# Patient Record
Sex: Male | Born: 1940 | Race: White | Hispanic: No | Marital: Married | Smoking: Former smoker
Health system: Southern US, Community
[De-identification: ages and names within clinical notes are randomized; demographics above are authoritative.]

## PROBLEM LIST (undated history)

## (undated) DIAGNOSIS — M1990 Unspecified osteoarthritis, unspecified site: Secondary | ICD-10-CM

## (undated) DIAGNOSIS — J189 Pneumonia, unspecified organism: Secondary | ICD-10-CM

## (undated) DIAGNOSIS — E119 Type 2 diabetes mellitus without complications: Secondary | ICD-10-CM

## (undated) DIAGNOSIS — I10 Essential (primary) hypertension: Secondary | ICD-10-CM

## (undated) DIAGNOSIS — M1612 Unilateral primary osteoarthritis, left hip: Secondary | ICD-10-CM

## (undated) DIAGNOSIS — M26609 Unspecified temporomandibular joint disorder, unspecified side: Secondary | ICD-10-CM

## (undated) HISTORY — PX: ROTATOR CUFF REPAIR: SHX139

## (undated) HISTORY — PX: OTHER SURGICAL HISTORY: SHX169

---
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
## 1998-01-18 ENCOUNTER — Ambulatory Visit (HOSPITAL_BASED_OUTPATIENT_CLINIC_OR_DEPARTMENT_OTHER): Admission: RE | Admit: 1998-01-18 | Discharge: 1998-01-18 | Payer: Self-pay | Admitting: Orthopedic Surgery

## 1998-02-06 ENCOUNTER — Inpatient Hospital Stay (HOSPITAL_COMMUNITY): Admission: EM | Admit: 1998-02-06 | Discharge: 1998-02-08 | Payer: Self-pay | Admitting: Emergency Medicine

## 1999-11-16 ENCOUNTER — Ambulatory Visit (HOSPITAL_COMMUNITY): Admission: RE | Admit: 1999-11-16 | Discharge: 1999-11-16 | Payer: Self-pay | Admitting: *Deleted

## 2002-08-22 ENCOUNTER — Emergency Department (HOSPITAL_COMMUNITY): Admission: EM | Admit: 2002-08-22 | Discharge: 2002-08-22 | Payer: Self-pay | Admitting: Emergency Medicine

## 2004-11-07 ENCOUNTER — Ambulatory Visit (HOSPITAL_COMMUNITY): Admission: RE | Admit: 2004-11-07 | Discharge: 2004-11-07 | Payer: Self-pay | Admitting: Orthopedic Surgery

## 2005-01-09 ENCOUNTER — Ambulatory Visit (HOSPITAL_BASED_OUTPATIENT_CLINIC_OR_DEPARTMENT_OTHER): Admission: RE | Admit: 2005-01-09 | Discharge: 2005-01-09 | Payer: Self-pay | Admitting: Orthopedic Surgery

## 2008-10-18 ENCOUNTER — Ambulatory Visit (HOSPITAL_COMMUNITY): Admission: RE | Admit: 2008-10-18 | Discharge: 2008-10-18 | Payer: Self-pay | Admitting: *Deleted

## 2008-10-18 ENCOUNTER — Encounter (INDEPENDENT_AMBULATORY_CARE_PROVIDER_SITE_OTHER): Payer: Self-pay | Admitting: *Deleted

## 2010-01-24 ENCOUNTER — Encounter: Admission: RE | Admit: 2010-01-24 | Discharge: 2010-01-24 | Payer: Self-pay | Admitting: Internal Medicine

## 2010-04-04 ENCOUNTER — Ambulatory Visit (HOSPITAL_BASED_OUTPATIENT_CLINIC_OR_DEPARTMENT_OTHER): Admission: RE | Admit: 2010-04-04 | Discharge: 2010-04-04 | Payer: Self-pay | Admitting: Urology

## 2010-11-10 LAB — POCT I-STAT 4, (NA,K, GLUC, HGB,HCT)
Glucose, Bld: 168 mg/dL — ABNORMAL HIGH (ref 70–99)
Hemoglobin: 14.6 g/dL (ref 13.0–17.0)

## 2010-12-12 LAB — GLUCOSE, CAPILLARY: Glucose-Capillary: 152 mg/dL — ABNORMAL HIGH (ref 70–99)

## 2011-01-09 NOTE — Op Note (Signed)
NAMEAVID, Eduardo Murray              ACCOUNT NO.:  [id number]   MEDICAL RECORD NO.:  [id number]          PATIENT TYPE:  AMB   LOCATION:  ENDO                         FACILITY:  [facility]   PHYSICIAN:  Georgiana Spinner, M.D.    DATE OF BIRTH:  [birth year]   DATE OF PROCEDURE:  10/18/2008  DATE OF DISCHARGE:                               OPERATIVE REPORT   PROCEDURE:  Upper endoscopy with biopsy.   INDICATIONS:  Abdominal pain.   ANESTHESIA:  Fentanyl 75 mcg, Versed 5 mg.   PROCEDURE:  With the patient mildly sedated in the left lateral  decubitus position, the Pentax videoscopic endoscope was inserted into  the mouth and passed under direct vision through the esophagus; which  appeared normal until we reached distal esophagus. There were changes of  esophagitis, with at least 3 linear ulcers with whitish bases and  surrounding erythema.  As we went further there appeared there might be  Barrett's esophagus distally.  Photographs and multiple biopsies were  taken.  We entered into the stomach; fundus, body, antrum, duodenal  bulb, second portion of the duodenum were visualized.  From this point  the endoscope was slowly withdrawn, taking circumferential views of the  duodenal mucosa, until the endoscope had been pulled back and the  stomach placed in retroflexion to view the stomach from below.  The  endoscope was straightened and withdrawn, taking circumferential views  of the remaining gastric and esophageal mucosa.  The patient's vital  signs and pulse oximetry remained stable.  The patient tolerated the  procedure well without apparent complications.   FINDINGS:  Fairly severe esophagitis, as photographed and biopsied.  A  question of Barrett's esophagus present as well.   PLAN:  Continue the patient on PPI therapy.  Does register that there  has been some improvement in his symptomatology, and await biopsy  report.  The patient will call me for results and follow-up with me as  an  outpatient.           ______________________________  Georgiana Spinner, M.D.     GMO/MEDQ  D:  10/18/2008  T:  10/18/2008  Job:  [id number]

## 2011-01-12 NOTE — Procedures (Signed)
[place]  Patient:    JAYVON, MOUNGER                     MRN: [id number] Proc. Date: 11/16/99 Adm. Date:  [id number] Attending:  Sabino Gasser                           Procedure Report  PROCEDURE PERFORMED:  Colonoscopy.  ENDOSCOPIST:  Sabino Gasser, M.D.  INDICATIONS FOR PROCEDURE:  Mr. Ivey is a 70 year old gentleman undergoing colonoscopy for follow-up of colon polyp disease.  ANESTHESIA:  Demerol 100 mg, Versed 10 mg was given intravenously in divided dose.  DESCRIPTION OF PROCEDURE:  With the patient mildly sedated in the left lateral decubitus position, rolled onto his back to attain the cecum, the Olympus videoscopic colonoscope was inserted in the rectum after normal rectal exam and  passed under direct vision into the cecum.  The cecum was identified by the ileocecal valve and appendiceal orifice, both of which were photographed.  From  this point, the colonoscope was slowly withdrawn, taking circumferential views f the entire colonic mucosa stopping only in the rectum which appeared normal on direct and retroflex view.  The endoscope was straightened and withdrawn. Patients vital signs and pulse oximeter remained stable.  The patient tolerated  the procedure well and without apparent complications.  FINDINGS: Essentially negative colonoscopic examination to the cecum.  PLAN:  Repeat examination in approximately five years. DD:  11/16/99 TD:  11/16/99 Job: [id number]

## 2011-01-12 NOTE — Op Note (Signed)
Eduardo Murray, Eduardo Murray              ACCOUNT NO.:  [id number]   MEDICAL RECORD NO.:  [id number]          PATIENT TYPE:  AMB   LOCATION:  DSC                          FACILITY:  [facility]   PHYSICIAN:  Katy Fitch. Sypher, M.D. 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   POSTOPERATIVE DIAGNOSIS:  1.  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  2.  Arthroscopic debridement of adhesive capsulitis with anterior      capsulectomy and debridement of granulation tissues.  3.  Arthroscopic debridement of partial-thickness rotator cuff tear      identified to be at least 75 to 80% thickness of the anterior      supraspinatus posterior to the rotator interval.  4.  Significant AC degenerative arthritis with chronic sub AC joint      impingement with open resection of medial acromion and open distal      clavicle resection i.e. Mumford procedure.  5.  Open reconstruction of the supraspinatus 75% deep surface rotator cuff      tear.   SURGEON:  Katy Fitch. Sypher, M.D.   ASSISTANT:  Jonni Sanger, P.A.   ANESTHESIA:  General endotracheal, supervising anesthesiologist is Sheldon Silvan, M.D. Note that Dr. Ivin Booty placed a preoperative interscalene block for  postoperative analgesia.   INDICATIONS:  Eduardo Murray is a 70 year old well-known patient with a  history of chronic left shoulder pain. Clinical examination revealed signs  of chronic stage II or III impingement, AC arthropathy and plain film  evidence of a probable rotator cuff tear.   An MRI of the shoulder documented significant AC arthropathy, a prominent  anterior medial  acromion, and signs of a 75% plus rotator cuff tear on the  deep surface of the supraspinatus with retraction of the deep fibers.   Due to chronic pain and a failure to improve with a therapy, Eduardo Murray is  brought to the operating room at this time for evaluation of shoulder under  anesthesia, diagnostic arthroscopy, anticipated subacromial decompression  and distal clavicle resection, and probable repair the rotator cuff.   Should he be found to have adhesive capsulitis, we will perform a  debridement of his granulation tissues and a capsulectomy.   After informed consent he is brought to the operating room at this time.   Preoperatively he was interviewed by Dr. Ivin Booty and a preoperative  interscalene block was placed for postoperative analgesia.   DESCRIPTION OF PROCEDURE:  Eduardo Murray is brought to the operating room  and placed in supine position on the operating table.   Following the induction of general endotracheal anesthesia, he was carefully  positioned in the beach-chair position with aid of a torso and head holder  designed for shoulder arthroscopy. The entire left arm and forequarter were  prepped with DuraPrep and draped with impervious arthroscopy drapes. The  procedure commenced with examination of left shoulder under anesthesia. Mr.  Murray had elevation of his left shoulder to 160 degrees, external rotation  of 70 degrees, and internal rotation of 40 degrees. With gentle manipulation  his range of motion was increased to elevation 175 degrees, external  rotation 90 degrees at 90 degrees abduction, and internal rotation of 65  degrees.   The shoulder was then distended with 20 mL of sterile saline with a spinal  needle anteriorly followed by placement of the arthroscope through a  standard posterior viewing portal with blunt technique. Diagnostic  arthroscopy revealed abundant granulation within the joint consistent with  adhesive capsulitis.   These were  documented with a digital camera followed by creation of the  anterior portal to perform extensive debridement of the granulations as well  as anterior capsulectomy and tenolysis of the subscapularis tendon.   There were considerable mature adhesions of the anterior glenohumeral  ligaments predominantly between the anterior-superior and the anterior  middle glenohumeral ligaments.   The deep surface of the rotator cuff was inspected and found to have a 75%  thickness retracted deep surface tear of the supraspinatus. The  infraspinatus and teres minor appeared normal. Subscapularis had some minor  fraying that was debrided. The biceps origin was normal. Minimal  degenerative changes of labrum were debrided. The deep surface fragments of  the rotator cuff were debrided and the tear documented with a digital  camera.   The arthroscope was removed from the glenohumeral joint and placed in  subacromial space. Subacromial bursitis was debrided followed by  visualization of the prominent AC joint osteophytes.   At this point we elected to proceed directly to open reconstruction of the  cuff with resection of the prominent distal clavicle and medial acromion.  Acromioplasty followed by bursectomy and repair of the rotator cuff.   The supraspinatus was elevated with an L-shaped incision and the necrotic  deep surface tendon debrided. The greater tuberosity was decorticated over a  distance of 3 cm in width and approximately 1.5 cm from lateral to medial.  The profile of the greater tuberosity was lowered approximately 3 mm to  bleeding cancellus bone. A single 5.5 mm Bio corkscrew anchor was placed  with #2 FiberWire placing two medial mattress sutures. A #2 FiberWire  McLaughlin suture was then used through bone to inset the supraspinatus and  advance it laterally. The Bio corkscrew mattress sutures were used to create a broad footprint across the entire decorticated greater tuberosity. The   margin of the repair was finished with a running suture of 0 Vicryl.   The subacromial space was then carefully inspected and bleeding points  electrocauterized with the Bovie cautery. The bursa was irrigated with  sterile saline followed by reconstruction of the anterior deltoid to the  trapezius closing the dead space created by distal clavicle resection and  repair of the anterior third of the deltoid to the periosteum of the  acromion with multiple mattress sutures of #2 FiberWire.   Subcutaneous tissues then repaired with 0 Vicryl followed by repair of the  skin with subdermal sutures of 3-0 Vicryl and intradermal 3-0 Prolene.   There were no apparent complications.   Eduardo Murray tolerated surgery and anesthesia well. He we was transferred to  recovery room with stable vital signs. We will admit him to recovery care  center for observation of vital signs and appropriate analgesics in the  form  of IV PCA morphine p.o. and IV Dilaudid as well as Ancef 1 gram IV q.8 hours  as prophylactic antibiotic.      RVS/MEDQ  D:  01/09/2005  T:  01/09/2005  Job:  [id number]

## 2011-03-24 ENCOUNTER — Inpatient Hospital Stay (INDEPENDENT_AMBULATORY_CARE_PROVIDER_SITE_OTHER)
Admission: RE | Admit: 2011-03-24 | Discharge: 2011-03-24 | Disposition: A | Discharge status: Home / Self Care | Payer: Medicare Other | Source: Ambulatory Visit | Attending: Family Medicine | Admitting: Family Medicine

## 2011-03-24 DIAGNOSIS — H6120 Impacted cerumen, unspecified ear: Secondary | ICD-10-CM

## 2012-11-24 ENCOUNTER — Emergency Department (HOSPITAL_COMMUNITY)
Admission: EM | Admit: 2012-11-24 | Discharge: 2012-11-24 | Disposition: A | Discharge status: Home / Self Care | Payer: No Typology Code available for payment source | Attending: Emergency Medicine | Admitting: Emergency Medicine

## 2012-11-24 ENCOUNTER — Emergency Department (HOSPITAL_COMMUNITY): Payer: No Typology Code available for payment source

## 2012-11-24 ENCOUNTER — Encounter (HOSPITAL_COMMUNITY): Payer: Self-pay | Admitting: *Deleted

## 2012-11-24 DIAGNOSIS — I10 Essential (primary) hypertension: Secondary | ICD-10-CM | POA: Insufficient documentation

## 2012-11-24 DIAGNOSIS — Y9389 Activity, other specified: Secondary | ICD-10-CM | POA: Insufficient documentation

## 2012-11-24 DIAGNOSIS — S2220XA Unspecified fracture of sternum, initial encounter for closed fracture: Secondary | ICD-10-CM | POA: Insufficient documentation

## 2012-11-24 DIAGNOSIS — Y92410 Unspecified street and highway as the place of occurrence of the external cause: Secondary | ICD-10-CM | POA: Insufficient documentation

## 2012-11-24 DIAGNOSIS — E119 Type 2 diabetes mellitus without complications: Secondary | ICD-10-CM | POA: Insufficient documentation

## 2012-11-24 HISTORY — DX: Essential (primary) hypertension: I10

## 2012-11-24 HISTORY — DX: Type 2 diabetes mellitus without complications: E11.9

## 2012-11-24 LAB — POCT I-STAT, CHEM 8
BUN: 18 mg/dL (ref 6–23)
Chloride: 102 mEq/L (ref 96–112)
Creatinine, Ser: 0.9 mg/dL (ref 0.50–1.35)
Glucose, Bld: 349 mg/dL — ABNORMAL HIGH (ref 70–99)
HCT: 43 % (ref 39.0–52.0)
Hemoglobin: 14.6 g/dL (ref 13.0–17.0)
Potassium: 4.7 mEq/L (ref 3.5–5.1)
Sodium: 138 mEq/L (ref 135–145)

## 2012-11-24 LAB — POCT I-STAT TROPONIN I: Troponin i, poc: 0 ng/mL (ref 0.00–0.08)

## 2012-11-24 LAB — CBC
Hemoglobin: 13.6 g/dL (ref 13.0–17.0)
Platelets: 137 10*3/uL — ABNORMAL LOW (ref 150–400)
RDW: 13.4 % (ref 11.5–15.5)

## 2012-11-24 MED ORDER — OXYCODONE-ACETAMINOPHEN 5-325 MG PO TABS
2.0000 | ORAL_TABLET | Freq: Once | ORAL | Status: AC
  Administered 2012-11-24: 2 via ORAL
  Filled 2012-11-24: qty 2

## 2012-11-24 MED ORDER — OXYCODONE-ACETAMINOPHEN 5-325 MG PO TABS: 1.0000 | ORAL_TABLET | ORAL | Status: DC | PRN

## 2012-11-24 NOTE — ED Notes (Signed)
Pt c/o pain in mid chest with palpation, or breathing.  Also st's painful in chest to move arms.  Pt denies any other injuries.

## 2012-11-24 NOTE — ED Notes (Signed)
Pain med given for c/o 10/10 pain.  Splint pillow and incentive spirometer given to patient.  Instructions given and demonstrated back to this nurse.

## 2012-11-24 NOTE — ED Notes (Signed)
Discussed that pain medication can cause drowniness and sleepiness so don't take it when performing activities that requires alertness. Also the importance of making follow up appointments with doctors following discharge.
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
## 2012-11-26 NOTE — ED Provider Notes (Signed)
Medical screening examination/treatment/procedure(s) were performed by non-physician practitioner and as supervising physician I was immediately available for consultation/collaboration.  Toy Baker, MD 11/26/12 [phone]

## 2012-12-02 ENCOUNTER — Institutional Professional Consult (permissible substitution) (INDEPENDENT_AMBULATORY_CARE_PROVIDER_SITE_OTHER): Payer: Medicare Other | Admitting: Thoracic Surgery (Cardiothoracic Vascular Surgery)

## 2012-12-02 ENCOUNTER — Encounter: Payer: Self-pay | Admitting: Thoracic Surgery (Cardiothoracic Vascular Surgery)

## 2012-12-02 VITALS — BP 145/86 | HR 101 | Resp 16 | Ht 69.0 in | Wt 208.0 lb

## 2012-12-02 DIAGNOSIS — S2220XA Unspecified fracture of sternum, initial encounter for closed fracture: Secondary | ICD-10-CM
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
## 2012-12-23 ENCOUNTER — Ambulatory Visit: Payer: Medicare Other | Admitting: Thoracic Surgery (Cardiothoracic Vascular Surgery)

## 2012-12-26 ENCOUNTER — Encounter: Payer: Self-pay | Admitting: Thoracic Surgery (Cardiothoracic Vascular Surgery)

## 2012-12-26 ENCOUNTER — Ambulatory Visit (INDEPENDENT_AMBULATORY_CARE_PROVIDER_SITE_OTHER): Payer: Medicare Other | Admitting: Thoracic Surgery (Cardiothoracic Vascular Surgery)

## 2012-12-26 VITALS — BP 132/84 | HR 104 | Resp 16 | Ht 69.0 in | Wt 208.0 lb

## 2012-12-26 DIAGNOSIS — S2220XD Unspecified fracture of sternum, subsequent encounter for fracture with routine healing: Secondary | ICD-10-CM

## 2012-12-26 DIAGNOSIS — IMO0001 Reserved for inherently not codable concepts without codable children: Secondary | ICD-10-CM
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
## 2013-02-02 DIAGNOSIS — Z0279 Encounter for issue of other medical certificate: Secondary | ICD-10-CM

## 2014-03-12 ENCOUNTER — Encounter (INDEPENDENT_AMBULATORY_CARE_PROVIDER_SITE_OTHER): Payer: Self-pay

## 2014-03-12 ENCOUNTER — Ambulatory Visit (INDEPENDENT_AMBULATORY_CARE_PROVIDER_SITE_OTHER): Payer: Medicare Other | Admitting: Cardiovascular Disease

## 2014-03-12 ENCOUNTER — Encounter: Payer: Self-pay | Admitting: Cardiovascular Disease

## 2014-03-12 VITALS — BP 130/70 | HR 60 | Ht 69.0 in | Wt 212.8 lb

## 2014-03-12 DIAGNOSIS — R079 Chest pain, unspecified: Secondary | ICD-10-CM | POA: Insufficient documentation

## 2014-03-12 DIAGNOSIS — IMO0002 Reserved for concepts with insufficient information to code with codable children: Secondary | ICD-10-CM

## 2014-03-12 DIAGNOSIS — S2220XS Unspecified fracture of sternum, sequela: Secondary | ICD-10-CM

## 2014-03-12 DIAGNOSIS — I4510 Unspecified right bundle-branch block: Secondary | ICD-10-CM | POA: Insufficient documentation

## 2014-03-12 DIAGNOSIS — S2220XA Unspecified fracture of sternum, initial encounter for closed fracture: Secondary | ICD-10-CM | POA: Insufficient documentation

## 2014-03-12 DIAGNOSIS — E119 Type 2 diabetes mellitus without complications: Secondary | ICD-10-CM | POA: Insufficient documentation

## 2014-03-12 NOTE — Assessment & Plan Note (Signed)
We have encouraged continued exercise, careful diet management in an effort to lose weight. He reports that he is part of a study

## 2014-03-12 NOTE — Assessment & Plan Note (Signed)
Pain is much improved from last year. Currently does not need pain medication
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
## 2014-03-12 NOTE — Patient Instructions (Signed)
You are doing well. No medication changes were made.  Please call us if you have new issues that need to be addressed before your next appt.    

## 2014-03-12 NOTE — Assessment & Plan Note (Signed)
Relatively benign finding. Seen on prior EKGs dating back to early 2014. Asymptomatic. No further workup at this time

## 2014-05-04 ENCOUNTER — Ambulatory Visit: Payer: Medicare Other | Admitting: Cardiology

## 2014-05-06 ENCOUNTER — Ambulatory Visit: Payer: Medicare Other | Admitting: Cardiology

## 2014-11-02 DIAGNOSIS — N451 Epididymitis: Secondary | ICD-10-CM | POA: Diagnosis not present

## 2015-02-16 DIAGNOSIS — M542 Cervicalgia: Secondary | ICD-10-CM | POA: Diagnosis not present

## 2015-09-01 DIAGNOSIS — M1712 Unilateral primary osteoarthritis, left knee: Secondary | ICD-10-CM | POA: Diagnosis not present

## 2015-09-01 DIAGNOSIS — M179 Osteoarthritis of knee, unspecified: Secondary | ICD-10-CM | POA: Diagnosis not present

## 2015-09-05 DIAGNOSIS — M1712 Unilateral primary osteoarthritis, left knee: Secondary | ICD-10-CM | POA: Diagnosis not present

## 2015-09-05 DIAGNOSIS — M25569 Pain in unspecified knee: Secondary | ICD-10-CM | POA: Diagnosis not present

## 2015-09-30 DIAGNOSIS — I10 Essential (primary) hypertension: Secondary | ICD-10-CM | POA: Diagnosis not present

## 2015-09-30 DIAGNOSIS — E7800 Pure hypercholesterolemia, unspecified: Secondary | ICD-10-CM | POA: Diagnosis not present

## 2015-09-30 DIAGNOSIS — Z0000 Encounter for general adult medical examination without abnormal findings: Secondary | ICD-10-CM | POA: Diagnosis not present

## 2015-09-30 DIAGNOSIS — E1165 Type 2 diabetes mellitus with hyperglycemia: Secondary | ICD-10-CM | POA: Diagnosis not present

## 2015-10-07 ENCOUNTER — Other Ambulatory Visit: Payer: Self-pay | Admitting: Internal Medicine

## 2015-10-07 DIAGNOSIS — E1121 Type 2 diabetes mellitus with diabetic nephropathy: Secondary | ICD-10-CM | POA: Diagnosis not present

## 2015-10-07 DIAGNOSIS — I714 Abdominal aortic aneurysm, without rupture, unspecified: Secondary | ICD-10-CM

## 2015-10-07 DIAGNOSIS — I10 Essential (primary) hypertension: Secondary | ICD-10-CM | POA: Diagnosis not present

## 2015-10-07 DIAGNOSIS — Z0000 Encounter for general adult medical examination without abnormal findings: Secondary | ICD-10-CM | POA: Diagnosis not present

## 2015-10-10 DIAGNOSIS — I10 Essential (primary) hypertension: Secondary | ICD-10-CM | POA: Diagnosis not present

## 2015-10-10 DIAGNOSIS — E1165 Type 2 diabetes mellitus with hyperglycemia: Secondary | ICD-10-CM | POA: Diagnosis not present

## 2015-10-17 DIAGNOSIS — E1165 Type 2 diabetes mellitus with hyperglycemia: Secondary | ICD-10-CM | POA: Diagnosis not present

## 2015-10-21 ENCOUNTER — Ambulatory Visit
Admission: RE | Admit: 2015-10-21 | Discharge: 2015-10-21 | Disposition: A | Discharge status: Home / Self Care | Payer: Medicare Other | Source: Ambulatory Visit | Attending: Internal Medicine | Admitting: Internal Medicine

## 2015-10-21 DIAGNOSIS — I714 Abdominal aortic aneurysm, without rupture, unspecified: Secondary | ICD-10-CM

## 2015-10-21 DIAGNOSIS — Z136 Encounter for screening for cardiovascular disorders: Secondary | ICD-10-CM | POA: Diagnosis not present

## 2015-10-24 DIAGNOSIS — E1165 Type 2 diabetes mellitus with hyperglycemia: Secondary | ICD-10-CM | POA: Diagnosis not present

## 2015-11-01 DIAGNOSIS — E113292 Type 2 diabetes mellitus with mild nonproliferative diabetic retinopathy without macular edema, left eye: Secondary | ICD-10-CM | POA: Diagnosis not present

## 2015-11-01 DIAGNOSIS — H524 Presbyopia: Secondary | ICD-10-CM | POA: Diagnosis not present

## 2015-11-01 DIAGNOSIS — H2513 Age-related nuclear cataract, bilateral: Secondary | ICD-10-CM | POA: Diagnosis not present

## 2015-11-01 DIAGNOSIS — H52223 Regular astigmatism, bilateral: Secondary | ICD-10-CM | POA: Diagnosis not present

## 2015-12-20 DIAGNOSIS — E1165 Type 2 diabetes mellitus with hyperglycemia: Secondary | ICD-10-CM | POA: Diagnosis not present

## 2015-12-22 DIAGNOSIS — E1121 Type 2 diabetes mellitus with diabetic nephropathy: Secondary | ICD-10-CM | POA: Diagnosis not present

## 2016-01-02 DIAGNOSIS — M1711 Unilateral primary osteoarthritis, right knee: Secondary | ICD-10-CM | POA: Diagnosis not present

## 2016-01-02 DIAGNOSIS — M25551 Pain in right hip: Secondary | ICD-10-CM | POA: Diagnosis not present

## 2016-01-02 DIAGNOSIS — M545 Low back pain: Secondary | ICD-10-CM | POA: Diagnosis not present

## 2016-03-20 DIAGNOSIS — E1121 Type 2 diabetes mellitus with diabetic nephropathy: Secondary | ICD-10-CM | POA: Diagnosis not present

## 2016-03-22 DIAGNOSIS — I10 Essential (primary) hypertension: Secondary | ICD-10-CM | POA: Diagnosis not present

## 2016-03-22 DIAGNOSIS — E1165 Type 2 diabetes mellitus with hyperglycemia: Secondary | ICD-10-CM | POA: Diagnosis not present

## 2016-03-22 DIAGNOSIS — M792 Neuralgia and neuritis, unspecified: Secondary | ICD-10-CM | POA: Diagnosis not present

## 2016-04-05 DIAGNOSIS — M792 Neuralgia and neuritis, unspecified: Secondary | ICD-10-CM | POA: Diagnosis not present

## 2016-04-05 DIAGNOSIS — Z7982 Long term (current) use of aspirin: Secondary | ICD-10-CM | POA: Diagnosis not present

## 2016-04-05 DIAGNOSIS — E1165 Type 2 diabetes mellitus with hyperglycemia: Secondary | ICD-10-CM | POA: Diagnosis not present

## 2016-04-05 DIAGNOSIS — I10 Essential (primary) hypertension: Secondary | ICD-10-CM | POA: Diagnosis not present

## 2016-06-25 DIAGNOSIS — E7800 Pure hypercholesterolemia, unspecified: Secondary | ICD-10-CM | POA: Diagnosis not present

## 2016-06-25 DIAGNOSIS — E119 Type 2 diabetes mellitus without complications: Secondary | ICD-10-CM | POA: Diagnosis not present

## 2016-06-25 DIAGNOSIS — E1165 Type 2 diabetes mellitus with hyperglycemia: Secondary | ICD-10-CM | POA: Diagnosis not present

## 2016-06-25 DIAGNOSIS — I10 Essential (primary) hypertension: Secondary | ICD-10-CM | POA: Diagnosis not present

## 2016-08-06 DIAGNOSIS — I10 Essential (primary) hypertension: Secondary | ICD-10-CM | POA: Diagnosis not present

## 2016-08-06 DIAGNOSIS — E1165 Type 2 diabetes mellitus with hyperglycemia: Secondary | ICD-10-CM | POA: Diagnosis not present

## 2016-08-23 DIAGNOSIS — I10 Essential (primary) hypertension: Secondary | ICD-10-CM | POA: Diagnosis not present

## 2016-10-11 DIAGNOSIS — M25431 Effusion, right wrist: Secondary | ICD-10-CM | POA: Diagnosis not present

## 2016-10-11 DIAGNOSIS — E1165 Type 2 diabetes mellitus with hyperglycemia: Secondary | ICD-10-CM | POA: Diagnosis not present

## 2016-10-11 DIAGNOSIS — I10 Essential (primary) hypertension: Secondary | ICD-10-CM | POA: Diagnosis not present

## 2016-10-11 DIAGNOSIS — M2550 Pain in unspecified joint: Secondary | ICD-10-CM | POA: Diagnosis not present

## 2016-10-11 DIAGNOSIS — Z125 Encounter for screening for malignant neoplasm of prostate: Secondary | ICD-10-CM | POA: Diagnosis not present

## 2016-10-11 DIAGNOSIS — Z0000 Encounter for general adult medical examination without abnormal findings: Secondary | ICD-10-CM | POA: Diagnosis not present

## 2016-10-11 DIAGNOSIS — M25531 Pain in right wrist: Secondary | ICD-10-CM | POA: Diagnosis not present

## 2016-10-11 DIAGNOSIS — Z7982 Long term (current) use of aspirin: Secondary | ICD-10-CM | POA: Diagnosis not present

## 2016-10-16 DIAGNOSIS — Z0001 Encounter for general adult medical examination with abnormal findings: Secondary | ICD-10-CM | POA: Diagnosis not present

## 2016-10-16 DIAGNOSIS — Z1212 Encounter for screening for malignant neoplasm of rectum: Secondary | ICD-10-CM | POA: Diagnosis not present

## 2016-10-16 DIAGNOSIS — I10 Essential (primary) hypertension: Secondary | ICD-10-CM | POA: Diagnosis not present

## 2016-10-16 DIAGNOSIS — E7800 Pure hypercholesterolemia, unspecified: Secondary | ICD-10-CM | POA: Diagnosis not present

## 2016-10-22 DIAGNOSIS — E119 Type 2 diabetes mellitus without complications: Secondary | ICD-10-CM | POA: Diagnosis not present

## 2016-10-22 DIAGNOSIS — E7800 Pure hypercholesterolemia, unspecified: Secondary | ICD-10-CM | POA: Diagnosis not present

## 2016-10-22 DIAGNOSIS — I10 Essential (primary) hypertension: Secondary | ICD-10-CM | POA: Diagnosis not present

## 2016-11-06 ENCOUNTER — Other Ambulatory Visit: Payer: Self-pay | Admitting: Internal Medicine

## 2016-11-06 DIAGNOSIS — I714 Abdominal aortic aneurysm, without rupture, unspecified: Secondary | ICD-10-CM

## 2016-11-13 ENCOUNTER — Ambulatory Visit
Admission: RE | Admit: 2016-11-13 | Discharge: 2016-11-13 | Disposition: A | Discharge status: Home / Self Care | Payer: Medicare Other | Source: Ambulatory Visit | Attending: Internal Medicine | Admitting: Internal Medicine

## 2016-11-13 DIAGNOSIS — I714 Abdominal aortic aneurysm, without rupture, unspecified: Secondary | ICD-10-CM

## 2016-11-13 DIAGNOSIS — I77811 Abdominal aortic ectasia: Secondary | ICD-10-CM | POA: Diagnosis not present

## 2017-04-15 DIAGNOSIS — R972 Elevated prostate specific antigen [PSA]: Secondary | ICD-10-CM | POA: Diagnosis not present

## 2017-04-18 DIAGNOSIS — E1121 Type 2 diabetes mellitus with diabetic nephropathy: Secondary | ICD-10-CM | POA: Diagnosis not present

## 2017-04-18 DIAGNOSIS — I10 Essential (primary) hypertension: Secondary | ICD-10-CM | POA: Diagnosis not present

## 2017-04-18 DIAGNOSIS — M79641 Pain in right hand: Secondary | ICD-10-CM | POA: Diagnosis not present

## 2017-04-25 DIAGNOSIS — E1165 Type 2 diabetes mellitus with hyperglycemia: Secondary | ICD-10-CM | POA: Diagnosis not present

## 2017-06-17 DIAGNOSIS — E1165 Type 2 diabetes mellitus with hyperglycemia: Secondary | ICD-10-CM | POA: Diagnosis not present

## 2017-06-24 DIAGNOSIS — E1165 Type 2 diabetes mellitus with hyperglycemia: Secondary | ICD-10-CM | POA: Diagnosis not present

## 2017-06-24 DIAGNOSIS — E7800 Pure hypercholesterolemia, unspecified: Secondary | ICD-10-CM | POA: Diagnosis not present

## 2017-06-24 DIAGNOSIS — I10 Essential (primary) hypertension: Secondary | ICD-10-CM | POA: Diagnosis not present

## 2017-07-15 DIAGNOSIS — E1121 Type 2 diabetes mellitus with diabetic nephropathy: Secondary | ICD-10-CM | POA: Diagnosis not present

## 2017-07-15 DIAGNOSIS — I440 Atrioventricular block, first degree: Secondary | ICD-10-CM | POA: Diagnosis not present

## 2017-07-15 DIAGNOSIS — I4510 Unspecified right bundle-branch block: Secondary | ICD-10-CM | POA: Diagnosis not present

## 2017-07-15 DIAGNOSIS — I10 Essential (primary) hypertension: Secondary | ICD-10-CM | POA: Diagnosis not present

## 2017-07-15 DIAGNOSIS — E7800 Pure hypercholesterolemia, unspecified: Secondary | ICD-10-CM | POA: Diagnosis not present

## 2017-09-17 DIAGNOSIS — E1165 Type 2 diabetes mellitus with hyperglycemia: Secondary | ICD-10-CM | POA: Diagnosis not present

## 2017-09-23 DIAGNOSIS — H524 Presbyopia: Secondary | ICD-10-CM | POA: Diagnosis not present

## 2017-09-23 DIAGNOSIS — E113293 Type 2 diabetes mellitus with mild nonproliferative diabetic retinopathy without macular edema, bilateral: Secondary | ICD-10-CM | POA: Diagnosis not present

## 2017-09-23 DIAGNOSIS — H2513 Age-related nuclear cataract, bilateral: Secondary | ICD-10-CM | POA: Diagnosis not present

## 2017-09-23 DIAGNOSIS — H5203 Hypermetropia, bilateral: Secondary | ICD-10-CM | POA: Diagnosis not present

## 2017-09-24 DIAGNOSIS — I10 Essential (primary) hypertension: Secondary | ICD-10-CM | POA: Diagnosis not present

## 2017-09-24 DIAGNOSIS — E1165 Type 2 diabetes mellitus with hyperglycemia: Secondary | ICD-10-CM | POA: Diagnosis not present

## 2017-09-25 DIAGNOSIS — M25562 Pain in left knee: Secondary | ICD-10-CM | POA: Diagnosis not present

## 2017-09-25 DIAGNOSIS — M1712 Unilateral primary osteoarthritis, left knee: Secondary | ICD-10-CM | POA: Diagnosis not present

## 2017-10-09 DIAGNOSIS — E1121 Type 2 diabetes mellitus with diabetic nephropathy: Secondary | ICD-10-CM | POA: Diagnosis not present

## 2017-10-09 DIAGNOSIS — E1165 Type 2 diabetes mellitus with hyperglycemia: Secondary | ICD-10-CM | POA: Diagnosis not present

## 2017-10-09 DIAGNOSIS — E119 Type 2 diabetes mellitus without complications: Secondary | ICD-10-CM | POA: Diagnosis not present

## 2017-10-09 DIAGNOSIS — N400 Enlarged prostate without lower urinary tract symptoms: Secondary | ICD-10-CM | POA: Diagnosis not present

## 2017-10-09 DIAGNOSIS — M1712 Unilateral primary osteoarthritis, left knee: Secondary | ICD-10-CM | POA: Diagnosis not present

## 2017-10-09 DIAGNOSIS — I10 Essential (primary) hypertension: Secondary | ICD-10-CM | POA: Diagnosis not present

## 2017-10-17 DIAGNOSIS — E1165 Type 2 diabetes mellitus with hyperglycemia: Secondary | ICD-10-CM | POA: Diagnosis not present

## 2017-10-17 DIAGNOSIS — E7800 Pure hypercholesterolemia, unspecified: Secondary | ICD-10-CM | POA: Diagnosis not present

## 2017-10-17 DIAGNOSIS — I10 Essential (primary) hypertension: Secondary | ICD-10-CM | POA: Diagnosis not present

## 2017-10-22 DIAGNOSIS — E1121 Type 2 diabetes mellitus with diabetic nephropathy: Secondary | ICD-10-CM | POA: Diagnosis not present

## 2017-10-22 DIAGNOSIS — Z6830 Body mass index (BMI) 30.0-30.9, adult: Secondary | ICD-10-CM | POA: Diagnosis not present

## 2017-10-22 DIAGNOSIS — I714 Abdominal aortic aneurysm, without rupture: Secondary | ICD-10-CM | POA: Diagnosis not present

## 2017-10-22 DIAGNOSIS — E113292 Type 2 diabetes mellitus with mild nonproliferative diabetic retinopathy without macular edema, left eye: Secondary | ICD-10-CM | POA: Diagnosis not present

## 2017-10-22 DIAGNOSIS — Z0001 Encounter for general adult medical examination with abnormal findings: Secondary | ICD-10-CM | POA: Diagnosis not present

## 2017-10-22 DIAGNOSIS — I4510 Unspecified right bundle-branch block: Secondary | ICD-10-CM | POA: Diagnosis not present

## 2017-10-22 DIAGNOSIS — E1165 Type 2 diabetes mellitus with hyperglycemia: Secondary | ICD-10-CM | POA: Diagnosis not present

## 2017-10-22 DIAGNOSIS — I440 Atrioventricular block, first degree: Secondary | ICD-10-CM | POA: Diagnosis not present

## 2017-10-22 DIAGNOSIS — M792 Neuralgia and neuritis, unspecified: Secondary | ICD-10-CM | POA: Diagnosis not present

## 2017-10-22 DIAGNOSIS — I10 Essential (primary) hypertension: Secondary | ICD-10-CM | POA: Diagnosis not present

## 2017-10-22 DIAGNOSIS — E7800 Pure hypercholesterolemia, unspecified: Secondary | ICD-10-CM | POA: Diagnosis not present

## 2017-10-24 DIAGNOSIS — I729 Aneurysm of unspecified site: Secondary | ICD-10-CM | POA: Diagnosis not present

## 2017-10-24 DIAGNOSIS — I714 Abdominal aortic aneurysm, without rupture: Secondary | ICD-10-CM | POA: Diagnosis not present

## 2017-10-30 DIAGNOSIS — E1165 Type 2 diabetes mellitus with hyperglycemia: Secondary | ICD-10-CM | POA: Diagnosis not present

## 2017-10-30 DIAGNOSIS — E7800 Pure hypercholesterolemia, unspecified: Secondary | ICD-10-CM | POA: Diagnosis not present

## 2017-10-30 DIAGNOSIS — I10 Essential (primary) hypertension: Secondary | ICD-10-CM | POA: Diagnosis not present

## 2017-12-02 DIAGNOSIS — E1165 Type 2 diabetes mellitus with hyperglycemia: Secondary | ICD-10-CM | POA: Diagnosis not present

## 2017-12-02 DIAGNOSIS — I10 Essential (primary) hypertension: Secondary | ICD-10-CM | POA: Diagnosis not present

## 2017-12-02 DIAGNOSIS — E7800 Pure hypercholesterolemia, unspecified: Secondary | ICD-10-CM | POA: Diagnosis not present

## 2017-12-17 DIAGNOSIS — E118 Type 2 diabetes mellitus with unspecified complications: Secondary | ICD-10-CM | POA: Diagnosis not present

## 2017-12-17 DIAGNOSIS — E1165 Type 2 diabetes mellitus with hyperglycemia: Secondary | ICD-10-CM | POA: Diagnosis not present

## 2018-01-13 DIAGNOSIS — E7800 Pure hypercholesterolemia, unspecified: Secondary | ICD-10-CM | POA: Diagnosis not present

## 2018-01-13 DIAGNOSIS — E1165 Type 2 diabetes mellitus with hyperglycemia: Secondary | ICD-10-CM | POA: Diagnosis not present

## 2018-01-13 DIAGNOSIS — I10 Essential (primary) hypertension: Secondary | ICD-10-CM | POA: Diagnosis not present

## 2018-01-27 DIAGNOSIS — I10 Essential (primary) hypertension: Secondary | ICD-10-CM | POA: Diagnosis not present

## 2018-01-27 DIAGNOSIS — E1165 Type 2 diabetes mellitus with hyperglycemia: Secondary | ICD-10-CM | POA: Diagnosis not present

## 2018-01-27 DIAGNOSIS — E7800 Pure hypercholesterolemia, unspecified: Secondary | ICD-10-CM | POA: Diagnosis not present

## 2018-03-27 DIAGNOSIS — H35369 Drusen (degenerative) of macula, unspecified eye: Secondary | ICD-10-CM | POA: Diagnosis not present

## 2018-03-27 DIAGNOSIS — E113293 Type 2 diabetes mellitus with mild nonproliferative diabetic retinopathy without macular edema, bilateral: Secondary | ICD-10-CM | POA: Diagnosis not present

## 2018-03-27 DIAGNOSIS — H2513 Age-related nuclear cataract, bilateral: Secondary | ICD-10-CM | POA: Diagnosis not present

## 2018-07-16 DIAGNOSIS — G8929 Other chronic pain: Secondary | ICD-10-CM | POA: Diagnosis not present

## 2018-07-16 DIAGNOSIS — M1712 Unilateral primary osteoarthritis, left knee: Secondary | ICD-10-CM | POA: Diagnosis not present

## 2018-07-16 DIAGNOSIS — M25562 Pain in left knee: Secondary | ICD-10-CM | POA: Diagnosis not present

## 2018-09-23 DIAGNOSIS — M1612 Unilateral primary osteoarthritis, left hip: Secondary | ICD-10-CM | POA: Diagnosis not present

## 2018-09-23 DIAGNOSIS — M170 Bilateral primary osteoarthritis of knee: Secondary | ICD-10-CM | POA: Diagnosis not present

## 2018-10-23 DIAGNOSIS — I10 Essential (primary) hypertension: Secondary | ICD-10-CM | POA: Diagnosis not present

## 2018-10-23 DIAGNOSIS — Z125 Encounter for screening for malignant neoplasm of prostate: Secondary | ICD-10-CM | POA: Diagnosis not present

## 2018-10-23 DIAGNOSIS — E7800 Pure hypercholesterolemia, unspecified: Secondary | ICD-10-CM | POA: Diagnosis not present

## 2018-10-23 DIAGNOSIS — E1165 Type 2 diabetes mellitus with hyperglycemia: Secondary | ICD-10-CM | POA: Diagnosis not present

## 2018-10-27 DIAGNOSIS — N400 Enlarged prostate without lower urinary tract symptoms: Secondary | ICD-10-CM | POA: Diagnosis not present

## 2018-10-27 DIAGNOSIS — R972 Elevated prostate specific antigen [PSA]: Secondary | ICD-10-CM | POA: Diagnosis not present

## 2018-10-27 DIAGNOSIS — Z0001 Encounter for general adult medical examination with abnormal findings: Secondary | ICD-10-CM | POA: Diagnosis not present

## 2018-10-27 DIAGNOSIS — I440 Atrioventricular block, first degree: Secondary | ICD-10-CM | POA: Diagnosis not present

## 2018-10-27 DIAGNOSIS — I10 Essential (primary) hypertension: Secondary | ICD-10-CM | POA: Diagnosis not present

## 2018-10-27 DIAGNOSIS — E1121 Type 2 diabetes mellitus with diabetic nephropathy: Secondary | ICD-10-CM | POA: Diagnosis not present

## 2018-10-27 DIAGNOSIS — I4510 Unspecified right bundle-branch block: Secondary | ICD-10-CM | POA: Diagnosis not present

## 2018-10-27 DIAGNOSIS — E113292 Type 2 diabetes mellitus with mild nonproliferative diabetic retinopathy without macular edema, left eye: Secondary | ICD-10-CM | POA: Diagnosis not present

## 2018-10-27 DIAGNOSIS — E7800 Pure hypercholesterolemia, unspecified: Secondary | ICD-10-CM | POA: Diagnosis not present

## 2018-10-27 DIAGNOSIS — Z8701 Personal history of pneumonia (recurrent): Secondary | ICD-10-CM | POA: Diagnosis not present

## 2018-10-27 DIAGNOSIS — I714 Abdominal aortic aneurysm, without rupture: Secondary | ICD-10-CM | POA: Diagnosis not present

## 2018-10-30 DIAGNOSIS — I10 Essential (primary) hypertension: Secondary | ICD-10-CM | POA: Diagnosis not present

## 2018-10-30 DIAGNOSIS — E1165 Type 2 diabetes mellitus with hyperglycemia: Secondary | ICD-10-CM | POA: Diagnosis not present

## 2018-10-30 DIAGNOSIS — E7800 Pure hypercholesterolemia, unspecified: Secondary | ICD-10-CM | POA: Diagnosis not present

## 2018-10-30 DIAGNOSIS — Z6828 Body mass index (BMI) 28.0-28.9, adult: Secondary | ICD-10-CM | POA: Diagnosis not present

## 2018-11-03 DIAGNOSIS — M1612 Unilateral primary osteoarthritis, left hip: Secondary | ICD-10-CM | POA: Diagnosis not present

## 2018-11-13 DIAGNOSIS — Z6828 Body mass index (BMI) 28.0-28.9, adult: Secondary | ICD-10-CM | POA: Diagnosis not present

## 2018-11-13 DIAGNOSIS — E7800 Pure hypercholesterolemia, unspecified: Secondary | ICD-10-CM | POA: Diagnosis not present

## 2018-11-13 DIAGNOSIS — I10 Essential (primary) hypertension: Secondary | ICD-10-CM | POA: Diagnosis not present

## 2018-11-13 DIAGNOSIS — E1165 Type 2 diabetes mellitus with hyperglycemia: Secondary | ICD-10-CM | POA: Diagnosis not present

## 2018-12-04 DIAGNOSIS — I10 Essential (primary) hypertension: Secondary | ICD-10-CM | POA: Diagnosis not present

## 2018-12-04 DIAGNOSIS — E1165 Type 2 diabetes mellitus with hyperglycemia: Secondary | ICD-10-CM | POA: Diagnosis not present

## 2018-12-04 DIAGNOSIS — E083211 Diabetes mellitus due to underlying condition with mild nonproliferative diabetic retinopathy with macular edema, right eye: Secondary | ICD-10-CM | POA: Diagnosis not present

## 2018-12-04 DIAGNOSIS — E7800 Pure hypercholesterolemia, unspecified: Secondary | ICD-10-CM | POA: Diagnosis not present

## 2018-12-04 DIAGNOSIS — Z6828 Body mass index (BMI) 28.0-28.9, adult: Secondary | ICD-10-CM | POA: Diagnosis not present

## 2019-01-22 DIAGNOSIS — E1165 Type 2 diabetes mellitus with hyperglycemia: Secondary | ICD-10-CM | POA: Diagnosis not present

## 2019-03-12 ENCOUNTER — Other Ambulatory Visit (HOSPITAL_COMMUNITY): Payer: Self-pay | Admitting: *Deleted

## 2019-03-12 NOTE — Patient Instructions (Addendum)
YOU NEED TO HAVE A COVID 19 TEST ON_______ @_______ , THIS TEST MUST BE DONE BEFORE SURGERY, COME TO [facility] ENTRANCE. ONCE YOUR COVID TEST IS COMPLETED, PLEASE BEGIN THE QUARANTINE INSTRUCTIONS AS OUTLINED IN YOUR HANDOUT.                Eduardo Murray    Your procedure is scheduled on: 03-24-2019  Report to [facility] Main  Entrance  Report to admitting at 1115 AM      Call this number if you have problems the morning of surgery [phone]    Remember: Do not eat food :After Midnight. CLEAR LIQUIDS FROM MIDNIGHT UNTIL 745 AM. NOTHING BY MOUTH AFTER 745 AM.   BRUSH YOUR TEETH MORNING OF SURGERY AND RINSE YOUR MOUTH OUT, NO CHEWING GUM CANDY OR MINTS.     CLEAR LIQUID DIET   Foods Allowed                                                                     Foods Excluded  Coffee and tea, regular and decaf                             liquids that you cannot  Plain Jell-O any favor except red or purple                                           see through such as: Fruit ices (not with fruit pulp)                                     milk, soups, orange juice  Iced Popsicles                                    All solid food Carbonated beverages, regular and diet                                    Cranberry, grape and apple juices Sports drinks like Gatorade Lightly seasoned clear broth or consume(fat free) Sugar, honey syrup  Sample Menu Breakfast                                Lunch                                     Supper Cranberry juice                    Beef broth                            Chicken broth Jell-O  Grape juice                           Apple juice Coffee or tea                        Jell-O                                      Popsicle                                                Coffee or tea                        Coffee or  tea  _____________________________________________________________________     Take these medicines the morning of surgery with A SIP OF WATER: protonix  DO NOT TAKE ANY  ORAL  DIABETIC MEDICATIONS DAY OF YOUR SURGERY     How to Manage Your Diabetes Before and After Surgery  Why is it important to control my blood sugar before and after surgery? . Improving blood sugar levels before and after surgery helps healing and can limit problems. . A way of improving blood sugar control is eating a healthy diet by: o  Eating less sugar and carbohydrates o  Increasing activity/exercise o  Talking with your doctor about reaching your blood sugar goals . High blood sugars (greater than 180 mg/dL) can raise your risk of infections and slow your recovery, so you will need to focus on controlling your diabetes during the weeks before surgery. . Make sure that the doctor who takes care of your diabetes knows about your planned surgery including the date and location.  How do I manage my blood sugar before surgery? . Check your blood sugar at least 4 times a day, starting 2 days before surgery, to make sure that the level is not too high or low. o Check your blood sugar the morning of your surgery when you wake up and every 2 hours until you get to the Short Stay unit. . If your blood sugar is less than 70 mg/dL, you will need to treat for low blood sugar: o Do not take insulin. o Treat a low blood sugar (less than 70 mg/dL) with  cup of clear juice (cranberry or apple), 4 glucose tablets, OR glucose gel. o Recheck blood sugar in 15 minutes after treatment (to make sure it is greater than 70 mg/dL). If your blood sugar is not greater than 70 mg/dL on recheck, call [phone] for further instructions. . Report your blood sugar to the short stay nurse when you get to Short Stay.  . If you are admitted to the hospital after surgery: o Your blood sugar will be checked by the staff and you will probably  be given insulin after surgery (instead of oral diabetes medicines) to make sure you have good blood sugar levels. o The goal for blood sugar control after surgery is 80-180 mg/dL.   WHAT DO I DO ABOUT MY DIABETES MEDICATION?  .  TAKE TRULICITY AS USUAL the day before surgery .  None   The  day of surgery  . THE DAY BEFORE SURGERY TAKE METFORMIN AS  USUAL.Marland Kitchen NO METFORMIN DAY OF SURGERY      . THE MORNING  BEFORE SURGERY TAKE TOUJEO AS USUAL ( TAKE 20 UNITS).  .  THE DAY OF SURGERY TAKE 1/2 DOSE OF TOUJEO (TAKE 10 UNITS)   Reviewed and Endorsed by [place] Patient Education Committee, August 2015                            You may not have any metal on your body including hair pins and              piercings  Do not wear jewelry,  lotions, powders or perfumes, deodorant                          Men may shave face and neck.   Do not bring valuables to the hospital. [place].  Contacts, dentures or bridgework may not be worn into surgery.  ____________________________________________________________________            [name] - Preparing for Surgery Before surgery, you can play an important role.  Because skin is not sterile, your skin needs to be as free of germs as possible.  You can reduce the number of germs on your skin by washing with CHG (chlorahexidine gluconate) soap before surgery.  CHG is an antiseptic cleaner which kills germs and bonds with the skin to continue killing germs even after washing. Please DO NOT use if you have an allergy to CHG or antibacterial soaps.  If your skin becomes reddened/irritated stop using the CHG and inform your nurse when you arrive at Short Stay. Do not shave (including legs and underarms) for at least 48 hours prior to the first CHG shower.  You may shave your face/neck. Please follow these instructions carefully:  1.  Shower with CHG Soap the night before surgery and the  morning of  Surgery.  2.  If you choose to wash your hair, wash your hair first as usual with your  normal  shampoo.  3.  After you shampoo, rinse your hair and body thoroughly to remove the  shampoo.                           4.  Use CHG as you would any other liquid soap.  You can apply chg directly  to the skin and wash                       Gently with a scrungie or clean washcloth.  5.  Apply the CHG Soap to your body ONLY FROM THE NECK DOWN.   Do not use on face/ open                           Wound or open sores. Avoid contact with eyes, ears mouth and genitals (private parts).                       Wash face,  Genitals (private parts) with your normal soap.             6.  Wash thoroughly, paying special attention to the area where your surgery  will be performed.  7.  Thoroughly rinse your body with warm water from the neck down.  8.  DO NOT shower/wash with your normal soap after using and rinsing off  the CHG Soap.                9.  Pat yourself dry with a clean towel.            10.  Wear clean pajamas.            11.  Place clean sheets on your bed the night of your first shower and do not  sleep with pets. Day of Surgery : Do not apply any lotions/deodorants the morning of surgery.  Please wear clean clothes to the hospital/surgery center.  FAILURE TO FOLLOW THESE INSTRUCTIONS MAY RESULT IN THE CANCELLATION OF YOUR SURGERY PATIENT SIGNATURE_________________________________  NURSE SIGNATURE__________________________________  ________________________________________________________________________

## 2019-03-12 NOTE — Progress Notes (Signed)
US AORTA 11-13-16 EPIC

## 2019-03-13 ENCOUNTER — Other Ambulatory Visit: Payer: Self-pay

## 2019-03-13 ENCOUNTER — Encounter (HOSPITAL_COMMUNITY)
Admission: RE | Admit: 2019-03-13 | Discharge: 2019-03-13 | Disposition: A | Discharge status: Home / Self Care | Payer: PPO | Source: Ambulatory Visit | Attending: Orthopedic Surgery | Admitting: Orthopedic Surgery

## 2019-03-13 ENCOUNTER — Encounter (HOSPITAL_COMMUNITY): Payer: Self-pay

## 2019-03-13 DIAGNOSIS — Z794 Long term (current) use of insulin: Secondary | ICD-10-CM | POA: Insufficient documentation

## 2019-03-13 DIAGNOSIS — Z01818 Encounter for other preprocedural examination: Secondary | ICD-10-CM | POA: Insufficient documentation

## 2019-03-13 DIAGNOSIS — Z79899 Other long term (current) drug therapy: Secondary | ICD-10-CM | POA: Diagnosis not present

## 2019-03-13 DIAGNOSIS — M1612 Unilateral primary osteoarthritis, left hip: Secondary | ICD-10-CM | POA: Insufficient documentation

## 2019-03-13 DIAGNOSIS — E119 Type 2 diabetes mellitus without complications: Secondary | ICD-10-CM | POA: Diagnosis not present

## 2019-03-13 HISTORY — DX: Pneumonia, unspecified organism: J18.9

## 2019-03-13 HISTORY — DX: Unspecified temporomandibular joint disorder, unspecified side: M26.609

## 2019-03-13 HISTORY — DX: Unspecified osteoarthritis, unspecified site: M19.90

## 2019-03-13 LAB — CBC
HCT: 42.1 % (ref 39.0–52.0)
Hemoglobin: 13.4 g/dL (ref 13.0–17.0)
MCH: 29.5 pg (ref 26.0–34.0)
MCHC: 31.8 g/dL (ref 30.0–36.0)
MCV: 92.5 fL (ref 80.0–100.0)
Platelets: 197 10*3/uL (ref 150–400)
RBC: 4.55 MIL/uL (ref 4.22–5.81)
RDW: 14 % (ref 11.5–15.5)
WBC: 7.7 10*3/uL (ref 4.0–10.5)
nRBC: 0 % (ref 0.0–0.2)

## 2019-03-13 LAB — BASIC METABOLIC PANEL
Anion gap: 14 (ref 5–15)
BUN: 29 mg/dL — ABNORMAL HIGH (ref 8–23)
CO2: 26 mmol/L (ref 22–32)
Calcium: 8.9 mg/dL (ref 8.9–10.3)
Chloride: 99 mmol/L (ref 98–111)
Creatinine, Ser: 1.37 mg/dL — ABNORMAL HIGH (ref 0.61–1.24)
GFR calc Af Amer: 57 mL/min — ABNORMAL LOW (ref >60)
GFR calc non Af Amer: 49 mL/min — ABNORMAL LOW (ref >60)
Glucose, Bld: 95 mg/dL (ref 70–99)
Potassium: 4.8 mmol/L (ref 3.5–5.1)
Sodium: 139 mmol/L (ref 135–145)

## 2019-03-13 LAB — HEMOGLOBIN A1C
Hgb A1c MFr Bld: 6.8 % — ABNORMAL HIGH (ref 4.8–5.6)
Mean Plasma Glucose: 148.46 mg/dL

## 2019-03-13 LAB — SURGICAL PCR SCREEN
MRSA, PCR: NEGATIVE
Staphylococcus aureus: NEGATIVE

## 2019-03-13 LAB — GLUCOSE, CAPILLARY: Glucose-Capillary: 96 mg/dL (ref 70–99)

## 2019-03-20 ENCOUNTER — Other Ambulatory Visit (HOSPITAL_COMMUNITY)
Admission: RE | Admit: 2019-03-20 | Discharge: 2019-03-20 | Disposition: A | Discharge status: Home / Self Care | Payer: PPO | Source: Ambulatory Visit | Attending: Orthopedic Surgery | Admitting: Orthopedic Surgery

## 2019-03-20 DIAGNOSIS — Z1159 Encounter for screening for other viral diseases: Secondary | ICD-10-CM | POA: Insufficient documentation

## 2019-03-21 LAB — SARS CORONAVIRUS 2 (TAT 6-24 HRS): SARS Coronavirus 2: NEGATIVE

## 2019-03-24 ENCOUNTER — Inpatient Hospital Stay (HOSPITAL_COMMUNITY): Payer: PPO | Admitting: Anesthesiology

## 2019-03-24 ENCOUNTER — Inpatient Hospital Stay (HOSPITAL_COMMUNITY)
Admission: RE | Admit: 2019-03-24 | Discharge: 2019-03-25 | DRG: 470 | Disposition: A | Discharge status: Home / Self Care | Payer: PPO | Attending: Orthopedic Surgery | Admitting: Orthopedic Surgery

## 2019-03-24 ENCOUNTER — Inpatient Hospital Stay (HOSPITAL_COMMUNITY): Payer: PPO

## 2019-03-24 ENCOUNTER — Inpatient Hospital Stay (HOSPITAL_COMMUNITY): Payer: PPO | Admitting: Physician Assistant

## 2019-03-24 ENCOUNTER — Other Ambulatory Visit: Payer: Self-pay

## 2019-03-24 ENCOUNTER — Encounter (HOSPITAL_COMMUNITY): Payer: Self-pay

## 2019-03-24 ENCOUNTER — Encounter (HOSPITAL_COMMUNITY)
Admission: RE | Disposition: A | Discharge status: Home / Self Care | Payer: Self-pay | Source: Home / Self Care | Attending: Orthopedic Surgery

## 2019-03-24 DIAGNOSIS — Z7982 Long term (current) use of aspirin: Secondary | ICD-10-CM

## 2019-03-24 DIAGNOSIS — Z96649 Presence of unspecified artificial hip joint: Secondary | ICD-10-CM

## 2019-03-24 DIAGNOSIS — Z79899 Other long term (current) drug therapy: Secondary | ICD-10-CM | POA: Diagnosis not present

## 2019-03-24 DIAGNOSIS — Z6829 Body mass index (BMI) 29.0-29.9, adult: Secondary | ICD-10-CM | POA: Diagnosis not present

## 2019-03-24 DIAGNOSIS — Z87891 Personal history of nicotine dependence: Secondary | ICD-10-CM | POA: Diagnosis not present

## 2019-03-24 DIAGNOSIS — I10 Essential (primary) hypertension: Secondary | ICD-10-CM | POA: Diagnosis present

## 2019-03-24 DIAGNOSIS — E119 Type 2 diabetes mellitus without complications: Secondary | ICD-10-CM | POA: Diagnosis present

## 2019-03-24 DIAGNOSIS — M1612 Unilateral primary osteoarthritis, left hip: Principal | ICD-10-CM | POA: Diagnosis present

## 2019-03-24 DIAGNOSIS — Z833 Family history of diabetes mellitus: Secondary | ICD-10-CM

## 2019-03-24 DIAGNOSIS — I4510 Unspecified right bundle-branch block: Secondary | ICD-10-CM | POA: Diagnosis not present

## 2019-03-24 DIAGNOSIS — Z7984 Long term (current) use of oral hypoglycemic drugs: Secondary | ICD-10-CM | POA: Diagnosis not present

## 2019-03-24 DIAGNOSIS — Z794 Long term (current) use of insulin: Secondary | ICD-10-CM | POA: Diagnosis not present

## 2019-03-24 DIAGNOSIS — K219 Gastro-esophageal reflux disease without esophagitis: Secondary | ICD-10-CM | POA: Diagnosis present

## 2019-03-24 DIAGNOSIS — Z471 Aftercare following joint replacement surgery: Secondary | ICD-10-CM | POA: Diagnosis not present

## 2019-03-24 DIAGNOSIS — E669 Obesity, unspecified: Secondary | ICD-10-CM | POA: Diagnosis not present

## 2019-03-24 DIAGNOSIS — Z96642 Presence of left artificial hip joint: Secondary | ICD-10-CM | POA: Diagnosis not present

## 2019-03-24 HISTORY — PX: TOTAL HIP ARTHROPLASTY: SHX124

## 2019-03-24 HISTORY — DX: Unilateral primary osteoarthritis, left hip: M16.12

## 2019-03-24 LAB — GLUCOSE, CAPILLARY
Glucose-Capillary: 152 mg/dL — ABNORMAL HIGH (ref 70–99)
Glucose-Capillary: 93 mg/dL (ref 70–99)
Glucose-Capillary: 136 mg/dL — ABNORMAL HIGH (ref 70–99)

## 2019-03-24 SURGERY — ARTHROPLASTY, HIP, TOTAL,POSTERIOR APPROACH
Anesthesia: Spinal | Site: Hip | Laterality: Left

## 2019-03-24 MED ORDER — ONDANSETRON HCL 4 MG PO TABS: 4.0000 mg | ORAL_TABLET | Freq: Four times a day (QID) | ORAL | Status: DC | PRN

## 2019-03-24 MED ORDER — MORPHINE SULFATE (PF) 2 MG/ML IV SOLN: 0.5000 mg | INTRAVENOUS | Status: DC | PRN

## 2019-03-24 MED ORDER — ZOLPIDEM TARTRATE 5 MG PO TABS: 5.0000 mg | ORAL_TABLET | Freq: Every evening | ORAL | Status: DC | PRN

## 2019-03-24 MED ORDER — KETOROLAC TROMETHAMINE 30 MG/ML IJ SOLN
INTRAMUSCULAR | Status: DC | PRN
  Administered 2019-03-24: 30 mg

## 2019-03-24 MED ORDER — DEXAMETHASONE SODIUM PHOSPHATE 10 MG/ML IJ SOLN
10.0000 mg | Freq: Once | INTRAMUSCULAR | Status: AC
  Administered 2019-03-25: 10 mg via INTRAVENOUS
  Filled 2019-03-24: qty 1

## 2019-03-24 MED ORDER — POVIDONE-IODINE 10 % EX SWAB: 2.0000 "application " | Freq: Once | CUTANEOUS | Status: DC

## 2019-03-24 MED ORDER — BUPIVACAINE HCL (PF) 0.25 % IJ SOLN
INTRAMUSCULAR | Status: DC | PRN
  Administered 2019-03-24: 30 mL

## 2019-03-24 MED ORDER — SENNA-DOCUSATE SODIUM 8.6-50 MG PO TABS: 2.0000 | ORAL_TABLET | Freq: Every day | ORAL | 1 refills | Status: DC

## 2019-03-24 MED ORDER — ACETAMINOPHEN 500 MG PO TABS
500.0000 mg | ORAL_TABLET | Freq: Four times a day (QID) | ORAL | Status: AC
  Administered 2019-03-24 – 2019-03-25 (×4): 500 mg via ORAL
  Filled 2019-03-24 (×4): qty 1

## 2019-03-24 MED ORDER — SODIUM CHLORIDE 0.9 % IV SOLN
INTRAVENOUS | Status: DC | PRN
  Administered 2019-03-24: 25 ug/min via INTRAVENOUS

## 2019-03-24 MED ORDER — TRAZODONE HCL 100 MG PO TABS
100.0000 mg | ORAL_TABLET | Freq: Every day | ORAL | Status: DC
  Administered 2019-03-24: 100 mg via ORAL
  Filled 2019-03-24: qty 1

## 2019-03-24 MED ORDER — HYDROCODONE-ACETAMINOPHEN 5-325 MG PO TABS: 1.0000 | ORAL_TABLET | ORAL | Status: DC | PRN

## 2019-03-24 MED ORDER — HYDROCODONE-ACETAMINOPHEN 10-325 MG PO TABS: 1.0000 | ORAL_TABLET | Freq: Four times a day (QID) | ORAL | 0 refills | Status: DC | PRN

## 2019-03-24 MED ORDER — ONDANSETRON HCL 4 MG/2ML IJ SOLN: 4.0000 mg | Freq: Four times a day (QID) | INTRAMUSCULAR | Status: DC | PRN

## 2019-03-24 MED ORDER — BISACODYL 10 MG RE SUPP: 10.0000 mg | Freq: Every day | RECTAL | Status: DC | PRN

## 2019-03-24 MED ORDER — MENTHOL 3 MG MT LOZG: 1.0000 | LOZENGE | OROMUCOSAL | Status: DC | PRN

## 2019-03-24 MED ORDER — KETOROLAC TROMETHAMINE 30 MG/ML IJ SOLN
INTRAMUSCULAR | Status: AC
  Filled 2019-03-24: qty 1

## 2019-03-24 MED ORDER — PANTOPRAZOLE SODIUM 40 MG PO TBEC
40.0000 mg | DELAYED_RELEASE_TABLET | Freq: Every day | ORAL | Status: DC
  Administered 2019-03-25: 09:00:00 40 mg via ORAL
  Filled 2019-03-24: qty 1

## 2019-03-24 MED ORDER — ACETAMINOPHEN 500 MG PO TABS
ORAL_TABLET | ORAL | Status: AC
  Administered 2019-03-24: 12:00:00 1000 mg via ORAL
  Filled 2019-03-24: qty 2

## 2019-03-24 MED ORDER — INSULIN ASPART 100 UNIT/ML ~~LOC~~ SOLN
0.0000 [IU] | Freq: Three times a day (TID) | SUBCUTANEOUS | Status: DC
  Administered 2019-03-25: 5 [IU] via SUBCUTANEOUS
  Administered 2019-03-25: 3 [IU] via SUBCUTANEOUS

## 2019-03-24 MED ORDER — VITAMIN B-12 1000 MCG PO TABS
1000.0000 ug | ORAL_TABLET | Freq: Every day | ORAL | Status: DC
  Administered 2019-03-25: 09:00:00 1000 ug via ORAL
  Filled 2019-03-24: qty 1

## 2019-03-24 MED ORDER — PROPOFOL 500 MG/50ML IV EMUL
INTRAVENOUS | Status: DC | PRN
  Administered 2019-03-24: 10 mg via INTRAVENOUS
  Administered 2019-03-24 (×2): 20 mg via INTRAVENOUS

## 2019-03-24 MED ORDER — METHOCARBAMOL 500 MG PO TABS
500.0000 mg | ORAL_TABLET | Freq: Four times a day (QID) | ORAL | Status: DC | PRN
  Administered 2019-03-25: 500 mg via ORAL
  Filled 2019-03-24: qty 1

## 2019-03-24 MED ORDER — METOCLOPRAMIDE HCL 5 MG/ML IJ SOLN: 5.0000 mg | Freq: Three times a day (TID) | INTRAMUSCULAR | Status: DC | PRN

## 2019-03-24 MED ORDER — PROPOFOL 10 MG/ML IV BOLUS
INTRAVENOUS | Status: AC
  Filled 2019-03-24: qty 20

## 2019-03-24 MED ORDER — TRANEXAMIC ACID-NACL 1000-0.7 MG/100ML-% IV SOLN
INTRAVENOUS | Status: AC
  Filled 2019-03-24: qty 100

## 2019-03-24 MED ORDER — BACLOFEN 10 MG PO TABS: 10.0000 mg | ORAL_TABLET | Freq: Three times a day (TID) | ORAL | 0 refills | Status: DC

## 2019-03-24 MED ORDER — TRANEXAMIC ACID-NACL 1000-0.7 MG/100ML-% IV SOLN
1000.0000 mg | Freq: Once | INTRAVENOUS | Status: AC
  Administered 2019-03-24: 1000 mg via INTRAVENOUS
  Filled 2019-03-24: qty 100

## 2019-03-24 MED ORDER — DOCUSATE SODIUM 100 MG PO CAPS
100.0000 mg | ORAL_CAPSULE | Freq: Two times a day (BID) | ORAL | Status: DC
  Administered 2019-03-24 – 2019-03-25 (×2): 100 mg via ORAL
  Filled 2019-03-24 (×2): qty 1

## 2019-03-24 MED ORDER — FENTANYL CITRATE (PF) 100 MCG/2ML IJ SOLN: 25.0000 ug | INTRAMUSCULAR | Status: DC | PRN

## 2019-03-24 MED ORDER — STERILE WATER FOR IRRIGATION IR SOLN
Status: DC | PRN
  Administered 2019-03-24: 2000 mL

## 2019-03-24 MED ORDER — ONDANSETRON HCL 4 MG PO TABS: 4.0000 mg | ORAL_TABLET | Freq: Three times a day (TID) | ORAL | 0 refills | Status: DC | PRN

## 2019-03-24 MED ORDER — HYDROCHLOROTHIAZIDE 25 MG PO TABS
25.0000 mg | ORAL_TABLET | Freq: Every day | ORAL | Status: DC
  Filled 2019-03-24: qty 1

## 2019-03-24 MED ORDER — KETOROLAC TROMETHAMINE 15 MG/ML IJ SOLN
7.5000 mg | Freq: Four times a day (QID) | INTRAMUSCULAR | Status: AC
  Administered 2019-03-24 – 2019-03-25 (×4): 7.5 mg via INTRAVENOUS
  Filled 2019-03-24 (×4): qty 1

## 2019-03-24 MED ORDER — CEFAZOLIN SODIUM-DEXTROSE 2-4 GM/100ML-% IV SOLN
2.0000 g | Freq: Four times a day (QID) | INTRAVENOUS | Status: AC
  Administered 2019-03-24 – 2019-03-25 (×2): 2 g via INTRAVENOUS
  Filled 2019-03-24 (×3): qty 100

## 2019-03-24 MED ORDER — INSULIN GLARGINE 100 UNIT/ML ~~LOC~~ SOLN
20.0000 [IU] | Freq: Every day | SUBCUTANEOUS | Status: DC
  Administered 2019-03-25: 20 [IU] via SUBCUTANEOUS
  Filled 2019-03-24: qty 0.2

## 2019-03-24 MED ORDER — ASPIRIN EC 325 MG PO TBEC: 325.0000 mg | DELAYED_RELEASE_TABLET | Freq: Two times a day (BID) | ORAL | 0 refills | Status: DC

## 2019-03-24 MED ORDER — TRANEXAMIC ACID-NACL 1000-0.7 MG/100ML-% IV SOLN
1000.0000 mg | INTRAVENOUS | Status: AC
  Administered 2019-03-24: 14:00:00 1000 mg via INTRAVENOUS

## 2019-03-24 MED ORDER — BUPIVACAINE HCL (PF) 0.25 % IJ SOLN
INTRAMUSCULAR | Status: AC
  Filled 2019-03-24: qty 30

## 2019-03-24 MED ORDER — POTASSIUM CHLORIDE IN NACL 20-0.45 MEQ/L-% IV SOLN
INTRAVENOUS | Status: DC
  Administered 2019-03-24: 22:00:00 via INTRAVENOUS
  Filled 2019-03-24 (×2): qty 1000

## 2019-03-24 MED ORDER — PROPOFOL 500 MG/50ML IV EMUL
INTRAVENOUS | Status: DC | PRN
  Administered 2019-03-24: 50 ug/kg/min via INTRAVENOUS

## 2019-03-24 MED ORDER — ONDANSETRON HCL 4 MG/2ML IJ SOLN
INTRAMUSCULAR | Status: AC
  Filled 2019-03-24: qty 2

## 2019-03-24 MED ORDER — ASPIRIN EC 325 MG PO TBEC
325.0000 mg | DELAYED_RELEASE_TABLET | Freq: Two times a day (BID) | ORAL | Status: DC
  Administered 2019-03-24 – 2019-03-25 (×2): 325 mg via ORAL
  Filled 2019-03-24 (×2): qty 1

## 2019-03-24 MED ORDER — ALUM & MAG HYDROXIDE-SIMETH 200-200-20 MG/5ML PO SUSP: 30.0000 mL | ORAL | Status: DC | PRN

## 2019-03-24 MED ORDER — DULAGLUTIDE 1.5 MG/0.5ML ~~LOC~~ SOAJ: 1.5000 mg | SUBCUTANEOUS | Status: DC

## 2019-03-24 MED ORDER — HYDROCODONE-ACETAMINOPHEN 7.5-325 MG PO TABS
1.0000 | ORAL_TABLET | ORAL | Status: DC | PRN
  Administered 2019-03-24: 1 via ORAL
  Administered 2019-03-25: 2 via ORAL
  Administered 2019-03-25 (×2): 1 via ORAL
  Filled 2019-03-24 (×4): qty 1

## 2019-03-24 MED ORDER — PROPOFOL 10 MG/ML IV BOLUS
INTRAVENOUS | Status: AC
  Filled 2019-03-24: qty 40

## 2019-03-24 MED ORDER — LISINOPRIL 20 MG PO TABS
40.0000 mg | ORAL_TABLET | Freq: Every day | ORAL | Status: DC
  Filled 2019-03-24: qty 2

## 2019-03-24 MED ORDER — LACTATED RINGERS IV SOLN
INTRAVENOUS | Status: DC
  Administered 2019-03-24 (×2): via INTRAVENOUS

## 2019-03-24 MED ORDER — DIPHENHYDRAMINE HCL 12.5 MG/5ML PO ELIX: 12.5000 mg | ORAL_SOLUTION | ORAL | Status: DC | PRN

## 2019-03-24 MED ORDER — METOCLOPRAMIDE HCL 5 MG PO TABS: 5.0000 mg | ORAL_TABLET | Freq: Three times a day (TID) | ORAL | Status: DC | PRN

## 2019-03-24 MED ORDER — METFORMIN HCL 500 MG PO TABS
1000.0000 mg | ORAL_TABLET | Freq: Two times a day (BID) | ORAL | Status: DC
  Administered 2019-03-25: 1000 mg via ORAL
  Filled 2019-03-24: qty 2

## 2019-03-24 MED ORDER — ONDANSETRON HCL 4 MG/2ML IJ SOLN
INTRAMUSCULAR | Status: DC | PRN
  Administered 2019-03-24: 4 mg via INTRAVENOUS

## 2019-03-24 MED ORDER — CEFAZOLIN SODIUM-DEXTROSE 2-4 GM/100ML-% IV SOLN
2.0000 g | INTRAVENOUS | Status: AC
  Administered 2019-03-24: 14:00:00 2 g via INTRAVENOUS

## 2019-03-24 MED ORDER — GABAPENTIN 300 MG PO CAPS
600.0000 mg | ORAL_CAPSULE | Freq: Every day | ORAL | Status: DC
  Administered 2019-03-24: 600 mg via ORAL
  Filled 2019-03-24: qty 2

## 2019-03-24 MED ORDER — METHOCARBAMOL 500 MG IVPB - SIMPLE MED
500.0000 mg | Freq: Four times a day (QID) | INTRAVENOUS | Status: DC | PRN
  Filled 2019-03-24: qty 50

## 2019-03-24 MED ORDER — BUPIVACAINE IN DEXTROSE 0.75-8.25 % IT SOLN
INTRATHECAL | Status: DC | PRN
  Administered 2019-03-24: 1.8 mL via INTRATHECAL

## 2019-03-24 MED ORDER — ACETAMINOPHEN 500 MG PO TABS
1000.0000 mg | ORAL_TABLET | Freq: Once | ORAL | Status: AC
  Administered 2019-03-24: 12:00:00 1000 mg via ORAL

## 2019-03-24 MED ORDER — 0.9 % SODIUM CHLORIDE (POUR BTL) OPTIME
TOPICAL | Status: DC | PRN
  Administered 2019-03-24: 1000 mL

## 2019-03-24 MED ORDER — ACETAMINOPHEN 325 MG PO TABS: 325.0000 mg | ORAL_TABLET | Freq: Four times a day (QID) | ORAL | Status: DC | PRN

## 2019-03-24 MED ORDER — CEFAZOLIN SODIUM-DEXTROSE 2-4 GM/100ML-% IV SOLN
INTRAVENOUS | Status: AC
  Filled 2019-03-24: qty 100

## 2019-03-24 MED ORDER — POLYETHYLENE GLYCOL 3350 17 G PO PACK: 17.0000 g | PACK | Freq: Every day | ORAL | Status: DC | PRN

## 2019-03-24 MED ORDER — MAGNESIUM CITRATE PO SOLN: 1.0000 | Freq: Once | ORAL | Status: DC | PRN

## 2019-03-24 MED ORDER — PHENOL 1.4 % MT LIQD: 1.0000 | OROMUCOSAL | Status: DC | PRN

## 2019-03-24 SURGICAL SUPPLY — 62 items
ACETAB CUP W GRIPTION 54MM (Plate) ×1 IMPLANT
ACETAB CUP W/GRIPTION 54 (Plate) ×2 IMPLANT
ARTICULEZE HEAD (Hips) ×3 IMPLANT
BIT DRILL 2.0X128 (BIT) ×2 IMPLANT
BIT DRILL 2.0X128MM (BIT) ×1
BLADE SAW SAG 73X25 THK (BLADE) ×1
BLADE SAW SGTL 73X25 THK (BLADE) ×2 IMPLANT
BLADE SURG SZ10 CARB STEEL (BLADE) ×6 IMPLANT
CLOSURE STERI-STRIP 1/2X4 (GAUZE/BANDAGES/DRESSINGS) ×1
CLSR STERI-STRIP ANTIMIC 1/2X4 (GAUZE/BANDAGES/DRESSINGS) ×3 IMPLANT
COVER SURGICAL LIGHT HANDLE (MISCELLANEOUS) ×3 IMPLANT
COVER WAND RF STERILE (DRAPES) IMPLANT
CUP ACETAB W/GRIPTION 54 (Plate) IMPLANT
DRAPE INCISE IOBAN 66X45 STRL (DRAPES) ×6 IMPLANT
DRAPE ORTHO SPLIT 77X108 STRL (DRAPES) ×4
DRAPE POUCH INSTRU U-SHP 10X18 (DRAPES) ×3 IMPLANT
DRAPE SHEET LG 3/4 BI-LAMINATE (DRAPES) ×3 IMPLANT
DRAPE SURG 17X11 SM STRL (DRAPES) ×3 IMPLANT
DRAPE SURG ORHT 6 SPLT 77X108 (DRAPES) ×2 IMPLANT
DRAPE U-SHAPE 47X51 STRL (DRAPES) ×3 IMPLANT
DRESSING ALLEVYN LIFE SACRUM (GAUZE/BANDAGES/DRESSINGS) ×2 IMPLANT
DRSG MEPILEX BORDER 4X8 (GAUZE/BANDAGES/DRESSINGS) ×3 IMPLANT
DURAPREP 26ML APPLICATOR (WOUND CARE) ×6 IMPLANT
ELECT BLADE TIP CTD 4 INCH (ELECTRODE) ×3 IMPLANT
ELECT REM PT RETURN 15FT ADLT (MISCELLANEOUS) ×3 IMPLANT
ELIMINATOR HOLE APEX DEPUY (Hips) ×2 IMPLANT
GLOVE BIO SURGEON STRL SZ7.5 (GLOVE) ×3 IMPLANT
GLOVE BIO SURGEON STRL SZ8 (GLOVE) ×3 IMPLANT
GLOVE BIOGEL PI IND STRL 8 (GLOVE) ×2 IMPLANT
GLOVE BIOGEL PI INDICATOR 8 (GLOVE) ×4
GOWN STRL REUS W/TWL 2XL LVL3 (GOWN DISPOSABLE) ×3 IMPLANT
GOWN STRL REUS W/TWL LRG LVL3 (GOWN DISPOSABLE) ×3 IMPLANT
HEAD ARTICULEZE (Hips) IMPLANT
HOOD PEEL AWAY FLYTE STAYCOOL (MISCELLANEOUS) ×9 IMPLANT
KIT BASIN OR (CUSTOM PROCEDURE TRAY) ×3 IMPLANT
KIT TURNOVER KIT A (KITS) IMPLANT
LINER NEUTRAL 54X36MM PLUS 4 (Hips) ×2 IMPLANT
MANIFOLD NEPTUNE II (INSTRUMENTS) ×3 IMPLANT
NEEDLE HYPO 22GX1.5 SAFETY (NEEDLE) ×3 IMPLANT
NS IRRIG 1000ML POUR BTL (IV SOLUTION) ×3 IMPLANT
PACK TOTAL JOINT (CUSTOM PROCEDURE TRAY) ×3 IMPLANT
PROTECTOR NERVE ULNAR (MISCELLANEOUS) ×3 IMPLANT
RETRIEVER SUT HEWSON (MISCELLANEOUS) ×3 IMPLANT
SCREW 6.5MMX25MM (Screw) ×2 IMPLANT
STEM HIP DEPUY (Hips) ×2 IMPLANT
SUCTION FRAZIER HANDLE 12FR (TUBING) ×2
SUCTION TUBE FRAZIER 12FR DISP (TUBING) ×1 IMPLANT
SUT FIBERWIRE #2 38 REV NDL BL (SUTURE) ×9
SUT MNCRL AB 4-0 PS2 18 (SUTURE) IMPLANT
SUT VIC AB 0 CT1 36 (SUTURE) ×3 IMPLANT
SUT VIC AB 1 CT1 27 (SUTURE) ×2
SUT VIC AB 1 CT1 27XBRD ANTBC (SUTURE) IMPLANT
SUT VIC AB 2-0 CT1 27 (SUTURE) ×4
SUT VIC AB 2-0 CT1 TAPERPNT 27 (SUTURE) ×2 IMPLANT
SUT VIC AB 3-0 SH 8-18 (SUTURE) ×3 IMPLANT
SUTURE FIBERWR#2 38 REV NDL BL (SUTURE) ×3 IMPLANT
SYR CONTROL 10ML LL (SYRINGE) ×3 IMPLANT
TOWEL OR 17X26 10 PK STRL BLUE (TOWEL DISPOSABLE) ×6 IMPLANT
TOWEL OR NON WOVEN STRL DISP B (DISPOSABLE) ×3 IMPLANT
TRAY FOLEY MTR SLVR 16FR STAT (SET/KITS/TRAYS/PACK) ×3 IMPLANT
WATER STERILE IRR 1000ML POUR (IV SOLUTION) ×6 IMPLANT
YANKAUER SUCT BULB TIP 10FT TU (MISCELLANEOUS) ×3 IMPLANT

## 2019-03-24 NOTE — Anesthesia Procedure Notes (Signed)
Date/Time: 03/24/2019 1:20 PM Performed by: Glory Buff, CRNA Oxygen Delivery Method: Nasal cannula

## 2019-03-24 NOTE — Plan of Care (Signed)
Plan of care for post operative day 0 discussed with patient!

## 2019-03-24 NOTE — Op Note (Signed)
03/24/2019  3:43 PM  PATIENT:  Eduardo Murray   MRN: [id number]  PRE-OPERATIVE DIAGNOSIS: Left hip primary localized osteoarthritis  POST-OPERATIVE DIAGNOSIS:  same  PROCEDURE:  Procedure(s): TOTAL HIP ARTHROPLASTY  PREOPERATIVE INDICATIONS:    Eduardo Murray is an 78 y.o. male who has a diagnosis of left hip primary localized osteoarthritis and elected for surgical management after failing conservative treatment.  The risks benefits and alternatives were discussed with the patient including but not limited to the risks of nonoperative treatment, versus surgical intervention including infection, bleeding, nerve injury, periprosthetic fracture, the need for revision surgery, dislocation, leg length discrepancy, blood clots, cardiopulmonary complications, morbidity, mortality, among others, and they were willing to proceed.     OPERATIVE REPORT     SURGEON:  Marchia Bond, MD    ASSISTANT:  Joya Gaskins, OPA-C  (Present throughout the entire procedure,  necessary for completion of procedure in a timely manner, assisting with retraction, instrumentation, and closure)     ANESTHESIA: Spinal  ESTIMATED BLOOD LOSS: 400 mL    COMPLICATIONS:  None.     UNIQUE ASPECTS OF THE CASE: His proximal geometry was somewhat abnormal, he almost had a neutral version on his femoral neck, I did place him in about 15 to 20 degrees of anteversion, but this did not match his anatomy.  He had a significant varus alignment of his femoral neck, I did use a high offset, and he had excellent stability at the completion of the case, the soft tissues were just barely reaching the trochanter on the bone repair.  COMPONENTS:  [employer] fit femur size 5 high offset with a 36 mm + 5 metallic head ball and a Gription Acetabular shell size 54, with a single cancellous screw for backup fixation, with an apex hole eliminator and a +4 neutral polyethylene liner.    PROCEDURE IN DETAIL:   The patient  was met in the holding area and  identified.  The appropriate hip was identified and marked at the operative site.  The patient was then transported to the OR  and  placed under anesthesia.  At that point, the patient was  placed in the lateral decubitus position with the operative side up and  secured to the operating room table and all bony prominences padded.     The operative lower extremity was prepped from the iliac crest to the distal leg.  Sterile draping was performed.  Time out was performed prior to incision.      A routine posterolateral approach was utilized via sharp dissection  carried down to the subcutaneous tissue.  Gross bleeders were Bovie coagulated.  The iliotibial band was identified and incised along the length of the skin incision.  Self-retaining retractors were  inserted.  With the hip internally rotated, the short external rotators  were identified. The piriformis and capsule was tagged with FiberWire, and the hip capsule released in a T-type fashion.  The femoral neck was exposed, and I resected the femoral neck using the appropriate jig. This was performed at approximately a thumb's breadth above the lesser trochanter.    I then exposed the deep acetabulum, cleared out any tissue including the ligamentum teres.  A wing retractor was placed.  After adequate visualization, I excised the labrum, and then sequentially reamed.  I placed the trial acetabulum, which seated nicely, and then impacted the real cup into place.  Appropriate version and inclination was confirmed clinically matching their bony anatomy, and also with  the use of the jig.  I placed a cancellous screw to augment fixation.  I was matching both front and back walls.  A trial polyethylene liner was placed and the wing retractor removed.    I then prepared the proximal femur using the cookie-cutter, the lateralizing reamer, and then sequentially reamed and broached.  I was initially countersunk, and used the  calcar planar, he was even larger than the large planar, so I used a rongeur to smooth the medial lip.    A trial broach, neck, and head was utilized, and I reduced the hip with a standard 5 femur and a +1.5 head, but it it was slightly short and medium stability.  The soft tissue looked redundant so I decided to use a high offset +5 neck.  The real polyethylene liner was placed.  I then impacted the real femoral prosthesis into place into the appropriate version, anteverted to the normal anatomy because his anatomy had almost no anteversion, and I impacted the real head ball into place. The hip was then reduced and taken through functional range of motion and found to have excellent stability. Leg lengths were restored.  Reduction was a little challenging.  I then used a 2 mm drill bits to pass the FiberWire suture from the capsule and piriformis through the greater trochanter, and secured this. Satisfactory posterior capsular repair was achieved. I also closed the T in the capsule.  I then irrigated the hip copiously again with pulse lavage, and repaired the fascia with Vicryl, followed by Vicryl for the subcutaneous tissue, Monocryl for the skin, Steri-Strips and sterile gauze. The wounds were injected. The patient was then awakened and returned to PACU in stable and satisfactory condition. There were no complications.  Marchia Bond, MD Orthopedic Surgeon [phone]   03/24/2019 3:43 PM

## 2019-03-24 NOTE — Transfer of Care (Signed)
Immediate Anesthesia Transfer of Care Note  Patient: Eduardo Murray  Procedure(s) Performed: TOTAL HIP ARTHROPLASTY (Left Hip)  Patient Location: PACU  Anesthesia Type:MAC and Spinal  Level of Consciousness: awake, alert  and oriented  Airway & Oxygen Therapy: Patient Spontanous Breathing and Patient connected to face mask oxygen  Post-op Assessment: Report given to RN and Post -op Vital signs reviewed and stable  Post vital signs: Reviewed and stable  Last Vitals:  Vitals Value Taken Time  BP 152/83 03/24/19 1618  Temp    Pulse 49 03/24/19 1619  Resp 16 03/24/19 1619  SpO2 100 % 03/24/19 1619  Vitals shown include unvalidated device data.  Last Pain:  Vitals:   03/24/19 1152  TempSrc: Oral  PainSc:          Complications: No apparent anesthesia complications

## 2019-03-24 NOTE — Anesthesia Procedure Notes (Signed)
Spinal  Patient location during procedure: OR Start time: 03/24/2019 1:44 PM End time: 03/24/2019 2:47 PM Staffing Anesthesiologist: Freddrick March, MD Resident/CRNA: Glory Buff, CRNA Performed: resident/CRNA  Preanesthetic Checklist Completed: patient identified, site marked, surgical consent, pre-op evaluation, timeout performed, IV checked, risks and benefits discussed and monitors and equipment checked Spinal Block Patient position: sitting Prep: DuraPrep Patient monitoring: heart rate, cardiac monitor, continuous pulse ox and blood pressure Approach: midline Location: L3-4 Injection technique: single-shot Needle Needle type: Pencan  Needle gauge: 24 G Needle length: 9 cm Assessment Sensory level: T4 Additional Notes Kit expiration date checked and verified.  Sterile prep and drape. Skin local with 1% lidocaine, stick x 1, - heme, - paraesthesia, + CSF pre and post injection, patient tolerated procedure well.

## 2019-03-24 NOTE — H&P (Signed)
PREOPERATIVE H&P  Chief Complaint: left hip pain  HPI: Eduardo Murray is a 78 y.o. male who presents for preoperative history and physical with a diagnosis of left hip primary localized osteoarthritis. Symptoms are rated as moderate to severe, and have been worsening.  This is significantly impairing activities of daily living.  He has elected for surgical management.   He has failed injections, activity modification, anti-inflammatories, and assistive devices.  Preoperative X-rays demonstrate end stage degenerative changes with osteophyte formation, loss of joint space, subchondral sclerosis.   Past Medical History:  Diagnosis Date  . Arthritis   . Diabetes mellitus without complication ([place])    type 2  . Hypertension   . Pneumonia   . TMJ (temporomandibular joint disorder)    Past Surgical History:  Procedure Laterality Date  . right elbow surgery    . right hand surgery    . ROTATOR CUFF REPAIR Left    Social History   Socioeconomic History  . Marital status: Married    Spouse name: Not on file  . Number of children: Not on file  . Years of education: Not on file  . Highest education level: Not on file  Occupational History  . Not on file  Social Needs  . Financial resource strain: Not on file  . Food insecurity    Worry: Not on file    Inability: Not on file  . Transportation needs    Medical: Not on file    Non-medical: Not on file  Tobacco Use  . Smoking status: Former Smoker    Packs/day: 1.00    Years: 30.00    Pack years: 30.00    Types: Cigarettes    Quit date: 12/02/1992    Years since quitting: 26.3  . Smokeless tobacco: Former Systems developer    Types: Snuff    Quit date: 12/03/2007  Substance and Sexual Activity  . Alcohol use: No  . Drug use: No  . Sexual activity: Not Currently  Lifestyle  . Physical activity    Days per week: Not on file    Minutes per session: Not on file  . Stress: Not on file  Relationships  . Social Herbalist on  phone: Not on file    Gets together: Not on file    Attends religious service: Not on file    Active member of club or organization: Not on file    Attends meetings of clubs or organizations: Not on file    Relationship status: Not on file  Other Topics Concern  . Not on file  Social History Narrative  . Not on file   Family History  Problem Relation Age of Onset  . Cancer Father   . Diabetes Mother   . Cancer Sister   . Diabetes Sister   . Diabetes Brother    No Known Allergies Prior to Admission medications   Medication Sig Start Date End Date Taking? Authorizing Provider  aspirin EC 81 MG tablet Take 81 mg by mouth daily.   Yes [provider]  Dulaglutide (TRULICITY) 1.5 HU/7.6LY SOPN Inject 1.5 mg into the skin every Sunday.   Yes [provider]  hydrochlorothiazide (HYDRODIURIL) 25 MG tablet Take 25 mg by mouth daily.   Yes [provider]  Insulin Glargine (TOUJEO MAX SOLOSTAR [place]) Inject 20 Units into the skin daily.   Yes [provider]  lisinopril (ZESTRIL) 40 MG tablet Take 40 mg by mouth daily.   Yes [provider]  metFORMIN (GLUCOPHAGE) 1000 MG tablet Take 1,000 mg by mouth 2 (two) times daily with a meal.   Yes [provider]  pantoprazole (PROTONIX) 40 MG tablet Take 40 mg by mouth daily.  02/17/14  Yes [provider]  traZODone (DESYREL) 50 MG tablet Take 100 mg by mouth at bedtime.   Yes [provider]  gabapentin (NEURONTIN) 300 MG capsule Take 600 mg by mouth at bedtime.    [provider]  vitamin B-12 (CYANOCOBALAMIN) 1000 MCG tablet Take 1,000 mcg by mouth daily.    [provider]     Positive ROS: All other systems have been reviewed and were otherwise negative with the exception of those mentioned in the HPI and as above.  Physical Exam: General: Alert, no acute distress Cardiovascular: No pedal edema Respiratory: No cyanosis, no use of accessory  musculature GI: No organomegaly, abdomen is soft and non-tender Skin: No lesions in the area of chief complaint Neurologic: Sensation intact distally Psychiatric: Patient is competent for consent with normal mood and affect Lymphatic: No axillary or cervical lymphadenopathy  MUSCULOSKELETAL: left hip arom 0-85 with no IR, painful arc of motion  Assessment: Left hip osteoarthritis, primary localized   Plan: Plan for Procedure(s): TOTAL HIP ARTHROPLASTY  The risks benefits and alternatives were discussed with the patient including but not limited to the risks of nonoperative treatment, versus surgical intervention including infection, bleeding, nerve injury, periprosthetic fracture, the need for revision surgery, dislocation, leg length discrepancy, blood clots, cardiopulmonary complications, morbidity, mortality, among others, and they were willing to proceed.     Anticipated LOS equal to or greater than 2 midnights due to - Age 16 and older with one or more of the following:  - Obesity  - Expected need for hospital services (PT, OT, Nursing) required for safe  discharge  - Anticipated need for postoperative skilled nursing care or inpatient rehab  - Active co-morbidities: Diabetes    Johnny Bridge, MD Cell [phone]   03/24/2019 12:05 PM

## 2019-03-24 NOTE — Anesthesia Preprocedure Evaluation (Addendum)
Anesthesia Evaluation  Patient identified by MRN, date of birth, ID band Patient awake    Reviewed: Allergy & Precautions, NPO status , Patient's Chart, lab work & pertinent test results  Airway Mallampati: I  TM Distance: >3 FB Neck ROM: Full    Dental no notable dental hx. (+) Poor Dentition, Chipped, Missing,    Pulmonary former smoker,    Pulmonary exam normal breath sounds clear to auscultation       Cardiovascular hypertension, Normal cardiovascular exam Rhythm:Regular Rate:Normal  EKG 02/2019 Normal sinus rhythm Left axis deviation Right bundle branch block Abnormal ECG   Neuro/Psych    GI/Hepatic GERD  Medicated,  Endo/Other  diabetes, Type 2, Insulin Dependent, Oral Hypoglycemic Agents  Renal/GU      Musculoskeletal  (+) Arthritis , Osteoarthritis,    Abdominal   Peds  Hematology   Anesthesia Other Findings   Reproductive/Obstetrics                           Anesthesia Physical Anesthesia Plan  ASA: III  Anesthesia Plan: Spinal   Post-op Pain Management:    Induction:   PONV Risk Score and Plan: Treatment may vary due to age or medical condition and Propofol infusion  Airway Management Planned: Natural Airway  Additional Equipment:   Intra-op Plan:   Post-operative Plan:   Informed Consent: I have reviewed the patients History and Physical, chart, labs and discussed the procedure including the risks, benefits and alternatives for the proposed anesthesia with the patient or authorized representative who has indicated his/her understanding and acceptance.     Dental advisory given  Plan Discussed with: CRNA  Anesthesia Plan Comments:         Anesthesia Quick Evaluation

## 2019-03-24 NOTE — Discharge Instructions (Signed)

## 2019-03-25 ENCOUNTER — Encounter (HOSPITAL_COMMUNITY): Payer: Self-pay | Admitting: Orthopedic Surgery

## 2019-03-25 LAB — BASIC METABOLIC PANEL
Anion gap: 9 (ref 5–15)
BUN: 23 mg/dL (ref 8–23)
CO2: 27 mmol/L (ref 22–32)
Calcium: 7.8 mg/dL — ABNORMAL LOW (ref 8.9–10.3)
Chloride: 99 mmol/L (ref 98–111)
Creatinine, Ser: 1.16 mg/dL (ref 0.61–1.24)
GFR calc Af Amer: >60 mL/min (ref >60)
GFR calc non Af Amer: >60 mL/min (ref >60)
Glucose, Bld: 165 mg/dL — ABNORMAL HIGH (ref 70–99)
Potassium: 4.3 mmol/L (ref 3.5–5.1)
Sodium: 135 mmol/L (ref 135–145)

## 2019-03-25 LAB — CBC
HCT: 32.3 % — ABNORMAL LOW (ref 39.0–52.0)
Hemoglobin: 10.1 g/dL — ABNORMAL LOW (ref 13.0–17.0)
MCH: 28.6 pg (ref 26.0–34.0)
MCHC: 31.3 g/dL (ref 30.0–36.0)
MCV: 91.5 fL (ref 80.0–100.0)
Platelets: 136 10*3/uL — ABNORMAL LOW (ref 150–400)
RBC: 3.53 MIL/uL — ABNORMAL LOW (ref 4.22–5.81)
RDW: 13.3 % (ref 11.5–15.5)
WBC: 8.1 10*3/uL (ref 4.0–10.5)
nRBC: 0 % (ref 0.0–0.2)

## 2019-03-25 LAB — GLUCOSE, CAPILLARY
Glucose-Capillary: 158 mg/dL — ABNORMAL HIGH (ref 70–99)
Glucose-Capillary: 243 mg/dL — ABNORMAL HIGH (ref 70–99)

## 2019-03-25 NOTE — Evaluation (Signed)
Physical Therapy Evaluation Patient Details Name: Eduardo Murray MRN: [id number] DOB: November 29, 1940 Today's Date: 03/25/2019   History of Present Illness  Eduardo Murray is a 78 y.o. male POD 1 s/p Lt THA posterolateral approach.  Clinical Impression  Eduardo Murray is a 78 y.o. male admitted for above diagnosis. HE reports being independent at baseline and was mobilizing with no device prior to surgery. Today patient required min assist to perform sit<>stand transfer from EOB and min guard for gait with RW. He was able to ambulate ~ 300 feet with RW and no fatigue. Patient was educated on safe stair mobility and demonstrated good sequencing to ascend/descend 3 stairs with 2 hand rails like his home entrance. Patient was provided handout on initial HEP for LT hip and educated on precautions related to posteroloateral approach. Patient will be safe to discharge home when medically ready and will benefit from additional skilled PT services with home health. Acute will follow and progress mobility as able.   Follow Up Recommendations Home health PT    Equipment Recommendations  None recommended by PT    Recommendations for Other Services       Precautions / Restrictions Precautions Precautions: Fall Restrictions Weight Bearing Restrictions: No      Mobility  Bed Mobility Overal bed mobility: Modified Independent             General bed mobility comments: pt slow to transfer supine to sit with use of bed rails from flat bed, able to scoot LE's to EOB without assistance or cuing  Transfers Overall transfer level: Needs assistance Equipment used: Rolling walker (2 wheeled) Transfers: Sit to/from Stand Sit to Stand: Min assist         General transfer comment: cues for safe hand placement and assist to initiate transfer  Ambulation/Gait Ambulation/Gait assistance: Min guard Gait Distance (Feet): 300 Feet Assistive device: Rolling walker (2 wheeled) Gait  Pattern/deviations: Step-through pattern;Decreased step length - left;Decreased stance time - left;Decreased stride length;Decreased step length - right Gait velocity: indicative of household ambulator   General Gait Details: cues for safe proximety to RW initially and pt improved throughout ambulation  Stairs Stairs: Yes Stairs assistance: Min guard Stair Management: Two rails;Forwards;Step to pattern Number of Stairs: 3 General stair comments: pt ascends/descends 3 stairs with cues and demonstration, pt able to sequence steps with safe pattern "up with Rt/down with Lt"  Wheelchair Mobility    Modified Rankin (Stroke Patients Only)       Balance Overall balance assessment: Needs assistance   Sitting balance-Leahy Scale: Good       Standing balance-Leahy Scale: Fair Standing balance comment: pt reliant on RW for UE support with balance when weight shifting or given slight dynamice challenge in standing                             Pertinent Vitals/Pain Pain Assessment: 0-10 Pain Score: 5  Pain Location: Lt hip Pain Descriptors / Indicators: Aching;Sore Pain Intervention(s): Limited activity within patient's tolerance;Monitored during session;Ice applied    Home Living Family/patient expects to be discharged to:: Private residence Living Arrangements: Spouse/significant other Available Help at Discharge: Family(wife and 2 adult daughters live close by patient) Type of Home: House Home Access: Stairs to enter Entrance Stairs-Rails: Right;Left;Can reach both Entrance Stairs-Number of Steps: 3 Home Layout: One level Home Equipment: Walker - 2 wheels;Walker - 4 wheels;Cane - quad;Cane - single point;Shower seat;Grab bars - tub/shower;Grab bars -  toilet;Wheelchair - manual      Prior Function Level of Independence: Independent         Comments: pt reports prior to surgery he was independent for mobiliy with no assistive device, he is still driving and  independent with ADL's.     Hand Dominance        Extremity/Trunk Assessment   Upper Extremity Assessment Upper Extremity Assessment: Overall WFL for tasks assessed    Lower Extremity Assessment Lower Extremity Assessment: Generalized weakness;RLE deficits/detail;LLE deficits/detail RLE Deficits / Details: Rt LE WFL's for strength of 4/5 or greater for seated hip felxion, knee ext, knee flex, and dorsiflexion LLE Deficits / Details: pt with some weakness limited by pain, 3+ at Rt for flexion and abduction, knee ext/flex 4/5, dorsiflexion 4/5    Cervical / Trunk Assessment Cervical / Trunk Assessment: Normal  Communication   Communication: No difficulties  Cognition Arousal/Alertness: Awake/alert Behavior During Therapy: WFL for tasks assessed/performed Overall Cognitive Status: Within Functional Limits for tasks assessed                                        General Comments      Exercises Total Joint Exercises Ankle Circles/Pumps: AROM;Right;Left;10 reps;Seated Quad Sets: Seated;10 reps;Left(with LE elevated in recliner) Heel Slides: AROM;5 reps;Left;Seated(in reclienr with LE's elevated)   Assessment/Plan    PT Assessment Patient needs continued PT services  PT Problem List Decreased strength;Decreased balance;Pain;Decreased mobility;Decreased range of motion;Decreased activity tolerance       PT Treatment Interventions Gait training;DME instruction;Functional mobility training;Balance training;Patient/family education;Therapeutic activities;Modalities;Stair training;Therapeutic exercise;Manual techniques    PT Goals (Current goals can be found in the Care Plan section)  Acute Rehab PT Goals Patient Stated Goal: pt want to get back home and get back to walking without a RW PT Goal Formulation: With patient Time For Goal Achievement: 04/04/19 Potential to Achieve Goals: Good    Frequency 7X/week    AM-PAC PT "6 Clicks" Mobility  Outcome  Measure Help needed turning from your back to your side while in a flat bed without using bedrails?: A Little Help needed moving from lying on your back to sitting on the side of a flat bed without using bedrails?: A Little Help needed moving to and from a bed to a chair (including a wheelchair)?: A Little Help needed standing up from a chair using your arms (e.g., wheelchair or bedside chair)?: A Little Help needed to walk in hospital room?: A Little Help needed climbing 3-5 steps with a railing? : A Little 6 Click Score: 18    End of Session Equipment Utilized During Treatment: Gait belt Activity Tolerance: Patient tolerated treatment well Patient left: in chair;with call bell/phone within reach Nurse Communication: Mobility status PT Visit Diagnosis: Muscle weakness (generalized) (M62.81);Difficulty in walking, not elsewhere classified (R26.2)    Time: 4034-7425 PT Time Calculation (min) (ACUTE ONLY): 33 min   Charges:   PT Evaluation $PT Eval Low Complexity: 1 Low PT Treatments $Gait Training: 8-22 mins        Kipp Brood, PT, DPT, [facility] Physical Therapist with [facility]  03/25/2019 10:27 AM

## 2019-03-25 NOTE — Plan of Care (Signed)
Plan of care reviewed and discussed with the patient. 

## 2019-03-25 NOTE — Discharge Summary (Signed)
Physician Discharge Summary  Patient ID: Eduardo Murray MRN: [id number] DOB/AGE: 03-Jul-1941 78 y.o.  Admit date: 03/24/2019 Discharge date: 03/25/2019  Admission Diagnoses:  Primary localized osteoarthritis of left hip  Discharge Diagnoses:  Principal Problem:   Primary localized osteoarthritis of left hip   Past Medical History:  Diagnosis Date  . Arthritis   . Diabetes mellitus without complication ([place])    type 2  . Hypertension   . Pneumonia   . Primary localized osteoarthritis of left hip 03/24/2019  . TMJ (temporomandibular joint disorder)     Surgeries: Procedure(s): TOTAL HIP ARTHROPLASTY on 03/24/2019   Consultants (if any):   Discharged Condition: Improved  Hospital Course: Eduardo Murray is an 78 y.o. male who was admitted 03/24/2019 with a diagnosis of Primary localized osteoarthritis of left hip and went to the operating room on 03/24/2019 and underwent the above named procedures.    He was given perioperative antibiotics:  Anti-infectives (From admission, onward)   Start     Dose/Rate Route Frequency Ordered Stop   03/24/19 2000  ceFAZolin (ANCEF) IVPB 2g/100 mL premix     2 g 200 mL/hr over 30 Minutes Intravenous Every 6 hours 03/24/19 1735 03/25/19 0241   03/24/19 1134  ceFAZolin (ANCEF) 2-4 GM/100ML-% IVPB    Note to Pharmacy: Kyra Leyland   : cabinet override      03/24/19 1134 03/24/19 1348   03/24/19 1130  ceFAZolin (ANCEF) IVPB 2g/100 mL premix     2 g 200 mL/hr over 30 Minutes Intravenous On call to O.R. 03/24/19 1122 03/24/19 1418    .  He was given sequential compression devices, early ambulation, and aspirin for DVT prophylaxis.  He benefited maximally from the hospital stay and there were no complications.    Recent vital signs:  Vitals:   03/25/19 1013 03/25/19 1400  BP: 135/73 125/69  Pulse: 75 74  Resp:  16  Temp:  97.7 F (36.5 C)  SpO2: 96% 97%    Recent laboratory studies:  Lab Results  Component Value Date   HGB  10.1 (L) 03/25/2019   HGB 13.4 03/13/2019   HGB 14.6 11/24/2012   Lab Results  Component Value Date   WBC 8.1 03/25/2019   PLT 136 (L) 03/25/2019   No results found for: INR Lab Results  Component Value Date   NA 135 03/25/2019   K 4.3 03/25/2019   CL 99 03/25/2019   CO2 27 03/25/2019   BUN 23 03/25/2019   CREATININE 1.16 03/25/2019   GLUCOSE 165 (H) 03/25/2019    Discharge Medications:   Allergies as of 03/25/2019   No Known Allergies     Medication List    TAKE these medications   aspirin EC 325 MG tablet Take 1 tablet (325 mg total) by mouth 2 (two) times daily. What changed:   medication strength  how much to take  when to take this   baclofen 10 MG tablet Commonly known as: LIORESAL Take 1 tablet (10 mg total) by mouth 3 (three) times daily. As needed for muscle spasm   gabapentin 300 MG capsule Commonly known as: NEURONTIN Take 600 mg by mouth at bedtime.   hydrochlorothiazide 25 MG tablet Commonly known as: HYDRODIURIL Take 25 mg by mouth daily.   HYDROcodone-acetaminophen 10-325 MG tablet Commonly known as: [place] Take 1 tablet by mouth every 6 (six) hours as needed.   lisinopril 40 MG tablet Commonly known as: ZESTRIL Take 40 mg by mouth daily.   metFORMIN  1000 MG tablet Commonly known as: GLUCOPHAGE Take 1,000 mg by mouth 2 (two) times daily with a meal.   ondansetron 4 MG tablet Commonly known as: Zofran Take 1 tablet (4 mg total) by mouth every 8 (eight) hours as needed for nausea or vomiting.   pantoprazole 40 MG tablet Commonly known as: PROTONIX Take 40 mg by mouth daily.   sennosides-docusate sodium 8.6-50 MG tablet Commonly known as: SENOKOT-S Take 2 tablets by mouth daily.   TOUJEO MAX SOLOSTAR [place] Inject 20 Units into the skin daily.   traZODone 50 MG tablet Commonly known as: DESYREL Take 100 mg by mouth at bedtime.   Trulicity 1.77 YO/3.7CH Sopn Generic drug: Dulaglutide Inject 1.5 mg into the skin every Sunday.    vitamin B-12 1000 MCG tablet Commonly known as: CYANOCOBALAMIN Take 1,000 mcg by mouth daily.       Diagnostic Studies: Dg Pelvis Portable  Result Date: 03/24/2019 CLINICAL DATA:  Status post total left hip arthroplasty. Postop images. EXAM: DG HIP (WITH OR WITHOUT PELVIS) 1V PORT LEFT; PORTABLE PELVIS 1-2 VIEWS COMPARISON:  None. FINDINGS: Submitted images show the femoral and acetabular components of the total hip arthroplasty to be well seated and well aligned. There is no acute fracture or evidence of an operative complication. IMPRESSION: Well-positioned total left hip arthroplasty. Electronically Signed   By: Lajean Manes M.D.   On: 03/24/2019 18:14   Dg Hip Port Unilat With Pelvis 1v Left  Result Date: 03/24/2019 CLINICAL DATA:  Status post total left hip arthroplasty. Postop images. EXAM: DG HIP (WITH OR WITHOUT PELVIS) 1V PORT LEFT; PORTABLE PELVIS 1-2 VIEWS COMPARISON:  None. FINDINGS: Submitted images show the femoral and acetabular components of the total hip arthroplasty to be well seated and well aligned. There is no acute fracture or evidence of an operative complication. IMPRESSION: Well-positioned total left hip arthroplasty. Electronically Signed   By: Lajean Manes M.D.   On: 03/24/2019 18:14    Disposition:     Follow-up Information    Marchia Bond, MD. Schedule an appointment as soon as possible for a visit in 2 weeks.   Specialty: Orthopedic Surgery Contact information: [address] [place] [address] [phone]            Signed: Johnny Bridge 03/25/2019, 3:25 PM

## 2019-03-25 NOTE — Anesthesia Postprocedure Evaluation (Signed)
Anesthesia Post Note  Patient: Eduardo Murray  Procedure(s) Performed: TOTAL HIP ARTHROPLASTY (Left Hip)     Patient location during evaluation: PACU Anesthesia Type: Spinal Level of consciousness: oriented and awake and alert Pain management: pain level controlled Vital Signs Assessment: post-procedure vital signs reviewed and stable Respiratory status: spontaneous breathing, respiratory function stable and patient connected to nasal cannula oxygen Cardiovascular status: blood pressure returned to baseline and stable Postop Assessment: no headache, no backache and no apparent nausea or vomiting Anesthetic complications: no    Last Vitals:  Vitals:   03/25/19 0545 03/25/19 0844  BP: 112/64 (!) 116/57  Pulse: 69 70  Resp: 16   Temp: 36.7 C   SpO2: 97%     Last Pain:  Vitals:   03/25/19 0300  TempSrc:   PainSc: Asleep                 Marli Diego L Howard Bunte

## 2019-03-25 NOTE — Progress Notes (Signed)
The pt was provided with d/c instructions. After discussing the pt's plan of care, the pt reported no further questions or concerns. Pt is currently waiting for ride to arrive.

## 2019-03-25 NOTE — Progress Notes (Signed)
     Subjective:  Patient reports pain as mild.  He feels well, although he says that he has not gotten out of bed.  Objective:   VITALS:   Vitals:   03/24/19 2030 03/24/19 2043 03/25/19 0157 03/25/19 0545  BP:  (!) 155/73 116/60 112/64  Pulse: 72 75 78 69  Resp: 16 16 20 16   Temp:  98.4 F (36.9 C) 99.7 F (37.6 C) 98 F (36.7 C)  TempSrc:      SpO2:  95% 95% 97%  Weight:      Height:        Abdomen may be slightly distended, although it soft, the patient just says, "maybe I'm fat". EHL and FHL are intact, leg lengths look equal, surgical dressings are clean.    Lab Results  Component Value Date   WBC 8.1 03/25/2019   HGB 10.1 (L) 03/25/2019   HCT 32.3 (L) 03/25/2019   MCV 91.5 03/25/2019   PLT 136 (L) 03/25/2019   BMET    Component Value Date/Time   NA 135 03/25/2019 0446   K 4.3 03/25/2019 0446   CL 99 03/25/2019 0446   CO2 27 03/25/2019 0446   GLUCOSE 165 (H) 03/25/2019 0446   BUN 23 03/25/2019 0446   CREATININE 1.16 03/25/2019 0446   CALCIUM 7.8 (L) 03/25/2019 0446   GFRNONAA >60 03/25/2019 0446   GFRAA >60 03/25/2019 0446     Assessment/Plan: 1 Day Post-Op   Principal Problem:   Primary localized osteoarthritis of left hip   Advance diet Up with therapy He may be able to be discharged later today if he passes physical therapy and home set up is stable.  If not, we will plan to keep him overnight 1 more night.  Anticipated LOS equal to or greater than 2 midnights due to - Age 13 and older with one or more of the following:  - Obesity  - Expected need for hospital services (PT, OT, Nursing) required for safe  discharge  - Anticipated need for postoperative skilled nursing care or inpatient rehab  - Active co-morbidities: None OR   - Unanticipated findings during/Post Surgery: Slow post-op progression: GI, pain control, mobility  - Patient is a high risk of re-admission due to: None     Johnny Bridge 03/25/2019, 8:16 AM   Marchia Bond, MD Cell [phone]

## 2019-03-25 NOTE — Plan of Care (Signed)
  Problem: Acute Rehab PT Goals(only PT should resolve) Goal: Patient Will Transfer Sit To/From Stand Outcome: Progressing Flowsheets (Taken 03/25/2019 1028) Patient will transfer sit to/from stand: with modified independence Goal: Pt Will Transfer Bed To Chair/Chair To Bed Outcome: Progressing Flowsheets (Taken 03/25/2019 1028) Pt will Transfer Bed to Chair/Chair to Bed: with modified independence Note: With LRAD Goal: Pt Will Ambulate Outcome: Progressing Flowsheets (Taken 03/25/2019 1028) Pt will Ambulate:  > 125 feet  with modified independence  with least restrictive assistive device  with rolling walker Goal: Pt Will Go Up/Down Stairs Outcome: Progressing Flowsheets (Taken 03/25/2019 1028) Pt will Go Up / Down Stairs:  with supervision  3-5 stairs  with rail(s)

## 2019-03-26 DIAGNOSIS — Z87891 Personal history of nicotine dependence: Secondary | ICD-10-CM | POA: Diagnosis not present

## 2019-03-26 DIAGNOSIS — Z471 Aftercare following joint replacement surgery: Secondary | ICD-10-CM | POA: Diagnosis not present

## 2019-03-26 DIAGNOSIS — E119 Type 2 diabetes mellitus without complications: Secondary | ICD-10-CM | POA: Diagnosis not present

## 2019-03-26 DIAGNOSIS — F419 Anxiety disorder, unspecified: Secondary | ICD-10-CM | POA: Diagnosis not present

## 2019-03-26 DIAGNOSIS — Z794 Long term (current) use of insulin: Secondary | ICD-10-CM | POA: Diagnosis not present

## 2019-03-26 DIAGNOSIS — Z9181 History of falling: Secondary | ICD-10-CM | POA: Diagnosis not present

## 2019-03-26 DIAGNOSIS — M26609 Unspecified temporomandibular joint disorder, unspecified side: Secondary | ICD-10-CM | POA: Diagnosis not present

## 2019-03-26 DIAGNOSIS — K219 Gastro-esophageal reflux disease without esophagitis: Secondary | ICD-10-CM | POA: Diagnosis not present

## 2019-03-26 DIAGNOSIS — Z8701 Personal history of pneumonia (recurrent): Secondary | ICD-10-CM | POA: Diagnosis not present

## 2019-03-26 DIAGNOSIS — Z96642 Presence of left artificial hip joint: Secondary | ICD-10-CM | POA: Diagnosis not present

## 2019-03-26 DIAGNOSIS — I10 Essential (primary) hypertension: Secondary | ICD-10-CM | POA: Diagnosis not present

## 2019-04-01 DIAGNOSIS — I10 Essential (primary) hypertension: Secondary | ICD-10-CM | POA: Diagnosis not present

## 2019-04-01 DIAGNOSIS — Z471 Aftercare following joint replacement surgery: Secondary | ICD-10-CM | POA: Diagnosis not present

## 2019-04-01 DIAGNOSIS — Z96642 Presence of left artificial hip joint: Secondary | ICD-10-CM | POA: Diagnosis not present

## 2019-04-01 DIAGNOSIS — Z87891 Personal history of nicotine dependence: Secondary | ICD-10-CM | POA: Diagnosis not present

## 2019-04-01 DIAGNOSIS — F419 Anxiety disorder, unspecified: Secondary | ICD-10-CM | POA: Diagnosis not present

## 2019-04-01 DIAGNOSIS — Z9181 History of falling: Secondary | ICD-10-CM | POA: Diagnosis not present

## 2019-04-01 DIAGNOSIS — E119 Type 2 diabetes mellitus without complications: Secondary | ICD-10-CM | POA: Diagnosis not present

## 2019-04-01 DIAGNOSIS — M26609 Unspecified temporomandibular joint disorder, unspecified side: Secondary | ICD-10-CM | POA: Diagnosis not present

## 2019-04-01 DIAGNOSIS — K219 Gastro-esophageal reflux disease without esophagitis: Secondary | ICD-10-CM | POA: Diagnosis not present

## 2019-04-01 DIAGNOSIS — Z8701 Personal history of pneumonia (recurrent): Secondary | ICD-10-CM | POA: Diagnosis not present

## 2019-04-01 DIAGNOSIS — Z794 Long term (current) use of insulin: Secondary | ICD-10-CM | POA: Diagnosis not present

## 2019-04-06 DIAGNOSIS — Z96642 Presence of left artificial hip joint: Secondary | ICD-10-CM | POA: Diagnosis not present

## 2019-04-15 DIAGNOSIS — Z794 Long term (current) use of insulin: Secondary | ICD-10-CM | POA: Diagnosis not present

## 2019-04-15 DIAGNOSIS — E119 Type 2 diabetes mellitus without complications: Secondary | ICD-10-CM | POA: Diagnosis not present

## 2019-04-15 DIAGNOSIS — K219 Gastro-esophageal reflux disease without esophagitis: Secondary | ICD-10-CM | POA: Diagnosis not present

## 2019-04-15 DIAGNOSIS — Z471 Aftercare following joint replacement surgery: Secondary | ICD-10-CM | POA: Diagnosis not present

## 2019-04-15 DIAGNOSIS — F419 Anxiety disorder, unspecified: Secondary | ICD-10-CM | POA: Diagnosis not present

## 2019-04-15 DIAGNOSIS — Z87891 Personal history of nicotine dependence: Secondary | ICD-10-CM | POA: Diagnosis not present

## 2019-04-15 DIAGNOSIS — M26609 Unspecified temporomandibular joint disorder, unspecified side: Secondary | ICD-10-CM | POA: Diagnosis not present

## 2019-04-15 DIAGNOSIS — I10 Essential (primary) hypertension: Secondary | ICD-10-CM | POA: Diagnosis not present

## 2019-04-15 DIAGNOSIS — Z8701 Personal history of pneumonia (recurrent): Secondary | ICD-10-CM | POA: Diagnosis not present

## 2019-04-15 DIAGNOSIS — Z9181 History of falling: Secondary | ICD-10-CM | POA: Diagnosis not present

## 2019-04-15 DIAGNOSIS — Z96642 Presence of left artificial hip joint: Secondary | ICD-10-CM | POA: Diagnosis not present

## 2019-05-06 DIAGNOSIS — Z96642 Presence of left artificial hip joint: Secondary | ICD-10-CM | POA: Diagnosis not present

## 2019-05-06 DIAGNOSIS — M1612 Unilateral primary osteoarthritis, left hip: Secondary | ICD-10-CM | POA: Diagnosis not present

## 2019-06-03 DIAGNOSIS — Z96642 Presence of left artificial hip joint: Secondary | ICD-10-CM | POA: Diagnosis not present

## 2019-06-03 DIAGNOSIS — G5602 Carpal tunnel syndrome, left upper limb: Secondary | ICD-10-CM | POA: Diagnosis not present

## 2019-06-11 DIAGNOSIS — G5602 Carpal tunnel syndrome, left upper limb: Secondary | ICD-10-CM | POA: Diagnosis not present

## 2019-07-15 DIAGNOSIS — G5602 Carpal tunnel syndrome, left upper limb: Secondary | ICD-10-CM | POA: Diagnosis not present

## 2019-07-16 DIAGNOSIS — G5602 Carpal tunnel syndrome, left upper limb: Secondary | ICD-10-CM | POA: Diagnosis not present

## 2019-08-05 DIAGNOSIS — G5602 Carpal tunnel syndrome, left upper limb: Secondary | ICD-10-CM | POA: Diagnosis not present

## 2019-10-26 DIAGNOSIS — E1165 Type 2 diabetes mellitus with hyperglycemia: Secondary | ICD-10-CM | POA: Diagnosis not present

## 2019-10-26 DIAGNOSIS — I10 Essential (primary) hypertension: Secondary | ICD-10-CM | POA: Diagnosis not present

## 2019-10-26 DIAGNOSIS — Z125 Encounter for screening for malignant neoplasm of prostate: Secondary | ICD-10-CM | POA: Diagnosis not present

## 2019-10-29 DIAGNOSIS — E1165 Type 2 diabetes mellitus with hyperglycemia: Secondary | ICD-10-CM | POA: Diagnosis not present

## 2019-10-29 DIAGNOSIS — Z6828 Body mass index (BMI) 28.0-28.9, adult: Secondary | ICD-10-CM | POA: Diagnosis not present

## 2019-10-29 DIAGNOSIS — E7800 Pure hypercholesterolemia, unspecified: Secondary | ICD-10-CM | POA: Diagnosis not present

## 2019-10-29 DIAGNOSIS — I10 Essential (primary) hypertension: Secondary | ICD-10-CM | POA: Diagnosis not present

## 2019-11-02 DIAGNOSIS — M792 Neuralgia and neuritis, unspecified: Secondary | ICD-10-CM | POA: Diagnosis not present

## 2019-11-02 DIAGNOSIS — M2042 Other hammer toe(s) (acquired), left foot: Secondary | ICD-10-CM | POA: Diagnosis not present

## 2019-11-02 DIAGNOSIS — Z0001 Encounter for general adult medical examination with abnormal findings: Secondary | ICD-10-CM | POA: Diagnosis not present

## 2019-11-02 DIAGNOSIS — I4510 Unspecified right bundle-branch block: Secondary | ICD-10-CM | POA: Diagnosis not present

## 2019-11-02 DIAGNOSIS — M7501 Adhesive capsulitis of right shoulder: Secondary | ICD-10-CM | POA: Diagnosis not present

## 2019-11-02 DIAGNOSIS — M2041 Other hammer toe(s) (acquired), right foot: Secondary | ICD-10-CM | POA: Diagnosis not present

## 2019-11-02 DIAGNOSIS — Z1212 Encounter for screening for malignant neoplasm of rectum: Secondary | ICD-10-CM | POA: Diagnosis not present

## 2019-11-02 DIAGNOSIS — Z23 Encounter for immunization: Secondary | ICD-10-CM | POA: Diagnosis not present

## 2019-11-02 DIAGNOSIS — I10 Essential (primary) hypertension: Secondary | ICD-10-CM | POA: Diagnosis not present

## 2019-11-02 DIAGNOSIS — Z8719 Personal history of other diseases of the digestive system: Secondary | ICD-10-CM | POA: Diagnosis not present

## 2019-11-02 DIAGNOSIS — E1121 Type 2 diabetes mellitus with diabetic nephropathy: Secondary | ICD-10-CM | POA: Diagnosis not present

## 2019-11-06 ENCOUNTER — Other Ambulatory Visit: Payer: Self-pay

## 2019-11-06 ENCOUNTER — Encounter: Payer: Self-pay | Admitting: Family Medicine

## 2019-11-06 ENCOUNTER — Ambulatory Visit: Payer: PPO

## 2019-11-06 ENCOUNTER — Ambulatory Visit (INDEPENDENT_AMBULATORY_CARE_PROVIDER_SITE_OTHER): Payer: PPO

## 2019-11-06 ENCOUNTER — Ambulatory Visit (INDEPENDENT_AMBULATORY_CARE_PROVIDER_SITE_OTHER): Payer: PPO | Admitting: Family Medicine

## 2019-11-06 VITALS — BP 120/68 | HR 90 | Ht 69.0 in | Wt 190.0 lb

## 2019-11-06 DIAGNOSIS — M25511 Pain in right shoulder: Secondary | ICD-10-CM | POA: Diagnosis not present

## 2019-11-06 NOTE — Progress Notes (Signed)
Subjective:    I'm seeing this patient as a consultation for:  Dr. Shelia Media. Note will be routed back to referring provider/PCP.  CC: R shoulder pain  I, Judy Pimple, am serving as a scribe for Dr. Lynne Leader.  HPI: Pt is a 79 y/o male presenting w/ c/o R shoulder pain and decreased ROM for about 3 months. He rates his pain at a 10/10 when raising his hand and describes his pain as sharp shooting.  Radiating pain: no R UE numbness/tingling: no R UE weakness: no Aggravating factors: raising the arm up Treatments tried: ice, heat, tylenol   Past medical history, Surgical history, Family history, Social history, Allergies, and medications have been entered into the medical record, reviewed. Medical records provided by Dr. Shelia Media  Review of Systems: No new headache, visual changes, nausea, vomiting, diarrhea, constipation, dizziness, abdominal pain, skin rash, fevers, chills, night sweats, weight loss, swollen lymph nodes, body aches, joint swelling, muscle aches, chest pain, shortness of breath, mood changes, visual or auditory hallucinations.   Objective:    Vitals:   11/06/19 1311  BP: 120/68  Pulse: 90  SpO2: 96%   General: Well Developed, well nourished, and in no acute distress.  Neuro/Psych: Alert and oriented x3, extra-ocular muscles intact, able to move all 4 extremities, sensation grossly intact. Skin: Warm and dry, no rashes noted.  Respiratory: Not using accessory muscles, speaking in full sentences, trachea midline.  Cardiovascular: Pulses palpable, no extremity edema. Abdomen: Does not appear distended. MSK:  Right shoulder: Normal-appearing Nontender. Range of motion full abduction external and somewhat limited internal rotation lumbar spine. Pain present with abduction arc. Strength: 4/5 abduction.  5/5 internal rotation.  4/5 external rotation. Positive Hawkins and Neer's test. Negative Yergason's and speeds test.  Left shoulder: Normal-appearing normal  motion nontender normal strength.  Lab and Radiology Results  Diagnostic Limited MSK Ultrasound of: Right shoulder Biceps tendon not visualized in bicipital groove.  Possible chronic tear. Subscapularis tendon intact with no obvious tear. Supraspinatus tendon is intact with possible partial tear bursal surface.  No retracted rotator cuff tear visible per my interpretation.. Increased bursal thickness present subacromial bursa Infraspinatus tendon intact no obvious full-thickness tear. 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  No full-thickness retracted rotator cuff tear visible possible chronic old biceps tendon tear.  Procedure: Real-time Ultrasound Guided Injection of right subacromial bursa Device: Philips Affiniti 50G Images permanently stored and available for review in the ultrasound unit. Verbal informed consent obtained.  Discussed risks and benefits of procedure. Warned about infection bleeding damage to structures skin hypopigmentation and fat atrophy among others. Patient expresses understanding and agreement Time-out conducted.   Noted no overlying erythema, induration, or other signs of local infection.   Skin prepped in a sterile fashion.   Local anesthesia: Topical Ethyl chloride.   With sterile technique and under real time ultrasound guidance:  40 mg of Kenalog and 2 mL of Marcaine injected easily.   Completed without difficulty   Pain partially resolved suggesting accurate placement of the medication.   Advised to call if fevers/chills, erythema, induration, drainage, or persistent bleeding.   Images permanently stored and available for review in the ultrasound unit.  Impression: Technically successful ultrasound guided injection.       Impression and Recommendations:    Assessment and Plan: 79 y.o. male with  .  Right shoulder pain ongoing for about 3 months.  Patient has good range of motion  although with pain.  Adhesive capsulitis less likely.  Patient does have changes consistent with rotator cuff tendinopathy with probable partial tears.  No full-thickness retracted rotator cuff tear visible on ultrasound per my examination today.  Plan for subacromial injection.  Patient had moderate immediate benefit of pain indicating multiple pain generators in his shoulder.  Plan for physical therapy along with injection as above and trial of Voltaren gel.  Check back in 4 to 6 weeks.  Return sooner if needed.  Precautions reviewed.  PDMP not reviewed this encounter. Orders Placed This Encounter  Procedures  . [place] LIMITED JOINT SPACE STRUCTURES UP RIGHT    Standing Status:   Future    Number of Occurrences:   1    Standing Expiration Date:   01/05/2021    Order Specific Question:   Reason for Exam (SYMPTOM  OR DIAGNOSIS REQUIRED)    Answer:   Right shoulder pain    Order Specific Question:   Preferred imaging location?    Answer:   [facility]  . DG Shoulder Right    Standing Status:   Future    Number of Occurrences:   1    Standing Expiration Date:   01/05/2021    Order Specific Question:   Reason for Exam (SYMPTOM  OR DIAGNOSIS REQUIRED)    Answer:   eval shoulder pain    Order Specific Question:   Preferred imaging location?    Answer:   [facility]    Order Specific Question:   Radiology Contrast Protocol - do NOT remove file path    Answer:   [email or web address]  . Ambulatory referral to Physical Therapy    Referral Priority:   Routine    Referral Type:   Physical Medicine    Referral Reason:   Specialty Services Required    Requested Specialty:   Physical Therapy   No orders of the defined types were placed in this encounter.   Discussed warning signs or symptoms. Please see discharge instructions. Patient expresses understanding.   The above documentation has been reviewed and is accurate and complete  Lynne Leader

## 2019-11-06 NOTE — Patient Instructions (Signed)
Thank you for coming in today. Call or go to the ER if you develop a large red swollen joint with extreme pain or oozing puss.  Plan for PT.  Recheck with me in 4 weeks.  Return sooner if needed.    Rotator Cuff Tendinitis  Rotator cuff tendinitis is inflammation of the tough, cord-like bands that connect muscle to bone (tendons) in the rotator cuff. The rotator cuff includes all of the muscles and tendons that connect the arm to the shoulder. The rotator cuff holds the head of the upper arm bone (humerus) in the cup (fossa) of the shoulder blade (scapula). This condition can lead to a long-lasting (chronic) tear. The tear may be partial or complete. What are the causes? This condition is usually caused by overusing the rotator cuff. What increases the risk? This condition is more likely to develop in athletes and workers who frequently use their shoulder or reach over their heads. This can include activities such as:  Tennis.  Baseball or softball.  Swimming.  Construction work.  Painting. What are the signs or symptoms? Symptoms of this condition include:  Pain spreading (radiating) from the shoulder to the upper arm.  Swelling and tenderness in front of the shoulder.  Pain when reaching, pulling, or lifting the arm above the head.  Pain when lowering the arm from above the head.  Minor pain in the shoulder when resting.  Increased pain in the shoulder at night.  Difficulty placing the arm behind the back. How is this diagnosed? This condition is diagnosed with a medical history and physical exam. Tests may also be done, including:  X-rays.  MRI.  Ultrasounds.  CT or MR arthrogram. During this test, a contrast material is injected and then images are taken. How is this treated? Treatment for this condition depends on the severity of the condition. In less severe cases, treatment may include:  Rest. This may be done with a sling that holds the shoulder still  (immobilization). Your health care provider may also recommend avoiding activities that involve lifting your arm over your head.  Icing the shoulder.  Anti-inflammatory medicines, such as aspirin or ibuprofen. In more severe cases, treatment may include:  Physical therapy.  Steroid injections.  Surgery. Follow these instructions at home: If you have a sling:  Wear the sling as told by your health care provider. Remove it only as told by your health care provider.  Loosen the sling if your fingers tingle, become numb, or turn cold and blue.  Keep the sling clean.  If the sling is not waterproof, do not let it get wet. Remove it, if allowed, or cover it with a watertight covering when you take a bath or shower. Managing pain, stiffness, and swelling  If directed, put ice on the injured area. ? If you have a removable sling, remove it as told by your health care provider. ? Put ice in a plastic bag. ? Place a towel between your skin and the bag. ? Leave the ice on for 20 minutes, 2-3 times a day.  Move your fingers often to avoid stiffness and to lessen swelling.  Raise (elevate) the injured area above the level of your heart while you are lying down.  Find a comfortable sleeping position or sleep on a recliner, if available. Driving  Do not drive or use heavy machinery while taking prescription pain medicine.  Ask your health care provider when it is safe to drive if you have a sling on  your arm. Activity  Rest your shoulder as told by your health care provider.  Return to your normal activities as told by your health care provider. Ask your health care provider what activities are safe for you.  Do any exercises or stretches as told by your health care provider.  If you do repetitive overhead tasks, take small breaks in between and include stretching exercises as told by your health care provider. General instructions  Do not use any products that contain nicotine or  tobacco, such as cigarettes and e-cigarettes. These can delay healing. If you need help quitting, ask your health care provider.  Take over-the-counter and prescription medicines only as told by your health care provider.  Keep all follow-up visits as told by your health care provider. This is important. Contact a health care provider if:  Your pain gets worse.  You have new pain in your arm, hands, or fingers.  Your pain is not relieved with medicine or does not get better after 6 weeks of treatment.  You have cracking sensations when moving your shoulder in certain directions.  You hear a snapping sound after using your shoulder, followed by severe pain and weakness. Get help right away if:  Your arm, hand, or fingers are numb or tingling.  Your arm, hand, or fingers are swollen or painful or they turn white or blue. Summary  Rotator cuff tendinitis is inflammation of the tough, cord-like bands that connect muscle to bone (tendons) in the rotator cuff.  This condition is usually caused by overusing the rotator cuff, which includes all of the muscles and tendons that connect the arm to the shoulder.  This condition is more likely to develop in athletes and workers who frequently use their shoulder or reach over their heads.  Treatment generally includes rest, anti-inflammatory medicines, and icing. In some cases, physical therapy and steroid injections may be needed. In severe cases, surgery may be needed. This information is not intended to replace advice given to you by your health care provider. Make sure you discuss any questions you have with your health care provider. Document Revised: 12/05/2018 Document Reviewed: 07/30/2016 Elsevier Patient Education  [employer].

## 2019-11-11 DIAGNOSIS — Z86010 Personal history of colonic polyps: Secondary | ICD-10-CM | POA: Diagnosis not present

## 2019-11-11 DIAGNOSIS — K219 Gastro-esophageal reflux disease without esophagitis: Secondary | ICD-10-CM | POA: Diagnosis not present

## 2019-11-11 DIAGNOSIS — E119 Type 2 diabetes mellitus without complications: Secondary | ICD-10-CM | POA: Diagnosis not present

## 2019-11-19 DIAGNOSIS — D123 Benign neoplasm of transverse colon: Secondary | ICD-10-CM | POA: Diagnosis not present

## 2019-11-19 DIAGNOSIS — D128 Benign neoplasm of rectum: Secondary | ICD-10-CM | POA: Diagnosis not present

## 2019-11-19 DIAGNOSIS — K635 Polyp of colon: Secondary | ICD-10-CM | POA: Diagnosis not present

## 2019-11-19 DIAGNOSIS — D124 Benign neoplasm of descending colon: Secondary | ICD-10-CM | POA: Diagnosis not present

## 2019-11-19 DIAGNOSIS — K5730 Diverticulosis of large intestine without perforation or abscess without bleeding: Secondary | ICD-10-CM | POA: Diagnosis not present

## 2019-11-19 DIAGNOSIS — K921 Melena: Secondary | ICD-10-CM | POA: Diagnosis not present

## 2019-11-19 DIAGNOSIS — K621 Rectal polyp: Secondary | ICD-10-CM | POA: Diagnosis not present

## 2019-11-19 DIAGNOSIS — D122 Benign neoplasm of ascending colon: Secondary | ICD-10-CM | POA: Diagnosis not present

## 2019-11-19 DIAGNOSIS — Z86010 Personal history of colonic polyps: Secondary | ICD-10-CM | POA: Diagnosis not present

## 2019-12-10 DIAGNOSIS — E7800 Pure hypercholesterolemia, unspecified: Secondary | ICD-10-CM | POA: Diagnosis not present

## 2019-12-10 DIAGNOSIS — Z6828 Body mass index (BMI) 28.0-28.9, adult: Secondary | ICD-10-CM | POA: Diagnosis not present

## 2019-12-10 DIAGNOSIS — E1165 Type 2 diabetes mellitus with hyperglycemia: Secondary | ICD-10-CM | POA: Diagnosis not present

## 2019-12-10 DIAGNOSIS — I10 Essential (primary) hypertension: Secondary | ICD-10-CM | POA: Diagnosis not present

## 2020-02-03 DIAGNOSIS — R600 Localized edema: Secondary | ICD-10-CM | POA: Diagnosis not present

## 2020-02-04 DIAGNOSIS — E1165 Type 2 diabetes mellitus with hyperglycemia: Secondary | ICD-10-CM | POA: Diagnosis not present

## 2020-02-11 DIAGNOSIS — I10 Essential (primary) hypertension: Secondary | ICD-10-CM | POA: Diagnosis not present

## 2020-02-11 DIAGNOSIS — E1165 Type 2 diabetes mellitus with hyperglycemia: Secondary | ICD-10-CM | POA: Diagnosis not present

## 2020-02-11 DIAGNOSIS — E7800 Pure hypercholesterolemia, unspecified: Secondary | ICD-10-CM | POA: Diagnosis not present

## 2020-02-11 DIAGNOSIS — Z6828 Body mass index (BMI) 28.0-28.9, adult: Secondary | ICD-10-CM | POA: Diagnosis not present

## 2020-03-03 DIAGNOSIS — E1165 Type 2 diabetes mellitus with hyperglycemia: Secondary | ICD-10-CM | POA: Diagnosis not present

## 2020-03-03 DIAGNOSIS — R600 Localized edema: Secondary | ICD-10-CM | POA: Diagnosis not present

## 2020-03-09 DIAGNOSIS — R600 Localized edema: Secondary | ICD-10-CM | POA: Diagnosis not present

## 2020-05-19 ENCOUNTER — Ambulatory Visit (HOSPITAL_COMMUNITY)
Admission: EM | Admit: 2020-05-19 | Discharge: 2020-05-19 | Disposition: A | Discharge status: Home / Self Care | Payer: Medicare HMO

## 2020-05-20 ENCOUNTER — Other Ambulatory Visit: Payer: Medicare HMO

## 2020-05-20 ENCOUNTER — Other Ambulatory Visit: Payer: Self-pay

## 2020-05-20 DIAGNOSIS — Z20822 Contact with and (suspected) exposure to covid-19: Secondary | ICD-10-CM | POA: Diagnosis not present

## 2020-05-20 NOTE — Addendum Note (Signed)
Addended by: Louanne Skye on: 05/20/2020 10:31 AM   Modules accepted: Orders

## 2020-05-21 LAB — SARS-COV-2, NAA 2 DAY TAT

## 2020-05-21 LAB — NOVEL CORONAVIRUS, NAA: SARS-CoV-2, NAA: NOT DETECTED

## 2020-05-26 ENCOUNTER — Emergency Department (HOSPITAL_COMMUNITY)
Admission: EM | Admit: 2020-05-26 | Discharge: 2020-05-27 | Disposition: A | Discharge status: Home / Self Care | Payer: Medicare HMO | Source: Home / Self Care | Attending: Emergency Medicine | Admitting: Emergency Medicine

## 2020-05-26 ENCOUNTER — Emergency Department (HOSPITAL_COMMUNITY): Payer: Medicare HMO

## 2020-05-26 ENCOUNTER — Encounter (HOSPITAL_COMMUNITY): Payer: Self-pay | Admitting: Emergency Medicine

## 2020-05-26 ENCOUNTER — Other Ambulatory Visit: Payer: Self-pay

## 2020-05-26 DIAGNOSIS — Z20822 Contact with and (suspected) exposure to covid-19: Secondary | ICD-10-CM | POA: Diagnosis present

## 2020-05-26 DIAGNOSIS — Z7984 Long term (current) use of oral hypoglycemic drugs: Secondary | ICD-10-CM | POA: Insufficient documentation

## 2020-05-26 DIAGNOSIS — Z794 Long term (current) use of insulin: Secondary | ICD-10-CM | POA: Diagnosis not present

## 2020-05-26 DIAGNOSIS — E876 Hypokalemia: Secondary | ICD-10-CM | POA: Diagnosis present

## 2020-05-26 DIAGNOSIS — N182 Chronic kidney disease, stage 2 (mild): Secondary | ICD-10-CM | POA: Diagnosis present

## 2020-05-26 DIAGNOSIS — E1159 Type 2 diabetes mellitus with other circulatory complications: Secondary | ICD-10-CM | POA: Diagnosis not present

## 2020-05-26 DIAGNOSIS — E871 Hypo-osmolality and hyponatremia: Secondary | ICD-10-CM | POA: Diagnosis present

## 2020-05-26 DIAGNOSIS — Z4682 Encounter for fitting and adjustment of non-vascular catheter: Secondary | ICD-10-CM | POA: Diagnosis not present

## 2020-05-26 DIAGNOSIS — J9811 Atelectasis: Secondary | ICD-10-CM | POA: Diagnosis not present

## 2020-05-26 DIAGNOSIS — R079 Chest pain, unspecified: Secondary | ICD-10-CM | POA: Diagnosis not present

## 2020-05-26 DIAGNOSIS — R0602 Shortness of breath: Secondary | ICD-10-CM | POA: Diagnosis not present

## 2020-05-26 DIAGNOSIS — Z87891 Personal history of nicotine dependence: Secondary | ICD-10-CM | POA: Diagnosis not present

## 2020-05-26 DIAGNOSIS — E1122 Type 2 diabetes mellitus with diabetic chronic kidney disease: Secondary | ICD-10-CM | POA: Diagnosis present

## 2020-05-26 DIAGNOSIS — J90 Pleural effusion, not elsewhere classified: Secondary | ICD-10-CM | POA: Diagnosis present

## 2020-05-26 DIAGNOSIS — R0902 Hypoxemia: Secondary | ICD-10-CM | POA: Diagnosis not present

## 2020-05-26 DIAGNOSIS — D649 Anemia, unspecified: Secondary | ICD-10-CM | POA: Diagnosis present

## 2020-05-26 DIAGNOSIS — Z96642 Presence of left artificial hip joint: Secondary | ICD-10-CM | POA: Diagnosis present

## 2020-05-26 DIAGNOSIS — I152 Hypertension secondary to endocrine disorders: Secondary | ICD-10-CM | POA: Diagnosis not present

## 2020-05-26 DIAGNOSIS — D72825 Bandemia: Secondary | ICD-10-CM | POA: Diagnosis not present

## 2020-05-26 DIAGNOSIS — J189 Pneumonia, unspecified organism: Secondary | ICD-10-CM | POA: Diagnosis present

## 2020-05-26 DIAGNOSIS — J918 Pleural effusion in other conditions classified elsewhere: Secondary | ICD-10-CM | POA: Diagnosis present

## 2020-05-26 DIAGNOSIS — E878 Other disorders of electrolyte and fluid balance, not elsewhere classified: Secondary | ICD-10-CM | POA: Diagnosis present

## 2020-05-26 DIAGNOSIS — D72828 Other elevated white blood cell count: Secondary | ICD-10-CM | POA: Diagnosis present

## 2020-05-26 DIAGNOSIS — I10 Essential (primary) hypertension: Secondary | ICD-10-CM | POA: Insufficient documentation

## 2020-05-26 DIAGNOSIS — E1140 Type 2 diabetes mellitus with diabetic neuropathy, unspecified: Secondary | ICD-10-CM | POA: Diagnosis present

## 2020-05-26 DIAGNOSIS — Z79899 Other long term (current) drug therapy: Secondary | ICD-10-CM | POA: Diagnosis not present

## 2020-05-26 DIAGNOSIS — E119 Type 2 diabetes mellitus without complications: Secondary | ICD-10-CM | POA: Diagnosis not present

## 2020-05-26 DIAGNOSIS — E1165 Type 2 diabetes mellitus with hyperglycemia: Secondary | ICD-10-CM | POA: Diagnosis present

## 2020-05-26 DIAGNOSIS — Z978 Presence of other specified devices: Secondary | ICD-10-CM | POA: Diagnosis not present

## 2020-05-26 DIAGNOSIS — N179 Acute kidney failure, unspecified: Secondary | ICD-10-CM | POA: Diagnosis not present

## 2020-05-26 DIAGNOSIS — I129 Hypertensive chronic kidney disease with stage 1 through stage 4 chronic kidney disease, or unspecified chronic kidney disease: Secondary | ICD-10-CM | POA: Diagnosis present

## 2020-05-26 DIAGNOSIS — J9601 Acute respiratory failure with hypoxia: Secondary | ICD-10-CM | POA: Diagnosis present

## 2020-05-26 DIAGNOSIS — J869 Pyothorax without fistula: Secondary | ICD-10-CM | POA: Diagnosis not present

## 2020-05-26 DIAGNOSIS — A419 Sepsis, unspecified organism: Secondary | ICD-10-CM | POA: Diagnosis present

## 2020-05-26 DIAGNOSIS — Z7982 Long term (current) use of aspirin: Secondary | ICD-10-CM | POA: Insufficient documentation

## 2020-05-26 LAB — COMPREHENSIVE METABOLIC PANEL
ALT: 36 U/L (ref 0–44)
AST: 30 U/L (ref 15–41)
Albumin: 2.5 g/dL — ABNORMAL LOW (ref 3.5–5.0)
Alkaline Phosphatase: 60 U/L (ref 38–126)
Anion gap: 14 (ref 5–15)
BUN: 30 mg/dL — ABNORMAL HIGH (ref 8–23)
CO2: 19 mmol/L — ABNORMAL LOW (ref 22–32)
Calcium: 8.6 mg/dL — ABNORMAL LOW (ref 8.9–10.3)
Chloride: 100 mmol/L (ref 98–111)
Creatinine, Ser: 1.3 mg/dL — ABNORMAL HIGH (ref 0.61–1.24)
GFR calc Af Amer: >60 mL/min (ref >60)
GFR calc non Af Amer: 52 mL/min — ABNORMAL LOW (ref >60)
Glucose, Bld: 204 mg/dL — ABNORMAL HIGH (ref 70–99)
Potassium: 3.8 mmol/L (ref 3.5–5.1)
Sodium: 133 mmol/L — ABNORMAL LOW (ref 135–145)
Total Bilirubin: 0.7 mg/dL (ref 0.3–1.2)
Total Protein: 6.6 g/dL (ref 6.5–8.1)

## 2020-05-26 LAB — CBC WITH DIFFERENTIAL/PLATELET
Abs Immature Granulocytes: 0.67 10*3/uL — ABNORMAL HIGH (ref 0.00–0.07)
Basophils Absolute: 0.1 10*3/uL (ref 0.0–0.1)
Basophils Relative: 1 %
Eosinophils Absolute: 0.3 10*3/uL (ref 0.0–0.5)
Eosinophils Relative: 2 %
HCT: 39.1 % (ref 39.0–52.0)
Hemoglobin: 13.2 g/dL (ref 13.0–17.0)
Immature Granulocytes: 5 %
Lymphocytes Relative: 13 %
Lymphs Abs: 1.7 10*3/uL (ref 0.7–4.0)
MCH: 29.8 pg (ref 26.0–34.0)
MCHC: 33.8 g/dL (ref 30.0–36.0)
MCV: 88.3 fL (ref 80.0–100.0)
Monocytes Absolute: 0.8 10*3/uL (ref 0.1–1.0)
Monocytes Relative: 6 %
Neutro Abs: 9.4 10*3/uL — ABNORMAL HIGH (ref 1.7–7.7)
Neutrophils Relative %: 73 %
Platelets: 404 10*3/uL — ABNORMAL HIGH (ref 150–400)
RBC: 4.43 MIL/uL (ref 4.22–5.81)
RDW: 13.2 % (ref 11.5–15.5)
WBC: 13 10*3/uL — ABNORMAL HIGH (ref 4.0–10.5)
nRBC: 0 % (ref 0.0–0.2)

## 2020-05-26 LAB — TROPONIN I (HIGH SENSITIVITY)
Troponin I (High Sensitivity): 7 ng/L (ref <18)
Troponin I (High Sensitivity): 6 ng/L (ref <18)

## 2020-05-26 MED ORDER — FUROSEMIDE 20 MG PO TABS: 20.0000 mg | ORAL_TABLET | Freq: Every day | ORAL | 0 refills | Status: DC

## 2020-05-26 MED ORDER — FUROSEMIDE 10 MG/ML IJ SOLN
40.0000 mg | Freq: Once | INTRAMUSCULAR | Status: AC
  Administered 2020-05-26: 40 mg via INTRAVENOUS
  Filled 2020-05-26: qty 4

## 2020-05-26 NOTE — ED Notes (Signed)
Pt O2 stayed at 90% and above. Patient was short of breath on return to his room. Pt HR raised to 118 while ambulation.

## 2020-05-26 NOTE — ED Provider Notes (Signed)
[facility] EMERGENCY DEPARTMENT Provider Note   CSN: [id number] Arrival date & time: 05/26/20  1621     History Chief Complaint  Patient presents with   Shortness of Breath    Eduardo Murray is a 79 y.o. male.  Pt presents to the ED today with an increase in SOB.  Pt said he's had worsening of sob since Thursday, 9/23.  Pt denies any f/c.  He has not been vaccinated against Covid.  He did get a Covid swab on 9/24 which was negative.  Pt denies any cp.        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   Pain in the chest 03/12/2014   Type 2 diabetes mellitus without complications ([place]) 26/33/3545   Sternal fracture 03/12/2014   Right bundle branch block 03/12/2014    Past Surgical History:  Procedure Laterality Date   right elbow surgery     right hand surgery     ROTATOR CUFF REPAIR Left    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   Procedure: TOTAL HIP ARTHROPLASTY;  Surgeon: Marchia Bond, MD;  Location: [facility];  Service: Orthopedics;  Laterality: Left;       Family History  Problem Relation Age of Onset   Cancer Father    Diabetes Mother    Cancer Sister    Diabetes Sister    Diabetes Brother     Social History   Tobacco Use   Smoking status: Former Smoker    Packs/day: 1.00    Years: 30.00    Pack years: 30.00    Types: Cigarettes    Quit date: 12/02/1992    Years since quitting: 27.4   Smokeless tobacco: Former Systems developer    Types: Snuff    Quit date: 12/03/2007  Vaping Use   Vaping Use: Never used  Substance Use Topics   Alcohol use: No   Drug use: No    Home Medications Prior to Admission  medications   Medication Sig Start Date End Date Taking? Authorizing Provider  aspirin EC 325 MG tablet Take 1 tablet (325 mg total) by mouth 2 (two) times daily. 03/24/19   Marchia Bond, MD  baclofen (LIORESAL) 10 MG tablet Take 1 tablet (10 mg total) by mouth 3 (three) times daily. As needed for muscle spasm 03/24/19   Marchia Bond, MD  Dulaglutide (TRULICITY) 1.5 GY/5.6LS SOPN Inject 1.5 mg into the skin every Sunday.    [provider]  furosemide (LASIX) 20 MG tablet Take 1 tablet (20 mg total) by mouth daily. 05/26/20   Isla Pence, MD  gabapentin (NEURONTIN) 300 MG capsule Take 600 mg by mouth at bedtime.    [provider]  HYDROcodone-acetaminophen (NORCO) 10-325 MG tablet Take 1 tablet by mouth every 6 (six) hours as needed. 03/24/19   Marchia Bond, MD  Insulin Glargine (TOUJEO MAX SOLOSTAR [place]) Inject 20 Units into the skin daily.    [provider]  lisinopril (ZESTRIL) 40 MG tablet Take 40 mg by mouth daily.    [provider]  metFORMIN (GLUCOPHAGE) 1000 MG tablet Take 1,000 mg by mouth 2 (two) times daily with  a meal.    [provider]  ondansetron (ZOFRAN) 4 MG tablet Take 1 tablet (4 mg total) by mouth every 8 (eight) hours as needed for nausea or vomiting. 03/24/19   Marchia Bond, MD  pantoprazole (PROTONIX) 40 MG tablet Take 40 mg by mouth daily.  02/17/14   [provider]  sennosides-docusate sodium (SENOKOT-S) 8.6-50 MG tablet Take 2 tablets by mouth daily. 03/24/19   Marchia Bond, MD  traZODone (DESYREL) 50 MG tablet Take 100 mg by mouth at bedtime.    [provider]  vitamin B-12 (CYANOCOBALAMIN) 1000 MCG tablet Take 1,000 mcg by mouth daily.    [provider]    Allergies    Patient has no known allergies.  Review of Systems   Review of Systems  Respiratory: Positive for shortness of breath.   All other systems reviewed and are negative.   Physical Exam Updated Vital Signs BP 118/73     Pulse 96    Temp 98.3 F (36.8 C) (Oral)    Resp (!) 21    Ht 5\' 9"  (1.753 m)    Wt 87.1 kg    SpO2 95%    BMI 28.36 kg/m   Physical Exam Vitals and nursing note reviewed.  Constitutional:      Appearance: He is well-developed.  HENT:     Head: Normocephalic and atraumatic.     Mouth/Throat:     Mouth: Mucous membranes are moist.     Pharynx: Oropharynx is clear.  Eyes:     Extraocular Movements: Extraocular movements intact.     Pupils: Pupils are equal, round, and reactive to light.  Cardiovascular:     Rate and Rhythm: Normal rate and regular rhythm.  Pulmonary:     Effort: Pulmonary effort is normal.     Breath sounds: Rhonchi present.  Abdominal:     General: Bowel sounds are normal.     Palpations: Abdomen is soft.  Musculoskeletal:        General: Normal range of motion.     Cervical back: Normal range of motion and neck supple.  Skin:    General: Skin is warm.     Capillary Refill: Capillary refill takes less than 2 seconds.  Neurological:     General: No focal deficit present.     Mental Status: He is alert and oriented to person, place, and time.  Psychiatric:        Mood and Affect: Mood normal.        Behavior: Behavior normal.     ED Results / Procedures / Treatments   Labs (all labs ordered are listed, but only abnormal results are displayed) Labs Reviewed  CBC WITH DIFFERENTIAL/PLATELET - Abnormal; Notable for the following components:      Result Value   WBC 13.0 (*)    Platelets 404 (*)    Neutro Abs 9.4 (*)    Abs Immature Granulocytes 0.67 (*)    All other components within normal limits  COMPREHENSIVE METABOLIC PANEL - Abnormal; Notable for the following components:   Sodium 133 (*)    CO2 19 (*)    Glucose, Bld 204 (*)    BUN 30 (*)    Creatinine, Ser 1.30 (*)    Calcium 8.6 (*)    Albumin 2.5 (*)    GFR calc non Af Amer 52 (*)    All other components within normal limits  RESPIRATORY PANEL BY RT PCR (FLU A&B, COVID)  BRAIN  NATRIURETIC PEPTIDE  TROPONIN I (HIGH SENSITIVITY)  TROPONIN I (HIGH SENSITIVITY)    EKG EKG Interpretation  Date/Time:  Thursday May 26 2020 16:31:59 EDT Ventricular Rate:  109 PR Interval:  164 QRS Duration: 140 QT Interval:  376 QTC Calculation: 506 R Axis:   -70 Text Interpretation: Sinus tachycardia Right bundle branch block Left anterior fascicular block Minimal voltage criteria for LVH, may be normal variant ( R in aVL ) Anterior infarct , age undetermined Abnormal ECG No significant change since last tracing Reconfirmed by Isla Pence [id number]) on 05/26/2020 11:15:09 PM   Radiology DG Chest 2 View  Result Date: 05/26/2020 CLINICAL DATA:  Shortness of breath. EXAM: CHEST - 2 VIEW COMPARISON:  November 24, 2012. FINDINGS: Stable cardiomediastinal silhouette. No pneumothorax is noted. Moderate bilateral pleural effusions are noted with associated atelectasis. Bony thorax is unremarkable. IMPRESSION: Moderate bilateral pleural effusions with associated atelectasis. Electronically Signed   By: Marijo Conception M.D.   On: 05/26/2020 17:18    Procedures Procedures (including critical care time)  Medications Ordered in ED Medications  furosemide (LASIX) injection 40 mg (40 mg Intravenous Given 05/26/20 2239)    ED Course  I have reviewed the triage vital signs and the nursing notes.  Pertinent labs & imaging results that were available during my care of the patient were reviewed by me and considered in my medical decision making (see chart for details).    MDM Rules/Calculators/A&P                          Despite having bilateral pleural effusions, pt looks good.  He will be ambulated to make sure he does not de-sat.  Covid is pending.  Pt will be d/c home on lasix.  HCTZ in d/c'd.  He is to f/u with cards.  Return if worse.  Final Clinical Impression(s) / ED Diagnoses Final diagnoses:  Pleural effusion, bilateral    Rx / DC Orders ED Discharge Orders          Ordered    Ambulatory referral to Cardiology        05/26/20 2301    furosemide (LASIX) 20 MG tablet  Daily        05/26/20 2313           Isla Pence, MD 05/26/20 2315

## 2020-05-26 NOTE — ED Triage Notes (Signed)
Pt states he has increase SOB since last Thursday, denies any fever, chills or pain.

## 2020-05-27 LAB — RESPIRATORY PANEL BY RT PCR (FLU A&B, COVID)
Influenza A by PCR: NEGATIVE
Influenza B by PCR: NEGATIVE
SARS Coronavirus 2 by RT PCR: NEGATIVE

## 2020-05-30 ENCOUNTER — Emergency Department (HOSPITAL_COMMUNITY): Payer: Medicare HMO

## 2020-05-30 ENCOUNTER — Inpatient Hospital Stay (HOSPITAL_COMMUNITY)
Admission: EM | Admit: 2020-05-30 | Discharge: 2020-06-04 | DRG: 871 | Disposition: A | Discharge status: Home Health | Payer: Medicare HMO | Attending: Internal Medicine | Admitting: Internal Medicine

## 2020-05-30 ENCOUNTER — Encounter (HOSPITAL_COMMUNITY): Payer: Self-pay

## 2020-05-30 DIAGNOSIS — E1159 Type 2 diabetes mellitus with other circulatory complications: Secondary | ICD-10-CM | POA: Diagnosis present

## 2020-05-30 DIAGNOSIS — Z20822 Contact with and (suspected) exposure to covid-19: Secondary | ICD-10-CM | POA: Diagnosis present

## 2020-05-30 DIAGNOSIS — R0602 Shortness of breath: Secondary | ICD-10-CM

## 2020-05-30 DIAGNOSIS — Z96642 Presence of left artificial hip joint: Secondary | ICD-10-CM | POA: Diagnosis present

## 2020-05-30 DIAGNOSIS — A419 Sepsis, unspecified organism: Secondary | ICD-10-CM | POA: Diagnosis present

## 2020-05-30 DIAGNOSIS — E871 Hypo-osmolality and hyponatremia: Secondary | ICD-10-CM | POA: Diagnosis present

## 2020-05-30 DIAGNOSIS — Z938 Other artificial opening status: Secondary | ICD-10-CM

## 2020-05-30 DIAGNOSIS — E1122 Type 2 diabetes mellitus with diabetic chronic kidney disease: Secondary | ICD-10-CM | POA: Diagnosis present

## 2020-05-30 DIAGNOSIS — J189 Pneumonia, unspecified organism: Secondary | ICD-10-CM | POA: Diagnosis present

## 2020-05-30 DIAGNOSIS — J9601 Acute respiratory failure with hypoxia: Secondary | ICD-10-CM | POA: Diagnosis present

## 2020-05-30 DIAGNOSIS — Z794 Long term (current) use of insulin: Secondary | ICD-10-CM | POA: Diagnosis not present

## 2020-05-30 DIAGNOSIS — E876 Hypokalemia: Secondary | ICD-10-CM | POA: Diagnosis present

## 2020-05-30 DIAGNOSIS — E878 Other disorders of electrolyte and fluid balance, not elsewhere classified: Secondary | ICD-10-CM | POA: Diagnosis present

## 2020-05-30 DIAGNOSIS — N179 Acute kidney failure, unspecified: Secondary | ICD-10-CM | POA: Diagnosis not present

## 2020-05-30 DIAGNOSIS — E119 Type 2 diabetes mellitus without complications: Secondary | ICD-10-CM | POA: Diagnosis not present

## 2020-05-30 DIAGNOSIS — R0902 Hypoxemia: Secondary | ICD-10-CM | POA: Diagnosis not present

## 2020-05-30 DIAGNOSIS — J90 Pleural effusion, not elsewhere classified: Principal | ICD-10-CM

## 2020-05-30 DIAGNOSIS — I152 Hypertension secondary to endocrine disorders: Secondary | ICD-10-CM | POA: Diagnosis not present

## 2020-05-30 DIAGNOSIS — I10 Essential (primary) hypertension: Secondary | ICD-10-CM | POA: Diagnosis present

## 2020-05-30 DIAGNOSIS — Z9689 Presence of other specified functional implants: Secondary | ICD-10-CM

## 2020-05-30 DIAGNOSIS — Z09 Encounter for follow-up examination after completed treatment for conditions other than malignant neoplasm: Secondary | ICD-10-CM

## 2020-05-30 DIAGNOSIS — N182 Chronic kidney disease, stage 2 (mild): Secondary | ICD-10-CM | POA: Diagnosis present

## 2020-05-30 DIAGNOSIS — E1140 Type 2 diabetes mellitus with diabetic neuropathy, unspecified: Secondary | ICD-10-CM | POA: Diagnosis present

## 2020-05-30 DIAGNOSIS — E1165 Type 2 diabetes mellitus with hyperglycemia: Secondary | ICD-10-CM | POA: Diagnosis present

## 2020-05-30 DIAGNOSIS — J918 Pleural effusion in other conditions classified elsewhere: Secondary | ICD-10-CM | POA: Diagnosis present

## 2020-05-30 DIAGNOSIS — I129 Hypertensive chronic kidney disease with stage 1 through stage 4 chronic kidney disease, or unspecified chronic kidney disease: Secondary | ICD-10-CM | POA: Diagnosis present

## 2020-05-30 DIAGNOSIS — D72828 Other elevated white blood cell count: Secondary | ICD-10-CM | POA: Diagnosis present

## 2020-05-30 DIAGNOSIS — Z79899 Other long term (current) drug therapy: Secondary | ICD-10-CM | POA: Diagnosis not present

## 2020-05-30 DIAGNOSIS — D649 Anemia, unspecified: Secondary | ICD-10-CM | POA: Diagnosis present

## 2020-05-30 DIAGNOSIS — Z87891 Personal history of nicotine dependence: Secondary | ICD-10-CM | POA: Diagnosis not present

## 2020-05-30 DIAGNOSIS — R079 Chest pain, unspecified: Secondary | ICD-10-CM | POA: Diagnosis not present

## 2020-05-30 DIAGNOSIS — D72825 Bandemia: Secondary | ICD-10-CM | POA: Diagnosis not present

## 2020-05-30 DIAGNOSIS — Z4682 Encounter for fitting and adjustment of non-vascular catheter: Secondary | ICD-10-CM | POA: Diagnosis not present

## 2020-05-30 LAB — CBC WITH DIFFERENTIAL/PLATELET
Abs Immature Granulocytes: 0.37 10*3/uL — ABNORMAL HIGH (ref 0.00–0.07)
Basophils Absolute: 0.1 10*3/uL (ref 0.0–0.1)
Basophils Relative: 1 %
Eosinophils Absolute: 0.2 10*3/uL (ref 0.0–0.5)
Eosinophils Relative: 1 %
HCT: 42.8 % (ref 39.0–52.0)
Hemoglobin: 13.6 g/dL (ref 13.0–17.0)
Immature Granulocytes: 3 %
Lymphocytes Relative: 11 %
Lymphs Abs: 1.6 10*3/uL (ref 0.7–4.0)
MCH: 28.5 pg (ref 26.0–34.0)
MCHC: 31.8 g/dL (ref 30.0–36.0)
MCV: 89.7 fL (ref 80.0–100.0)
Monocytes Absolute: 0.8 10*3/uL (ref 0.1–1.0)
Monocytes Relative: 6 %
Neutro Abs: 11.6 10*3/uL — ABNORMAL HIGH (ref 1.7–7.7)
Neutrophils Relative %: 78 %
Platelets: 372 10*3/uL (ref 150–400)
RBC: 4.77 MIL/uL (ref 4.22–5.81)
RDW: 13.2 % (ref 11.5–15.5)
WBC: 14.6 10*3/uL — ABNORMAL HIGH (ref 4.0–10.5)
nRBC: 0 % (ref 0.0–0.2)

## 2020-05-30 LAB — BASIC METABOLIC PANEL
Anion gap: 14 (ref 5–15)
BUN: 21 mg/dL (ref 8–23)
CO2: 27 mmol/L (ref 22–32)
Calcium: 8.7 mg/dL — ABNORMAL LOW (ref 8.9–10.3)
Chloride: 97 mmol/L — ABNORMAL LOW (ref 98–111)
Creatinine, Ser: 1.09 mg/dL (ref 0.61–1.24)
GFR calc Af Amer: >60 mL/min (ref >60)
GFR calc non Af Amer: >60 mL/min (ref >60)
Glucose, Bld: 201 mg/dL — ABNORMAL HIGH (ref 70–99)
Potassium: 3.9 mmol/L (ref 3.5–5.1)
Sodium: 138 mmol/L (ref 135–145)

## 2020-05-30 LAB — CBG MONITORING, ED: Glucose-Capillary: 254 mg/dL — ABNORMAL HIGH (ref 70–99)

## 2020-05-30 LAB — RESPIRATORY PANEL BY RT PCR (FLU A&B, COVID)
Influenza A by PCR: NEGATIVE
Influenza B by PCR: NEGATIVE
SARS Coronavirus 2 by RT PCR: NEGATIVE

## 2020-05-30 LAB — GLUCOSE, CAPILLARY: Glucose-Capillary: 218 mg/dL — ABNORMAL HIGH (ref 70–99)

## 2020-05-30 MED ORDER — AZITHROMYCIN 250 MG PO TABS
500.0000 mg | ORAL_TABLET | Freq: Once | ORAL | Status: AC
  Administered 2020-05-30: 500 mg via ORAL
  Filled 2020-05-30: qty 2

## 2020-05-30 MED ORDER — SODIUM CHLORIDE 0.9 % IV SOLN
500.0000 mg | INTRAVENOUS | Status: DC
  Administered 2020-05-31: 500 mg via INTRAVENOUS
  Filled 2020-05-30 (×3): qty 500

## 2020-05-30 MED ORDER — LISINOPRIL 40 MG PO TABS
40.0000 mg | ORAL_TABLET | Freq: Every day | ORAL | Status: DC
  Administered 2020-05-31 – 2020-06-02 (×3): 40 mg via ORAL
  Filled 2020-05-30 (×3): qty 1

## 2020-05-30 MED ORDER — SODIUM CHLORIDE 0.9% FLUSH
3.0000 mL | Freq: Two times a day (BID) | INTRAVENOUS | Status: DC
  Administered 2020-05-30 – 2020-06-04 (×9): 3 mL via INTRAVENOUS

## 2020-05-30 MED ORDER — PANTOPRAZOLE SODIUM 40 MG PO TBEC
40.0000 mg | DELAYED_RELEASE_TABLET | Freq: Every day | ORAL | Status: DC
  Administered 2020-05-31 – 2020-06-04 (×5): 40 mg via ORAL
  Filled 2020-05-30 (×5): qty 1

## 2020-05-30 MED ORDER — TRIAMTERENE-HCTZ 37.5-25 MG PO TABS
1.0000 | ORAL_TABLET | Freq: Every day | ORAL | Status: DC
  Administered 2020-05-31 – 2020-06-02 (×3): 1 via ORAL
  Filled 2020-05-30 (×3): qty 1

## 2020-05-30 MED ORDER — INSULIN ASPART 100 UNIT/ML ~~LOC~~ SOLN
0.0000 [IU] | Freq: Three times a day (TID) | SUBCUTANEOUS | Status: DC
  Administered 2020-05-30: 5 [IU] via SUBCUTANEOUS
  Administered 2020-05-31: 1 [IU] via SUBCUTANEOUS
  Administered 2020-05-31: 2 [IU] via SUBCUTANEOUS
  Administered 2020-05-31: 1 [IU] via SUBCUTANEOUS
  Administered 2020-06-01: 3 [IU] via SUBCUTANEOUS
  Administered 2020-06-01: 2 [IU] via SUBCUTANEOUS
  Administered 2020-06-01: 7 [IU] via SUBCUTANEOUS
  Administered 2020-06-02 (×2): 2 [IU] via SUBCUTANEOUS
  Administered 2020-06-02: 9 [IU] via SUBCUTANEOUS
  Administered 2020-06-03: 7 [IU] via SUBCUTANEOUS
  Administered 2020-06-03 – 2020-06-04 (×3): 3 [IU] via SUBCUTANEOUS
  Administered 2020-06-04: 7 [IU] via SUBCUTANEOUS

## 2020-05-30 MED ORDER — ENOXAPARIN SODIUM 40 MG/0.4ML ~~LOC~~ SOLN
40.0000 mg | SUBCUTANEOUS | Status: DC
  Administered 2020-05-30: 40 mg via SUBCUTANEOUS
  Filled 2020-05-30: qty 0.4

## 2020-05-30 MED ORDER — INSULIN GLARGINE 100 UNIT/ML ~~LOC~~ SOLN
15.0000 [IU] | Freq: Every day | SUBCUTANEOUS | Status: DC
  Administered 2020-05-30 – 2020-06-02 (×4): 15 [IU] via SUBCUTANEOUS
  Filled 2020-05-30 (×6): qty 0.15

## 2020-05-30 MED ORDER — ACETAMINOPHEN 325 MG PO TABS
650.0000 mg | ORAL_TABLET | Freq: Four times a day (QID) | ORAL | Status: DC | PRN
  Administered 2020-06-02: 650 mg via ORAL
  Filled 2020-05-30: qty 2

## 2020-05-30 MED ORDER — GABAPENTIN 300 MG PO CAPS
600.0000 mg | ORAL_CAPSULE | Freq: Every day | ORAL | Status: DC
  Administered 2020-05-30 – 2020-06-03 (×5): 600 mg via ORAL
  Filled 2020-05-30 (×5): qty 2

## 2020-05-30 MED ORDER — IOHEXOL 300 MG/ML  SOLN
75.0000 mL | Freq: Once | INTRAMUSCULAR | Status: AC | PRN
  Administered 2020-05-30: 75 mL via INTRAVENOUS

## 2020-05-30 MED ORDER — SODIUM CHLORIDE 0.9 % IV SOLN
1.0000 g | Freq: Once | INTRAVENOUS | Status: AC
  Administered 2020-05-30: 1 g via INTRAVENOUS
  Filled 2020-05-30: qty 10

## 2020-05-30 MED ORDER — SODIUM CHLORIDE 0.9 % IV SOLN
2.0000 g | INTRAVENOUS | Status: AC
  Administered 2020-05-31 – 2020-06-04 (×5): 2 g via INTRAVENOUS
  Filled 2020-05-30 (×4): qty 20
  Filled 2020-05-30: qty 2
  Filled 2020-05-30 (×2): qty 20
  Filled 2020-05-30: qty 2
  Filled 2020-05-30: qty 20

## 2020-05-30 MED ORDER — ACETAMINOPHEN 650 MG RE SUPP: 650.0000 mg | Freq: Four times a day (QID) | RECTAL | Status: DC | PRN

## 2020-05-30 MED ORDER — GUAIFENESIN ER 600 MG PO TB12
600.0000 mg | ORAL_TABLET | Freq: Two times a day (BID) | ORAL | Status: DC
  Administered 2020-05-30 – 2020-06-01 (×4): 600 mg via ORAL
  Filled 2020-05-30 (×4): qty 1

## 2020-05-30 NOTE — H&P (Signed)
History and Physical    Eduardo Murray WYO:[id number] DOB: 15-Jan-1941 DOA: 05/30/2020  PCP: Deland Pretty, MD  Patient coming from: Home via EMS  I have personally briefly reviewed patient's old medical records in [place]  Chief Complaint: Shortness of breath  HPI: Eduardo Murray is a 79 y.o. male with medical history significant for type 2 diabetes, hypertension, and RBBB who presents to the ED for evaluation of shortness of breath.  Patient states he has been having 1 week of new shortness of breath.  He has had associated cough productive of clear sputum.  He says he has been having chills, sweats, and rigors.  Patient was initially seen in the ED on 05/26/2020 and found to have bilateral pleural effusions on CXR.  He was otherwise felt to be stable and discharged to home with new prescription of oral Lasix.  Patient states he has been taking Lasix without significant improvement.  He was seen by his PCP earlier today who arranged for patient to return to the ED for further evaluation.  ED Course:  Initial vitals showed BP 157/89, pulse 108, RR 19, temp 98.0 Fahrenheit, SPO2 initially 90% on room air.  Patient placed on 2 L supplemental O2 via [place] with subsequent SPO2 >96%.  Labs show WBC 14.6, hemoglobin 13.6, platelets 372,000, sodium 138, potassium 3.9, bicarb 27, BUN 21, creatinine 1.09, serum glucose 201.  SARS-CoV-2 PCR is negative.  Influenza A/B PCR negative.  Portable chest x-ray showed bilateral effusions left greater than right with increased in left effusion size.  CT chest with contrast showed moderate loculated left pleural effusion with trace to small volume right pleural effusion.  Bilateral lower lobe passive atelectasis also seen.  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  Patient was started on IV ceftriaxone and azithromycin and the hospitalist service was consulted today for further evaluation and management.  Review of Systems: All systems reviewed and are  negative except as documented in history of present illness above.   Past Medical History:  Diagnosis Date  . Arthritis   . Diabetes mellitus without complication ([place])    type 2  . Hypertension   . Pneumonia   . Primary localized osteoarthritis of left hip 03/24/2019  . TMJ (temporomandibular joint disorder)     Past Surgical History:  Procedure Laterality Date  . right elbow surgery    . right hand surgery    . ROTATOR CUFF REPAIR Left   . 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   Procedure: TOTAL HIP ARTHROPLASTY;  Surgeon: Marchia Bond, MD;  Location: [facility];  Service: Orthopedics;  Laterality: Left;    Social History:  reports that he quit smoking about 27 years ago. His smoking use included cigarettes. He has a 30.00 pack-year smoking history. He quit smokeless tobacco use about 12 years ago.  His smokeless tobacco use included snuff. He reports that he does not drink alcohol and does not use drugs.  No Known Allergies  Family History  Problem Relation Age of Onset  . Cancer Father   . Diabetes Mother   . Cancer Sister   . Diabetes Sister   . Diabetes Brother      Prior to Admission medications   Medication Sig Start Date End Date Taking? Authorizing Provider  aspirin EC 325 MG tablet Take 1 tablet (325 mg total) by mouth 2 (two) times daily. 03/24/19   Marchia Bond, MD  baclofen (LIORESAL) 10 MG tablet Take 1 tablet (10 mg total) by  mouth 3 (three) times daily. As needed for muscle spasm 03/24/19   Marchia Bond, MD  Dulaglutide (TRULICITY) 1.5 PP/5.0DT SOPN Inject 1.5 mg into the skin every Sunday.    [provider]  furosemide (LASIX) 20 MG tablet Take 1 tablet (20 mg total) by mouth daily. 05/26/20   Isla Pence, MD  gabapentin (NEURONTIN) 300 MG capsule Take 600 mg by mouth at bedtime.    [provider]  HYDROcodone-acetaminophen (NORCO) 10-325 MG tablet Take 1 tablet by mouth every 6 (six) hours as needed. 03/24/19   Marchia Bond, MD   Insulin Glargine (TOUJEO MAX SOLOSTAR [place]) Inject 20 Units into the skin daily.    [provider]  lisinopril (ZESTRIL) 40 MG tablet Take 40 mg by mouth daily.    [provider]  metFORMIN (GLUCOPHAGE) 1000 MG tablet Take 1,000 mg by mouth 2 (two) times daily with a meal.    [provider]  ondansetron (ZOFRAN) 4 MG tablet Take 1 tablet (4 mg total) by mouth every 8 (eight) hours as needed for nausea or vomiting. 03/24/19   Marchia Bond, MD  pantoprazole (PROTONIX) 40 MG tablet Take 40 mg by mouth daily.  02/17/14   [provider]  sennosides-docusate sodium (SENOKOT-S) 8.6-50 MG tablet Take 2 tablets by mouth daily. 03/24/19   Marchia Bond, MD  traZODone (DESYREL) 50 MG tablet Take 100 mg by mouth at bedtime.    [provider]  vitamin B-12 (CYANOCOBALAMIN) 1000 MCG tablet Take 1,000 mcg by mouth daily.    [provider]    Physical Exam: Vitals:   05/30/20 1830 05/30/20 1900 05/30/20 1915 05/30/20 1930  BP: 133/78     Pulse: 92 97 93 92  Resp: (!) 27 (!) 30 20 (!) 22  Temp:      TempSrc:      SpO2: 94% 96% 96% 97%   Constitutional: Elderly man resting supine in bed, NAD, calm, comfortable Eyes: PERRL, lids and conjunctivae normal ENMT: Mucous membranes are dry. Posterior pharynx clear of any exudate or lesions.poor dentition.  Neck: normal, supple, no masses. Respiratory: Diminished breath sounds at the bases with bibasilar inspiratory crackles otherwise clear to auscultation.  Normal respiratory effort. No accessory muscle use.  Cardiovascular: Regular rate and rhythm, no murmurs / rubs / gallops. No extremity edema. 2+ pedal pulses. Abdomen: no tenderness, no masses palpated. No hepatosplenomegaly. Bowel sounds positive.  Musculoskeletal: no clubbing / cyanosis. No joint deformity upper and lower extremities. Good ROM, no contractures. Normal muscle tone.  Skin: no rashes, lesions, ulcers. No induration Neurologic: CN 2-12  grossly intact. Sensation intact, Strength 5/5 in all 4.  Psychiatric: Normal judgment and insight. Alert and oriented x 3. Normal mood.   Labs on Admission: I have personally reviewed following labs and imaging studies  CBC: Recent Labs  Lab 05/26/20 1645 05/30/20 1551  WBC 13.0* 14.6*  NEUTROABS 9.4* 11.6*  HGB 13.2 13.6  HCT 39.1 42.8  MCV 88.3 89.7  PLT 404* 267   Basic Metabolic Panel: Recent Labs  Lab 05/26/20 1645 05/30/20 1551  NA 133* 138  K 3.8 3.9  CL 100 97*  CO2 19* 27  GLUCOSE 204* 201*  BUN 30* 21  CREATININE 1.30* 1.09  CALCIUM 8.6* 8.7*   GFR: Estimated Creatinine Clearance: 61.1 mL/min (by C-G formula based on SCr of 1.09 mg/dL). Liver Function Tests: Recent Labs  Lab 05/26/20 1645  AST 30  ALT 36  ALKPHOS 60  BILITOT 0.7  PROT  6.6  ALBUMIN 2.5*   No results for input(s): LIPASE, AMYLASE in the last 168 hours. No results for input(s): AMMONIA in the last 168 hours. Coagulation Profile: No results for input(s): INR, PROTIME in the last 168 hours. Cardiac Enzymes: No results for input(s): CKTOTAL, CKMB, CKMBINDEX, TROPONINI in the last 168 hours. BNP (last 3 results) No results for input(s): PROBNP in the last 8760 hours. HbA1C: No results for input(s): HGBA1C in the last 72 hours. CBG: No results for input(s): GLUCAP in the last 168 hours. Lipid Profile: No results for input(s): CHOL, HDL, LDLCALC, TRIG, CHOLHDL, LDLDIRECT in the last 72 hours. Thyroid Function Tests: No results for input(s): TSH, T4TOTAL, FREET4, T3FREE, THYROIDAB in the last 72 hours. Anemia Panel: No results for input(s): VITAMINB12, FOLATE, FERRITIN, TIBC, IRON, RETICCTPCT in the last 72 hours. Urine analysis: No results found for: COLORURINE, APPEARANCEUR, LABSPEC, PHURINE, GLUCOSEU, HGBUR, BILIRUBINUR, KETONESUR, PROTEINUR, UROBILINOGEN, NITRITE, LEUKOCYTESUR  Radiological Exams on Admission: CT Chest W Contrast  Result Date: 05/30/2020 CLINICAL DATA:   Pneumonia, effusion or abscess suspected, xray done EXAM: CT CHEST WITH CONTRAST TECHNIQUE: Multidetector CT imaging of the chest was performed during intravenous contrast administration. CONTRAST:  8mL OMNIPAQUE IOHEXOL 300 MG/ML  SOLN COMPARISON:  Chest x-ray 05/30/2020. FINDINGS: Cardiovascular: Normal heart size. No significant pericardial effusion. 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 The thoracic aorta is normal in caliber. No atherosclerotic plaque of the thoracic aorta. Mild left anterior descending coronary artery calcifications. The main pulmonary artery is normal in caliber. No central or segmental pulmonary embolus. Mediastinum/Nodes: No enlarged mediastinal, hilar, or axillary lymph nodes. Thyroid gland, trachea, and esophagus demonstrate no significant findings. Lungs/Pleura: Incidentally noted azygos fissure. Bilateral lower lobe passive atelectasis that appears slightly hypodense and heterogeneous. Linear atelectasis versus scarring within the lungs. No definite focal consolidation. Calcified granuloma at the right base. Traction bronchiectasis within bilateral lower lobes. Mild bronchial wall thickening within bilateral lower lobes. No pulmonary mass within the aerated lungs. Moderate left pleural effusion that appears to be loculated at the apex (3:36. Trace to small volume right pleural effusion. No pneumothorax. Upper Abdomen: No acute abnormality. Musculoskeletal: No chest wall abnormality No suspicious lytic or blastic osseous lesions. Old healed sternal fracture. No acute displaced fracture. Multilevel degenerative changes of the spine. IMPRESSION: 1. Moderate loculated left pleural effusion. 2. Trace to small volume right pleural effusion. 3. Bilateral lower lobe passive atelectasis with underlying infection/inflammation not excluded. 4. 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  Aortic Atherosclerosis (ICD10-I70.0) - mild. Mild left anterior descending coronary artery calcification. Electronically Signed   By: Iven Finn M.D.   On: 05/30/2020 19:12   DG Chest Portable 1 View  Result Date: 05/30/2020 CLINICAL DATA:  Shortness of breath for several days EXAM: PORTABLE CHEST 1 VIEW COMPARISON:  05/26/2020 FINDINGS: Cardiac shadow is stable. Aortic calcifications are again seen. Bilateral pleural effusions are noted left greater than right. The right is improved when compare with the prior exam although the left appears increased when compared prior exam underlying atelectasis/infiltrate is likely present. No bony abnormality is noted at this time. IMPRESSION: Bilateral effusions left greater than right. The left effusion has increased in the interval from the prior exam. Right effusion appears improved. Electronically Signed   By: Inez Catalina M.D.   On: 05/30/2020 16:46    EKG: Independently reviewed. Sinus rhythm, RBBB and LAFB, motion artifact.  Not significantly changed  when compared to prior.  Assessment/Plan Principal Problem:   Sepsis due to pneumonia [facility]) Active Problems:   Type 2 diabetes mellitus without complications ([facility])   Loculated pleural effusion   Hypertension associated with diabetes ([place])  Eduardo Murray is a 79 y.o. male with medical history significant for type 2 diabetes and hypertension who is admitted with community-acquired pneumonia with associated loculated left pleural effusion.   Sepsis due to community-acquired pneumonia with left-sided loculated pleural effusion: Patient presented with leukocytosis 14.6, tachycardia up to 112, and tachypnea with RR up to 30.  CT shows left-sided loculated effusion suspicious for pneumonia.  SPO2 90% on room air, improved on 2 L O2 via [place].  Covid is negative.  He has been started on empiric antibiotics. -Continue IV ceftriaxone and azithromycin -Obtain sputum culture, strep pneumonia and Legionella  urinary antigens -Obtain blood cultures -Continue supplemental oxygen as needed  Type 2 diabetes: Continue reduced home insulin glargine 15 units nightly, add sensitive SSI while in hospital.  Holding home Metformin.  Check A1c.  Hypertension: Stable, continue home lisinopril and triamterene-HCTZ.  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  Unclear significance, seems to be asymptomatic from this.  Check echocardiogram.  DVT prophylaxis: Lovenox Code Status: Full code, confirmed with patient Family Communication: Discussed with patient, he has discussed with family Disposition Plan: From home and likely discharge to home pending symptomatic improvement and ability to transition to oral antibiotics Consults called: None Admission status:  Status is: Inpatient  Remains inpatient appropriate because:IV treatments appropriate due to intensity of illness or inability to take PO and Inpatient level of care appropriate due to severity of illness   Dispo: The patient is from: Home              Anticipated d/c is to: Home              Anticipated d/c date is: 3 days              Patient currently is not medically stable to d/c.   Zada Finders MD [facility]  If 7PM-7AM, please contact night-coverage [email or web address]  05/30/2020, 9:28 PM

## 2020-05-30 NOTE — ED Provider Notes (Signed)
[facility] EMERGENCY DEPARTMENT Provider Note   CSN: [id number] Arrival date & time: 05/30/20  1530     History Chief Complaint  Patient presents with  . Shortness of Breath    Eduardo Murray is a 79 y.o. male with past medical history of hypertension, diabetes, arthritis who presents with shortness of breath.  Patient has had shortness of breath for 1-1/2 weeks.  He was seen in the emergency department on 9/30, were he was diagnosed with bilateral pleural effusions.  He was given Lasix at that time.  Discharged home, and had follow-up with his PCP today.  His symptoms have not improved, but have not progressed.  Based upon this, his PCP sent him back to the emergency department.  Denies fevers, chest pain, nausea, vomiting.  He is having some coughing, especially at night.  Patient is not hypoxic, but feels more comfortable on 2 L nasal cannula.   Shortness of Breath Severity:  Moderate Onset quality:  Gradual Duration:  10 days Timing:  Constant Progression:  Unchanged Relieved by:  Nothing Worsened by:  Nothing Ineffective treatments:  Diuretics Associated symptoms: cough   Associated symptoms: no abdominal pain, no chest pain, no ear pain, no fever, no rash, no sore throat and no vomiting        Past Medical History:  Diagnosis Date  . Arthritis   . Diabetes mellitus without complication ([place])    type 2  . Hypertension   . Pneumonia   . Primary localized osteoarthritis of left hip 03/24/2019  . TMJ (temporomandibular joint disorder)     Patient Active Problem List   Diagnosis Date Noted  . Primary localized osteoarthritis of left hip 03/24/2019  . Pain in the chest 03/12/2014  . Type 2 diabetes mellitus without complications ([place]) 65/78/4696  . Sternal fracture 03/12/2014  . Right bundle branch block 03/12/2014    Past Surgical History:  Procedure Laterality Date  . right elbow surgery    . right hand surgery    . ROTATOR CUFF REPAIR  Left   . 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 Cancer Father   . Diabetes Mother   . Cancer Sister   . Diabetes Sister   . Diabetes Brother     Social History   Tobacco Use  . Smoking status: Former Smoker    Packs/day: 1.00    Years: 30.00    Pack years: 30.00    Types: Cigarettes    Quit date: 12/02/1992    Years since quitting: 27.5  . Smokeless tobacco: Former Systems developer    Types: Snuff    Quit date: 12/03/2007  Vaping Use  . Vaping Use: Never used  Substance Use Topics  . Alcohol use: No  . Drug use: No    Home Medications Prior to Admission medications   Medication Sig Start Date End Date Taking? Authorizing Provider  aspirin EC 325 MG tablet Take 1 tablet (325 mg total) by mouth 2 (two) times daily. 03/24/19   Marchia Bond, MD  baclofen (LIORESAL) 10 MG tablet Take 1 tablet (10 mg total) by mouth 3 (three) times daily. As needed for muscle spasm 03/24/19   Marchia Bond, MD  Dulaglutide (TRULICITY) 1.5 EX/5.2WU SOPN Inject 1.5 mg into the skin every Sunday.  [provider]  furosemide (LASIX) 20 MG tablet Take 1 tablet (20 mg total) by mouth daily. 05/26/20   Isla Pence, MD  gabapentin (NEURONTIN) 300 MG capsule Take 600 mg by mouth at bedtime.    [provider]  HYDROcodone-acetaminophen (NORCO) 10-325 MG tablet Take 1 tablet by mouth every 6 (six) hours as needed. 03/24/19   Marchia Bond, MD  Insulin Glargine (TOUJEO MAX SOLOSTAR [place]) Inject 20 Units into the skin daily.    [provider]  lisinopril (ZESTRIL) 40 MG tablet Take 40 mg by mouth daily.    [provider]  metFORMIN (GLUCOPHAGE) 1000 MG tablet Take 1,000 mg by mouth 2 (two) times daily with a meal.    [provider]  ondansetron (ZOFRAN) 4 MG tablet Take 1 tablet (4 mg  total) by mouth every 8 (eight) hours as needed for nausea or vomiting. 03/24/19   Marchia Bond, MD  pantoprazole (PROTONIX) 40 MG tablet Take 40 mg by mouth daily.  02/17/14   [provider]  sennosides-docusate sodium (SENOKOT-S) 8.6-50 MG tablet Take 2 tablets by mouth daily. 03/24/19   Marchia Bond, MD  traZODone (DESYREL) 50 MG tablet Take 100 mg by mouth at bedtime.    [provider]  vitamin B-12 (CYANOCOBALAMIN) 1000 MCG tablet Take 1,000 mcg by mouth daily.    [provider]    Allergies    Patient has no known allergies.  Review of Systems   Review of Systems  Constitutional: Negative for chills and fever.  HENT: Negative for ear pain and sore throat.   Eyes: Negative for pain and visual disturbance.  Respiratory: Positive for cough and shortness of breath.   Cardiovascular: Negative for chest pain and palpitations.  Gastrointestinal: Negative for abdominal pain and vomiting.  Genitourinary: Negative for dysuria and hematuria.  Musculoskeletal: Negative for arthralgias and back pain.  Skin: Negative for color change and rash.  Neurological: Negative for seizures and syncope.  All other systems reviewed and are negative.   Physical Exam Updated Vital Signs BP 133/78   Pulse 92   Temp 98 F (36.7 C) (Oral)   Resp (!) 22   SpO2 97%   Physical Exam Vitals and nursing note reviewed.  Constitutional:      Appearance: He is well-developed.  HENT:     Head: Normocephalic and atraumatic.  Eyes:     Conjunctiva/sclera: Conjunctivae normal.  Cardiovascular:     Rate and Rhythm: Normal rate and regular rhythm.     Heart sounds: No murmur heard.   Pulmonary:     Effort: Pulmonary effort is normal. No respiratory distress.     Breath sounds: Examination of the right-middle field reveals decreased breath sounds. Examination of the right-lower field reveals decreased breath sounds. Examination of the left-lower field reveals decreased breath  sounds. Decreased breath sounds present.     Comments: Coarse breath sounds throughout Abdominal:     Palpations: Abdomen is soft.     Tenderness: There is no abdominal tenderness.  Musculoskeletal:     Cervical back: Neck supple.     Right lower leg: No edema.     Left lower leg: No edema.  Skin:    General: Skin is warm and dry.  Neurological:     General: No focal deficit present.     Mental Status: He is alert and oriented to person, place, and time.     ED Results / Procedures / Treatments   Labs (all labs ordered  are listed, but only abnormal results are displayed) Labs Reviewed  CBC WITH DIFFERENTIAL/PLATELET - Abnormal; Notable for the following components:      Result Value   WBC 14.6 (*)    Neutro Abs 11.6 (*)    Abs Immature Granulocytes 0.37 (*)    All other components within normal limits  BASIC METABOLIC PANEL - Abnormal; Notable for the following components:   Chloride 97 (*)    Glucose, Bld 201 (*)    Calcium 8.7 (*)    All other components within normal limits  RESPIRATORY PANEL BY RT PCR (FLU A&B, COVID)    EKG EKG Interpretation  Date/Time:  Monday May 30 2020 15:47:54 EDT Ventricular Rate:  105 PR Interval:    QRS Duration: 145 QT Interval:  356 QTC Calculation: 471 R Axis:   -71 Text Interpretation: Ectopic atrial tachycardia, unifocal Atrial premature complex RBBB and LAFB Inferior infarct, acute Lateral leads are also involved since last tracing no significant change Confirmed by Noemi Chapel [phone]) on 05/30/2020 7:44:46 PM   Radiology CT Chest W Contrast  Result Date: 05/30/2020 CLINICAL DATA:  Pneumonia, effusion or abscess suspected, xray done EXAM: CT CHEST WITH CONTRAST TECHNIQUE: Multidetector CT imaging of the chest was performed during intravenous contrast administration. CONTRAST:  28mL OMNIPAQUE IOHEXOL 300 MG/ML  SOLN COMPARISON:  Chest x-ray 05/30/2020. FINDINGS: Cardiovascular: Normal heart size. No significant pericardial  effusion. 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 The thoracic aorta is normal in caliber. No atherosclerotic plaque of the thoracic aorta. Mild left anterior descending coronary artery calcifications. The main pulmonary artery is normal in caliber. No central or segmental pulmonary embolus. Mediastinum/Nodes: No enlarged mediastinal, hilar, or axillary lymph nodes. Thyroid gland, trachea, and esophagus demonstrate no significant findings. Lungs/Pleura: Incidentally noted azygos fissure. Bilateral lower lobe passive atelectasis that appears slightly hypodense and heterogeneous. Linear atelectasis versus scarring within the lungs. No definite focal consolidation. Calcified granuloma at the right base. Traction bronchiectasis within bilateral lower lobes. Mild bronchial wall thickening within bilateral lower lobes. No pulmonary mass within the aerated lungs. Moderate left pleural effusion that appears to be loculated at the apex (3:36. Trace to small volume right pleural effusion. No pneumothorax. Upper Abdomen: No acute abnormality. Musculoskeletal: No chest wall abnormality No suspicious lytic or blastic osseous lesions. Old healed sternal fracture. No acute displaced fracture. Multilevel degenerative changes of the spine. IMPRESSION: 1. Moderate loculated left pleural effusion. 2. Trace to small volume right pleural effusion. 3. Bilateral lower lobe passive atelectasis with underlying infection/inflammation not excluded. 4. 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 Aortic Atherosclerosis (ICD10-I70.0) - mild. Mild left anterior descending coronary artery calcification. Electronically Signed   By: Iven Finn M.D.   On: 05/30/2020 19:12   DG Chest Portable 1 View  Result Date: 05/30/2020 CLINICAL DATA:  Shortness of breath for several days EXAM: PORTABLE CHEST 1 VIEW COMPARISON:  05/26/2020 FINDINGS:  Cardiac shadow is stable. Aortic calcifications are again seen. Bilateral pleural effusions are noted left greater than right. The right is improved when compare with the prior exam although the left appears increased when compared prior exam underlying atelectasis/infiltrate is likely present. No bony abnormality is noted at this time. IMPRESSION: Bilateral effusions left greater than right. The left effusion has increased in the interval from the prior exam. Right effusion appears improved. Electronically Signed   By: Inez Catalina M.D.   On: 05/30/2020 16:46  Procedures Procedures (including critical care time)  Medications Ordered in ED Medications  cefTRIAXone (ROCEPHIN) 1 g in sodium chloride 0.9 % 100 mL IVPB (1 g Intravenous New Bag/Given 05/30/20 2001)  iohexol (OMNIPAQUE) 300 MG/ML solution 75 mL (75 mLs Intravenous Contrast Given 05/30/20 1847)  azithromycin (ZITHROMAX) tablet 500 mg (500 mg Oral Given 05/30/20 2002)    ED Course  I have reviewed the triage vital signs and the nursing notes.  Pertinent labs & imaging results that were available during my care of the patient were reviewed by me and considered in my medical decision making (see chart for details).    MDM Rules/Calculators/A&P                         WBC 14.6, increased from 13.0 on 9/30.  BMP unremarkable.  Chest x-ray showed left greater than right bilateral effusions, left effusion has increased, but right effusion is improved.  Chest CT showed loculated left effusion and atelectasis, cannot rule out underlying pneumonia.  Will treat for CAP and admit patient for rule out empyema.  This patient was seen with Dr. Sabra Heck.  Final Clinical Impression(s) / ED Diagnoses Final diagnoses:  Bilateral pleural effusion  Community acquired pneumonia, unspecified laterality    Rx / DC Orders ED Discharge Orders    None       Asencion Noble, MD 05/30/20 2111    Noemi Chapel, MD 05/31/20 [phone]

## 2020-05-30 NOTE — Progress Notes (Signed)
New Admission Note:  Arrival Method: via stretcher from ED Mental Orientation: Alert & Oriented x4 Telemetry: CCMD verified. Box-08 Assessment: Completed Skin: Refer to flowsheet IV: Right AC Pain: 0/10 Safety Measures: Safety Fall Prevention Plan discussed with patient. Admission: Completed 5 [place] Orientation: Patient has been orientated to the room, unit and the staff.  Orders have been reviewed and are being implemented. Will continue to monitor the patient. Call light has been placed within reach and bed alarm has been activated.   Vassie Moselle, RN  Phone Number: [phone]

## 2020-05-30 NOTE — ED Triage Notes (Signed)
Per ems pt here for SOB x 1.5 weeks. Pt is coming from his doctors office. On RA 90%. With 2L pt at 96-97% pt was diagnosed with bilateral pleural effusions last week. Pt denies any pain. Pt alert and oriented x4.

## 2020-05-30 NOTE — ED Provider Notes (Signed)
Recently seen witih pleural effusions uncahnged SOB - saw PCP - told him to come back Vitals:   05/30/20 1536  SpO2: 98%   On 2 L of [place],  Dec BS at the bases.  - has some occasional coughing.  Likely needs to be admitted for loculated effusions on CT scan  I saw and evaluated the patient, reviewed the resident's note and I agree with the findings and plan.  I was personally present and directly supervised the following procedures:  Medical Rescucitation  I personally interpreted the EKG as well as the resident and agree with the interpretation on the resident's chart.  Final diagnoses:  None      Noemi Chapel, MD 05/31/20 [phone]

## 2020-05-31 ENCOUNTER — Inpatient Hospital Stay (HOSPITAL_COMMUNITY): Payer: Medicare HMO

## 2020-05-31 ENCOUNTER — Other Ambulatory Visit: Payer: Self-pay

## 2020-05-31 ENCOUNTER — Other Ambulatory Visit (HOSPITAL_COMMUNITY): Payer: Medicare HMO

## 2020-05-31 DIAGNOSIS — R0602 Shortness of breath: Secondary | ICD-10-CM

## 2020-05-31 DIAGNOSIS — I152 Hypertension secondary to endocrine disorders: Secondary | ICD-10-CM

## 2020-05-31 DIAGNOSIS — E119 Type 2 diabetes mellitus without complications: Secondary | ICD-10-CM

## 2020-05-31 DIAGNOSIS — J90 Pleural effusion, not elsewhere classified: Secondary | ICD-10-CM

## 2020-05-31 DIAGNOSIS — E1159 Type 2 diabetes mellitus with other circulatory complications: Secondary | ICD-10-CM

## 2020-05-31 LAB — BASIC METABOLIC PANEL
Anion gap: 11 (ref 5–15)
BUN: 21 mg/dL (ref 8–23)
CO2: 28 mmol/L (ref 22–32)
Calcium: 8.5 mg/dL — ABNORMAL LOW (ref 8.9–10.3)
Chloride: 99 mmol/L (ref 98–111)
Creatinine, Ser: 1 mg/dL (ref 0.61–1.24)
GFR calc Af Amer: >60 mL/min (ref >60)
GFR calc non Af Amer: >60 mL/min (ref >60)
Glucose, Bld: 91 mg/dL (ref 70–99)
Potassium: 3.4 mmol/L — ABNORMAL LOW (ref 3.5–5.1)
Sodium: 138 mmol/L (ref 135–145)

## 2020-05-31 LAB — CBC
HCT: 39.5 % (ref 39.0–52.0)
Hemoglobin: 12.7 g/dL — ABNORMAL LOW (ref 13.0–17.0)
MCH: 28.9 pg (ref 26.0–34.0)
MCHC: 32.2 g/dL (ref 30.0–36.0)
MCV: 89.8 fL (ref 80.0–100.0)
Platelets: 280 10*3/uL (ref 150–400)
RBC: 4.4 MIL/uL (ref 4.22–5.81)
RDW: 13.2 % (ref 11.5–15.5)
WBC: 10.1 10*3/uL (ref 4.0–10.5)
nRBC: 0 % (ref 0.0–0.2)

## 2020-05-31 LAB — GLUCOSE, CAPILLARY
Glucose-Capillary: 178 mg/dL — ABNORMAL HIGH (ref 70–99)
Glucose-Capillary: 140 mg/dL — ABNORMAL HIGH (ref 70–99)
Glucose-Capillary: 135 mg/dL — ABNORMAL HIGH (ref 70–99)
Glucose-Capillary: 326 mg/dL — ABNORMAL HIGH (ref 70–99)

## 2020-05-31 LAB — HEMOGLOBIN A1C
Hgb A1c MFr Bld: 7.1 % — ABNORMAL HIGH (ref 4.8–5.6)
Mean Plasma Glucose: 157.07 mg/dL

## 2020-05-31 LAB — ECHOCARDIOGRAM COMPLETE
Area-P 1/2: 1.62 cm2
Height: 69 in
S' Lateral: 2.6 cm
Weight: 2825.42 oz

## 2020-05-31 MED ORDER — MIDAZOLAM HCL 2 MG/2ML IJ SOLN
INTRAMUSCULAR | Status: AC
  Filled 2020-05-31: qty 2

## 2020-05-31 MED ORDER — MIDAZOLAM HCL 2 MG/2ML IJ SOLN
INTRAMUSCULAR | Status: AC | PRN
  Administered 2020-05-31: 0.5 mg via INTRAVENOUS
  Administered 2020-05-31: 1 mg via INTRAVENOUS

## 2020-05-31 MED ORDER — FENTANYL CITRATE (PF) 100 MCG/2ML IJ SOLN
INTRAMUSCULAR | Status: AC
Start: 2020-05-31 — End: 2020-06-01
  Filled 2020-05-31: qty 2

## 2020-05-31 MED ORDER — FENTANYL CITRATE (PF) 100 MCG/2ML IJ SOLN
INTRAMUSCULAR | Status: AC | PRN
  Administered 2020-05-31: 25 ug via INTRAVENOUS
  Administered 2020-05-31: 50 ug via INTRAVENOUS

## 2020-05-31 MED ORDER — ENSURE MAX PROTEIN PO LIQD
11.0000 [oz_av] | Freq: Two times a day (BID) | ORAL | Status: DC
  Administered 2020-05-31 – 2020-06-04 (×5): 11 [oz_av] via ORAL
  Filled 2020-05-31 (×10): qty 330

## 2020-05-31 MED ORDER — ONDANSETRON HCL 4 MG/2ML IJ SOLN: 4.0000 mg | Freq: Four times a day (QID) | INTRAMUSCULAR | Status: DC | PRN

## 2020-05-31 MED ORDER — ENOXAPARIN SODIUM 40 MG/0.4ML ~~LOC~~ SOLN
40.0000 mg | SUBCUTANEOUS | Status: DC
  Administered 2020-06-01 – 2020-06-04 (×4): 40 mg via SUBCUTANEOUS
  Filled 2020-05-31 (×4): qty 0.4

## 2020-05-31 MED ORDER — ADULT MULTIVITAMIN W/MINERALS CH
1.0000 | ORAL_TABLET | Freq: Every day | ORAL | Status: DC
  Administered 2020-05-31 – 2020-06-04 (×5): 1 via ORAL
  Filled 2020-05-31 (×5): qty 1

## 2020-05-31 NOTE — Progress Notes (Signed)
Echocardiogram 2D Echocardiogram has been performed.  [name] 05/31/2020, 10:21 AM

## 2020-05-31 NOTE — Consult Note (Addendum)
Reason for Consult: Pleural effusion Referring Physician: Hospitalist  Eduardo Murray is an 79 y.o. male.  HPI: The patient is a 79 year old male who presented to the emergency department due to shortness of breath.  He has had symptoms for approximately 2 weeks with associated cough and production of clear sputum.  Additionally, he has been having chills, sweats and rigors.  He was seen in the emergency department on 05/26/2020 and found to have bilateral pleural effusions on chest x-ray.  The left side appears significantly larger than the right.  CT scan has confirmed these findings. IT  is also associated with some passive atelectasis.  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  An echocardiogram is to be obtained.  The patient was initially placed on Lasix but symptoms have not improved.  He was seen by his primary care physician and return to the ER on 05/30/2020 for further management.  On both states he was tested negative for Covid.  Influenza a/B PCR negative as well.  He has been placed on intravenous ceftriaxone and azithromycin.  On admission he did have an elevated white blood cell count of 14.6.  He does have an a left shift on CBC differential.  He was admitted for further management for community acquired pneumonia.  Past Medical History:  Diagnosis Date  . Arthritis   . Diabetes mellitus without complication ([place])    type 2  . Hypertension   . Pneumonia   . Primary localized osteoarthritis of left hip 03/24/2019  . TMJ (temporomandibular joint disorder)     Past Surgical History:  Procedure Laterality Date  . right elbow surgery    . right hand surgery    . ROTATOR CUFF REPAIR Left   . 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 Cancer Father   . Diabetes Mother   .  Cancer Sister   . Diabetes Sister   . Diabetes Brother     Social History:  reports that he quit smoking about 27 years ago. His smoking use included cigarettes. He has a 30.00 pack-year smoking history. He quit smokeless tobacco use about 12 years ago.  His smokeless tobacco use included snuff. He reports that he does not drink alcohol and does not use drugs.  Allergies: No Known Allergies  Scheduled Meds: . enoxaparin (LOVENOX) injection  40 mg Subcutaneous Q24H  . gabapentin  600 mg Oral QHS  . guaiFENesin  600 mg Oral BID  . insulin aspart  0-9 Units Subcutaneous TID WC  . insulin glargine  15 Units Subcutaneous QHS  . lisinopril  40 mg Oral Daily  . pantoprazole  40 mg Oral Daily  . sodium chloride flush  3 mL Intravenous Q12H  . triamterene-hydrochlorothiazide  1 tablet Oral Daily   Continuous Infusions: . azithromycin    . cefTRIAXone (ROCEPHIN)  IV 2 g (05/31/20 0841)   PRN Meds:.acetaminophen **OR** acetaminophen  Results for orders placed or performed during the hospital encounter of 05/30/20 (from the past 48 hour(s))  CBC with Differential     Status: Abnormal   Collection Time: 05/30/20  3:51 PM  Result Value Ref Range   WBC 14.6 (H) 4.0 - 10.5 K/uL   RBC 4.77 4.22 - 5.81 MIL/uL   Hemoglobin 13.6 13.0 - 17.0 g/dL  HCT 42.8 39 - 52 %   MCV 89.7 80.0 - 100.0 fL   MCH 28.5 26.0 - 34.0 pg   MCHC 31.8 30.0 - 36.0 g/dL   RDW 13.2 11.5 - 15.5 %   Platelets 372 150 - 400 K/uL   nRBC 0.0 0.0 - 0.2 %   Neutrophils Relative % 78 %   Neutro Abs 11.6 (H) 1.7 - 7.7 K/uL   Lymphocytes Relative 11 %   Lymphs Abs 1.6 0.7 - 4.0 K/uL   Monocytes Relative 6 %   Monocytes Absolute 0.8 0 - 1 K/uL   Eosinophils Relative 1 %   Eosinophils Absolute 0.2 0 - 0 K/uL   Basophils Relative 1 %   Basophils Absolute 0.1 0 - 0 K/uL   Immature Granulocytes 3 %   Abs Immature Granulocytes 0.37 (H) 0.00 - 0.07 K/uL    Comment: Performed at [facility] [address],  [place], [place] [address]  Basic metabolic panel     Status: Abnormal   Collection Time: 05/30/20  3:51 PM  Result Value Ref Range   Sodium 138 135 - 145 mmol/L   Potassium 3.9 3.5 - 5.1 mmol/L   Chloride 97 (L) 98 - 111 mmol/L   CO2 27 22 - 32 mmol/L   Glucose, Bld 201 (H) 70 - 99 mg/dL    Comment: Glucose reference range applies only to samples taken after fasting for at least 8 hours.   BUN 21 8 - 23 mg/dL   Creatinine, Ser 1.09 0.61 - 1.24 mg/dL   Calcium 8.7 (L) 8.9 - 10.3 mg/dL   GFR calc non Af Amer >60 >60 mL/min   GFR calc Af Amer >60 >60 mL/min   Anion gap 14 5 - 15    Comment: Performed at [facility] [address], [place],  [address]  Respiratory Panel by RT PCR (Flu A&B, Covid) - Nasopharyngeal Swab     Status: None   Collection Time: 05/30/20  3:53 PM   Specimen: Nasopharyngeal Swab  Result Value Ref Range   SARS Coronavirus 2 by RT PCR NEGATIVE NEGATIVE    Comment: (NOTE) SARS-CoV-2 target nucleic acids are NOT DETECTED.  The SARS-CoV-2 RNA is generally detectable in upper respiratoy specimens during the acute phase of infection. The lowest concentration of SARS-CoV-2 viral copies this assay can detect is 131 copies/mL. A negative result does not preclude SARS-Cov-2 infection and should not be used as the sole basis for treatment or other patient management decisions. A negative result may occur with  improper specimen collection/handling, submission of specimen other than nasopharyngeal swab, presence of viral mutation(s) within the areas targeted by this assay, and inadequate number of viral copies (<131 copies/mL). A negative result must be combined with clinical observations, patient history, and epidemiological information. The expected result is Negative.  Fact Sheet for Patients:  PinkCheek.be  Fact Sheet for Healthcare Providers:  GravelBags.it  This test is no t yet approved or  cleared by the [place] FDA and  has been authorized for detection and/or diagnosis of SARS-CoV-2 by FDA under an Emergency Use Authorization (EUA). This EUA will remain  in effect (meaning this test can be used) for the duration of the COVID-19 declaration under Section 564(b)(1) of the Act, 21 U.S.C. section 360bbb-3(b)(1), unless the authorization is terminated or revoked sooner.     Influenza A by PCR NEGATIVE NEGATIVE   Influenza B by PCR NEGATIVE NEGATIVE    Comment: (NOTE)  The Xpert Xpress SARS-CoV-2/FLU/RSV assay is intended as an aid in  the diagnosis of influenza from Nasopharyngeal swab specimens and  should not be used as a sole basis for treatment. Nasal washings and  aspirates are unacceptable for Xpert Xpress SARS-CoV-2/FLU/RSV  testing.  Fact Sheet for Patients: PinkCheek.be  Fact Sheet for Healthcare Providers: GravelBags.it  This test is not yet approved or cleared by the [place] FDA and  has been authorized for detection and/or diagnosis of SARS-CoV-2 by  FDA under an Emergency Use Authorization (EUA). This EUA will remain  in effect (meaning this test can be used) for the duration of the  Covid-19 declaration under Section 564(b)(1) of the Act, 21  U.S.C. section 360bbb-3(b)(1), unless the authorization is  terminated or revoked. Performed at [facility], [place] [address] [place], [place] [address]   CBG monitoring, ED     Status: Abnormal   Collection Time: 05/30/20  9:56 PM  Result Value Ref Range   Glucose-Capillary 254 (H) 70 - 99 mg/dL    Comment: Glucose reference range applies only to samples taken after fasting for at least 8 hours.  Glucose, capillary     Status: Abnormal   Collection Time: 05/30/20 10:46 PM  Result Value Ref Range   Glucose-Capillary 218 (H) 70 - 99 mg/dL    Comment: Glucose reference range applies only to samples taken after fasting for at least 8 hours.   Basic metabolic panel     Status: Abnormal   Collection Time: 05/31/20  5:06 AM  Result Value Ref Range   Sodium 138 135 - 145 mmol/L   Potassium 3.4 (L) 3.5 - 5.1 mmol/L   Chloride 99 98 - 111 mmol/L   CO2 28 22 - 32 mmol/L   Glucose, Bld 91 70 - 99 mg/dL    Comment: Glucose reference range applies only to samples taken after fasting for at least 8 hours.   BUN 21 8 - 23 mg/dL   Creatinine, Ser 1.00 0.61 - 1.24 mg/dL   Calcium 8.5 (L) 8.9 - 10.3 mg/dL   GFR calc non Af Amer >60 >60 mL/min   GFR calc Af Amer >60 >60 mL/min   Anion gap 11 5 - 15    Comment: Performed at [facility] [address] [place], [place] [address]  CBC     Status: Abnormal   Collection Time: 05/31/20  5:06 AM  Result Value Ref Range   WBC 10.1 4.0 - 10.5 K/uL   RBC 4.40 4.22 - 5.81 MIL/uL   Hemoglobin 12.7 (L) 13.0 - 17.0 g/dL   HCT 39.5 39 - 52 %   MCV 89.8 80.0 - 100.0 fL   MCH 28.9 26.0 - 34.0 pg   MCHC 32.2 30.0 - 36.0 g/dL   RDW 13.2 11.5 - 15.5 %   Platelets 280 150 - 400 K/uL   nRBC 0.0 0.0 - 0.2 %    Comment: Performed at [facility], [place] [address], [place], [place] [address]  Hemoglobin A1c     Status: Abnormal   Collection Time: 05/31/20  5:06 AM  Result Value Ref Range   Hgb A1c MFr Bld 7.1 (H) 4.8 - 5.6 %    Comment: (NOTE) Pre diabetes:          5.7%-6.4%  Diabetes:              >6.4%  Glycemic control for   <7.0% adults with diabetes  Mean Plasma Glucose 157.07 mg/dL    Comment: Performed at [facility] [address] [place], [place] [address]  Glucose, capillary     Status: Abnormal   Collection Time: 05/31/20  8:12 AM  Result Value Ref Range   Glucose-Capillary 178 (H) 70 - 99 mg/dL    Comment: Glucose reference range applies only to samples taken after fasting for at least 8 hours.    CT Chest W Contrast  Result Date: 05/30/2020 CLINICAL DATA:  Pneumonia, effusion or abscess suspected, xray done EXAM: CT CHEST WITH CONTRAST TECHNIQUE:  Multidetector CT imaging of the chest was performed during intravenous contrast administration. CONTRAST:  81mL OMNIPAQUE IOHEXOL 300 MG/ML  SOLN COMPARISON:  Chest x-ray 05/30/2020. FINDINGS: Cardiovascular: Normal heart size. No significant pericardial effusion. 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 The thoracic aorta is normal in caliber. No atherosclerotic plaque of the thoracic aorta. Mild left anterior descending coronary artery calcifications. The main pulmonary artery is normal in caliber. No central or segmental pulmonary embolus. Mediastinum/Nodes: No enlarged mediastinal, hilar, or axillary lymph nodes. Thyroid gland, trachea, and esophagus demonstrate no significant findings. Lungs/Pleura: Incidentally noted azygos fissure. Bilateral lower lobe passive atelectasis that appears slightly hypodense and heterogeneous. Linear atelectasis versus scarring within the lungs. No definite focal consolidation. Calcified granuloma at the right base. Traction bronchiectasis within bilateral lower lobes. Mild bronchial wall thickening within bilateral lower lobes. No pulmonary mass within the aerated lungs. Moderate left pleural effusion that appears to be loculated at the apex (3:36. Trace to small volume right pleural effusion. No pneumothorax. Upper Abdomen: No acute abnormality. Musculoskeletal: No chest wall abnormality No suspicious lytic or blastic osseous lesions. Old healed sternal fracture. No acute displaced fracture. Multilevel degenerative changes of the spine. IMPRESSION: 1. Moderate loculated left pleural effusion. 2. Trace to small volume right pleural effusion. 3. Bilateral lower lobe passive atelectasis with underlying infection/inflammation not excluded. 4. 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 Aortic Atherosclerosis (ICD10-I70.0) - mild. Mild left anterior descending coronary artery  calcification. Electronically Signed   By: Iven Finn M.D.   On: 05/30/2020 19:12   DG Chest Portable 1 View  Result Date: 05/30/2020 CLINICAL DATA:  Shortness of breath for several days EXAM: PORTABLE CHEST 1 VIEW COMPARISON:  05/26/2020 FINDINGS: Cardiac shadow is stable. Aortic calcifications are again seen. Bilateral pleural effusions are noted left greater than right. The right is improved when compare with the prior exam although the left appears increased when compared prior exam underlying atelectasis/infiltrate is likely present. No bony abnormality is noted at this time. IMPRESSION: Bilateral effusions left greater than right. The left effusion has increased in the interval from the prior exam. Right effusion appears improved. Electronically Signed   By: Inez Catalina M.D.   On: 05/30/2020 16:46    Review of Systems  Constitutional: Positive for chills, fever and unexpected weight change.       Temp 102  HENT: Positive for dental problem, sore throat and trouble swallowing. Negative for congestion, ear discharge, mouth sores, nosebleeds, postnasal drip, sinus pressure and sinus pain.   Eyes: Positive for discharge and redness.  Respiratory: Positive for cough, choking, chest tightness, shortness of breath and wheezing.        +clear sputum  Cardiovascular: Positive for chest pain.  Gastrointestinal: Negative for constipation, diarrhea, nausea and vomiting.       Poor appetite, lost 15 lbs in  30 days  Genitourinary: Negative.   Musculoskeletal: Positive for myalgias, neck pain and neck stiffness.  Skin: Negative.   Allergic/Immunologic: Negative for environmental allergies and food allergies.  Neurological: Positive for weakness. Negative for dizziness, facial asymmetry, light-headedness and numbness.  Hematological: Negative for adenopathy. Bruises/bleeds easily.  Psychiatric/Behavioral: Negative for agitation and behavioral problems. The patient is nervous/anxious.    Blood  pressure 140/78, pulse 80, temperature 98 F (36.7 C), temperature source Oral, resp. rate 18, height 5\' 9"  (1.753 m), weight 80.1 kg, SpO2 98 %. Physical Exam Constitutional:      General: He is not in acute distress.    Appearance: He is well-developed. He is ill-appearing. He is not toxic-appearing or diaphoretic.  HENT:     Head: Normocephalic and atraumatic.     Mouth/Throat:     Mouth: Mucous membranes are moist.     Pharynx: Oropharynx is clear. No pharyngeal swelling or oropharyngeal exudate.  Eyes:     Extraocular Movements: Extraocular movements intact.     Pupils: Pupils are equal, round, and reactive to light.  Neck:     Thyroid: No thyromegaly.     Vascular: No JVD.     Trachea: No tracheal deviation.  Cardiovascular:     Rate and Rhythm: Normal rate and regular rhythm.  No extrasystoles are present.    Pulses: Normal pulses.     Heart sounds: No murmur heard.  No friction rub. No gallop.   Pulmonary:     Effort: Pulmonary effort is normal. No tachypnea or bradypnea.     Breath sounds: Decreased breath sounds and rhonchi present.  Chest:     Chest wall: No mass, deformity, tenderness or edema.  Abdominal:     General: Bowel sounds are normal.     Palpations: Abdomen is soft. There is no hepatomegaly or mass.     Tenderness: There is no guarding.     Comments: Minor LLQ TTP  Musculoskeletal:     Cervical back: Normal range of motion.     Right lower leg: No tenderness. No edema.  Lymphadenopathy:     Cervical: No cervical adenopathy.  Skin:    Capillary Refill: Capillary refill takes less than 2 seconds.     Coloration: Skin is not cyanotic or pale.     Findings: No ecchymosis, erythema or rash.     Nails: There is no clubbing.  Neurological:     General: No focal deficit present.     Mental Status: He is alert.  Psychiatric:        Mood and Affect: Mood normal.        Behavior: Behavior normal.     Assessment/Plan: Community-acquired pneumonia with  bilateral left greater than right pleural effusions.  Recommend placement of a left pigtail catheter by interventional radiology to obtain cultures and fluid studies.  John Giovanni PA-C 05/31/2020, 9:32 AM   Patient seen , history reviewed, CT scan reviewed this morning-we recommended proceeding with placement of a left chest which was done this afternoon with 700 mL of serous fluid drained-cultures and chemistry are pending.  Gross appearance looks like transudate.  Follow-up chest x-ray tomorrow and decide if the right side needs to be tapped also-small right effusion on CT scan Pleural fluid cultures are pending  I have seen and examined Eduardo Murray and agree with the above assessment  and plan.  Grace Isaac MD Beeper [phone] Office [phone] 05/31/2020 6:36 PM

## 2020-05-31 NOTE — Procedures (Signed)
Pre procedural Dx: Left sided pleural effusion Post procedural Dx: Same  Technically successful CT guided placed of a 14 Fr drainage catheter placement into the left pleural space yielding 700 cc of serous pleural fluid.    A representative aspirated sample was capped and sent to the laboratory for analysis.    EBL: None Complications: None immediate  Ronny Bacon, MD Pager #: [phone]

## 2020-05-31 NOTE — Progress Notes (Signed)
Initial Nutrition Assessment  DOCUMENTATION CODES:   Not applicable  INTERVENTION:  Once diet is advanced: Ensure Max po BID, each supplement provides 150 kcal and 30 grams of protein  MVI daily  NUTRITION DIAGNOSIS:   Increased nutrient needs related to acute illness (sepsis 2/2 CAP) as evidenced by estimated needs.    GOAL:   Patient will meet greater than or equal to 90% of their needs    MONITOR:   PO intake, Supplement acceptance, Weight trends, Labs, I & O's  REASON FOR ASSESSMENT:   Malnutrition Screening Tool    ASSESSMENT:   Pt admitted with sepsis 2/2 CAP with L-sided pleural effusion. PMH includes type 2 DM, HTN, and RBBB.   Pt unavailable at time of RD visit.   Per MD, pt is to have pigtail catheter placed.   Per wt readings, pt with a 7.8% wt loss in <1 month, which is significant for time frame. Suspect pt with some degree of malnutrition; however, unable to diagnose at this time without nutrition-focused physical exam. Will attempt at follow-up.  PO Intake: 75% 1 recorded meal   Labs: K+ 3.4 (L), CBGs 140-178 Medications: Novolog, Lantus, Protonix   Diet Order:   Diet Order            Diet NPO time specified Except for: Sips with Meds  Diet effective now                 EDUCATION NEEDS:   No education needs have been identified at this time  Skin:  Skin Assessment: Reviewed RN Assessment  Last BM:  10/4  Height:   Ht Readings from Last 1 Encounters:  05/31/20 5\' 9"  (1.753 m)    Weight:   Wt Readings from Last 1 Encounters:  05/31/20 80.1 kg    BMI:  Body mass index is 26.08 kg/m.  Estimated Nutritional Needs:   Kcal:  2100-2300  Protein:  105-115 grams  Fluid:  >2L/d    Larkin Ina, MS, RD, LDN RD pager number and weekend/on-call pager number located in [place].

## 2020-05-31 NOTE — Progress Notes (Signed)
PROGRESS NOTE  Eduardo [facility]:[id number] DOB: 08/17/1941 DOA: 05/30/2020 PCP: Deland Pretty, MD   LOS: 1 day   Brief Narrative / Interim history: 79 year old male with DM 2, hypertension, RBBB came into the hospital for shortness of breath, progressive over the last week.  He was seen in the ED on 9/30, was found to have bilateral pleural effusions, was given Lasix and sent home.  His shortness of breath persisted, got worse and said come back to the hospital.  On admission, there was a CT scan of the chest which showed moderate loculated left pleural effusion, his WBC was 14.6, he was placed on antibiotics and admitted to the hospital.  Subjective / 24h Interval events: States that he is doing well this morning.  His breathing is okay unless he tries to get up and move around.  He denies any chest congestion/fever and chills.  He does have a cough  Assessment & Plan: Principal Problem Sepsis due to community-acquired pneumonia with left-sided loculated pleural effusion, acute hypoxic respiratory failure -Patient was started on broad-spectrum antibiotics with ceftriaxone and azithromycin, continue.  Cultures are pending -Thoracic surgery consulted, recommending IR placing a pigtail catheter, discussed with IR we will sample the fluid this afternoon  Active Problems DM2 -Continue reduced Lantus, sliding scale, monitor CBGs  Essential hypertension -continue lisinopril  Pericardial cyst -Unclear significance, obtain a 2D echo, thoracic surgery consulted as above and appreciate insight  Scheduled Meds: . enoxaparin (LOVENOX) injection  40 mg Subcutaneous Q24H  . gabapentin  600 mg Oral QHS  . guaiFENesin  600 mg Oral BID  . insulin aspart  0-9 Units Subcutaneous TID WC  . insulin glargine  15 Units Subcutaneous QHS  . lisinopril  40 mg Oral Daily  . pantoprazole  40 mg Oral Daily  . sodium chloride flush  3 mL Intravenous Q12H  . triamterene-hydrochlorothiazide  1 tablet Oral  Daily   Continuous Infusions: . azithromycin    . cefTRIAXone (ROCEPHIN)  IV 2 g (05/31/20 0841)   PRN Meds:.acetaminophen **OR** acetaminophen  Diet Orders (From admission, onward)    Start     Ordered   05/31/20 0806  Diet NPO time specified Except for: Sips with Meds  Diet effective now       Question:  Except for  Answer:  Sips with Meds   05/31/20 0805          DVT prophylaxis: enoxaparin (LOVENOX) injection 40 mg Start: 05/30/20 2200     Code Status: Full Code  Family Communication: d/w patient   Status is: Inpatient  Remains inpatient appropriate because:Inpatient level of care appropriate due to severity of illness   Dispo: The patient is from: Home              Anticipated d/c is to: Home              Anticipated d/c date is: 3 days              Patient currently is not medically stable to d/c.  Consultants:  TCV IR  Procedures:  2D echo: pending  Microbiology  None   Antimicrobials: Ceftriaxone / Azithromycin 10/4 >>    Objective: Vitals:   05/30/20 2215 05/30/20 2246 05/31/20 0246 05/31/20 0914  BP:  (!) 145/80 140/78 140/78  Pulse: 95 86 80 80  Resp: (!) 22 18 18 18   Temp:  98 F (36.7 C) 98 F (36.7 C) 98 F (36.7 C)  TempSrc:   Oral Oral  SpO2: 94% 96% 98%   Weight:  80.1 kg  80.1 kg  Height:    5\' 9"  (1.753 m)    Intake/Output Summary (Last 24 hours) at 05/31/2020 1128 Last data filed at 05/31/2020 0811 Gross per 24 hour  Intake 240 ml  Output 0 ml  Net 240 ml   Filed Weights   05/30/20 2246 05/31/20 0914  Weight: 80.1 kg 80.1 kg    Examination:  Constitutional: NAD Eyes: no scleral icterus ENMT: Mucous membranes are moist.  Neck: normal, supple Respiratory: Diminished on the left but overall clear, no wheezing, no crackles, normal respiratory effort Cardiovascular: Regular rate and rhythm, no murmurs / rubs / gallops. Abdomen: non distended, no tenderness. Bowel sounds positive.  Musculoskeletal: no clubbing /  cyanosis.  Skin: no rashes Neurologic: CN 2-12 grossly intact. Strength 5/5 in all 4.    Data Reviewed: I have independently reviewed following labs and imaging studies   CBC: Recent Labs  Lab 05/26/20 1645 05/30/20 1551 05/31/20 0506  WBC 13.0* 14.6* 10.1  NEUTROABS 9.4* 11.6*  --   HGB 13.2 13.6 12.7*  HCT 39.1 42.8 39.5  MCV 88.3 89.7 89.8  PLT 404* 372 542   Basic Metabolic Panel: Recent Labs  Lab 05/26/20 1645 05/30/20 1551 05/31/20 0506  NA 133* 138 138  K 3.8 3.9 3.4*  CL 100 97* 99  CO2 19* 27 28  GLUCOSE 204* 201* 91  BUN 30* 21 21  CREATININE 1.30* 1.09 1.00  CALCIUM 8.6* 8.7* 8.5*   Liver Function Tests: Recent Labs  Lab 05/26/20 1645  AST 30  ALT 36  ALKPHOS 60  BILITOT 0.7  PROT 6.6  ALBUMIN 2.5*   Coagulation Profile: No results for input(s): INR, PROTIME in the last 168 hours. HbA1C: Recent Labs    05/31/20 0506  HGBA1C 7.1*   CBG: Recent Labs  Lab 05/30/20 2156 05/30/20 2246 05/31/20 0812  GLUCAP 254* 218* 178*    Recent Results (from the past 240 hour(s))  Respiratory Panel by RT PCR (Flu A&B, Covid) - Nasopharyngeal Swab     Status: None   Collection Time: 05/26/20 10:40 PM   Specimen: Nasopharyngeal Swab  Result Value Ref Range Status   SARS Coronavirus 2 by RT PCR NEGATIVE NEGATIVE Final    Comment: (NOTE) SARS-CoV-2 target nucleic acids are NOT DETECTED.  The SARS-CoV-2 RNA is generally detectable in upper respiratoy specimens during the acute phase of infection. The lowest concentration of SARS-CoV-2 viral copies this assay can detect is 131 copies/mL. A negative result does not preclude SARS-Cov-2 infection and should not be used as the sole basis for treatment or other patient management decisions. A negative result may occur with  improper specimen collection/handling, submission of specimen other than nasopharyngeal swab, presence of viral mutation(s) within the areas targeted by this assay, and inadequate  number of viral copies (<131 copies/mL). A negative result must be combined with clinical observations, patient history, and epidemiological information. The expected result is Negative.  Fact Sheet for Patients:  PinkCheek.be  Fact Sheet for Healthcare Providers:  GravelBags.it  This test is no t yet approved or cleared by the [place] FDA and  has been authorized for detection and/or diagnosis of SARS-CoV-2 by FDA under an Emergency Use Authorization (EUA). This EUA will remain  in effect (meaning this test can be used) for the duration of the COVID-19 declaration under Section 564(b)(1) of the Act, 21 U.S.C. section 360bbb-3(b)(1), unless the authorization is terminated or revoked sooner.  Influenza A by PCR NEGATIVE NEGATIVE Final   Influenza B by PCR NEGATIVE NEGATIVE Final    Comment: (NOTE) The Xpert Xpress SARS-CoV-2/FLU/RSV assay is intended as an aid in  the diagnosis of influenza from Nasopharyngeal swab specimens and  should not be used as a sole basis for treatment. Nasal washings and  aspirates are unacceptable for Xpert Xpress SARS-CoV-2/FLU/RSV  testing.  Fact Sheet for Patients: PinkCheek.be  Fact Sheet for Healthcare Providers: GravelBags.it  This test is not yet approved or cleared by the [place] FDA and  has been authorized for detection and/or diagnosis of SARS-CoV-2 by  FDA under an Emergency Use Authorization (EUA). This EUA will remain  in effect (meaning this test can be used) for the duration of the  Covid-19 declaration under Section 564(b)(1) of the Act, 21  U.S.C. section 360bbb-3(b)(1), unless the authorization is  terminated or revoked. Performed at [facility], [place] [address] [place], [place] [address]   Respiratory Panel by RT PCR (Flu A&B, Covid) - Nasopharyngeal Swab     Status: None   Collection Time:  05/30/20  3:53 PM   Specimen: Nasopharyngeal Swab  Result Value Ref Range Status   SARS Coronavirus 2 by RT PCR NEGATIVE NEGATIVE Final    Comment: (NOTE) SARS-CoV-2 target nucleic acids are NOT DETECTED.  The SARS-CoV-2 RNA is generally detectable in upper respiratoy specimens during the acute phase of infection. The lowest concentration of SARS-CoV-2 viral copies this assay can detect is 131 copies/mL. A negative result does not preclude SARS-Cov-2 infection and should not be used as the sole basis for treatment or other patient management decisions. A negative result may occur with  improper specimen collection/handling, submission of specimen other than nasopharyngeal swab, presence of viral mutation(s) within the areas targeted by this assay, and inadequate number of viral copies (<131 copies/mL). A negative result must be combined with clinical observations, patient history, and epidemiological information. The expected result is Negative.  Fact Sheet for Patients:  PinkCheek.be  Fact Sheet for Healthcare Providers:  GravelBags.it  This test is no t yet approved or cleared by the [place] FDA and  has been authorized for detection and/or diagnosis of SARS-CoV-2 by FDA under an Emergency Use Authorization (EUA). This EUA will remain  in effect (meaning this test can be used) for the duration of the COVID-19 declaration under Section 564(b)(1) of the Act, 21 U.S.C. section 360bbb-3(b)(1), unless the authorization is terminated or revoked sooner.     Influenza A by PCR NEGATIVE NEGATIVE Final   Influenza B by PCR NEGATIVE NEGATIVE Final    Comment: (NOTE) The Xpert Xpress SARS-CoV-2/FLU/RSV assay is intended as an aid in  the diagnosis of influenza from Nasopharyngeal swab specimens and  should not be used as a sole basis for treatment. Nasal washings and  aspirates are unacceptable for Xpert Xpress  SARS-CoV-2/FLU/RSV  testing.  Fact Sheet for Patients: PinkCheek.be  Fact Sheet for Healthcare Providers: GravelBags.it  This test is not yet approved or cleared by the [place] FDA and  has been authorized for detection and/or diagnosis of SARS-CoV-2 by  FDA under an Emergency Use Authorization (EUA). This EUA will remain  in effect (meaning this test can be used) for the duration of the  Covid-19 declaration under Section 564(b)(1) of the Act, 21  U.S.C. section 360bbb-3(b)(1), unless the authorization is  terminated or revoked. Performed at [facility], [place] [address] [place], [place] [address]  Radiology Studies: CT Chest W Contrast  Result Date: 05/30/2020 CLINICAL DATA:  Pneumonia, effusion or abscess suspected, xray done EXAM: CT CHEST WITH CONTRAST TECHNIQUE: Multidetector CT imaging of the chest was performed during intravenous contrast administration. CONTRAST:  40mL OMNIPAQUE IOHEXOL 300 MG/ML  SOLN COMPARISON:  Chest x-ray 05/30/2020. FINDINGS: Cardiovascular: Normal heart size. No significant pericardial effusion. 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 The thoracic aorta is normal in caliber. No atherosclerotic plaque of the thoracic aorta. Mild left anterior descending coronary artery calcifications. The main pulmonary artery is normal in caliber. No central or segmental pulmonary embolus. Mediastinum/Nodes: No enlarged mediastinal, hilar, or axillary lymph nodes. Thyroid gland, trachea, and esophagus demonstrate no significant findings. Lungs/Pleura: Incidentally noted azygos fissure. Bilateral lower lobe passive atelectasis that appears slightly hypodense and heterogeneous. Linear atelectasis versus scarring within the lungs. No definite focal consolidation. Calcified granuloma at the right base. Traction bronchiectasis within bilateral lower  lobes. Mild bronchial wall thickening within bilateral lower lobes. No pulmonary mass within the aerated lungs. Moderate left pleural effusion that appears to be loculated at the apex (3:36. Trace to small volume right pleural effusion. No pneumothorax. Upper Abdomen: No acute abnormality. Musculoskeletal: No chest wall abnormality No suspicious lytic or blastic osseous lesions. Old healed sternal fracture. No acute displaced fracture. Multilevel degenerative changes of the spine. IMPRESSION: 1. Moderate loculated left pleural effusion. 2. Trace to small volume right pleural effusion. 3. Bilateral lower lobe passive atelectasis with underlying infection/inflammation not excluded. 4. 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 Aortic Atherosclerosis (ICD10-I70.0) - mild. Mild left anterior descending coronary artery calcification. Electronically Signed   By: Iven Finn M.D.   On: 05/30/2020 19:12   DG Chest Portable 1 View  Result Date: 05/30/2020 CLINICAL DATA:  Shortness of breath for several days EXAM: PORTABLE CHEST 1 VIEW COMPARISON:  05/26/2020 FINDINGS: Cardiac shadow is stable. Aortic calcifications are again seen. Bilateral pleural effusions are noted left greater than right. The right is improved when compare with the prior exam although the left appears increased when compared prior exam underlying atelectasis/infiltrate is likely present. No bony abnormality is noted at this time. IMPRESSION: Bilateral effusions left greater than right. The left effusion has increased in the interval from the prior exam. Right effusion appears improved. Electronically Signed   By: Inez Catalina M.D.   On: 05/30/2020 16:46    Marzetta Board, MD, PhD [facility]  Between 7 am - 7 pm I am available, please contact me via Amion or Securechat  Between 7 pm - 7 am I am not available, please contact night coverage MD/APP via Amion

## 2020-05-31 NOTE — Consult Note (Signed)
Chief Complaint: Patient was seen in consultation today for  Chief Complaint  Patient presents with   Shortness of Breath    Referring Physician(s): Jadene Pierini, PA-C  Supervising Physician: Sandi Mariscal  Patient Status: [facility] - In-pt  History of Present Illness: Eduardo Murray is a 79 y.o. male with a medical history significant for DM2 and HTN. He has had worsening shortness of breath for the last two weeks. He presented to the ED on 05/26/20 and was found to have bilateral pleural effusions. He was given lasix and discharged home. He followed up with his PCP 05/30/20 and was sent to the ED from the doctor's office. Imaging was positive for left greater than right bilateral effusions. The patient had a productive cough and lab work revealed WBC of 14.6. COVID and Flu swabs were negative. The patient was admitted for sepsis due to community acquired pneumonia. Additional imaging was ordered.  CT chest 05/30/20: IMPRESSION: 1. Moderate loculated left pleural effusion. 2. Trace to small volume right pleural effusion. 3. Bilateral lower lobe passive atelectasis with underlying infection/inflammation not excluded. 4. 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 Aortic Atherosclerosis (ICD10-I70.0) - mild. Mild left anterior descending coronary artery calcification.  Interventional Radiology has been asked to evaluate this patient for an image-guided left chest tube placement.   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  Medications: Prior to Admission medications   Medication Sig Start Date End Date Taking? Authorizing Provider  acetaminophen (TYLENOL) 500 MG tablet Take 500 mg by mouth every 6 (six) hours as needed for fever.   Yes [provider]  aspirin EC 81 MG tablet Take 81 mg by mouth daily. Swallow whole.   Yes [provider]  baclofen (LIORESAL) 10 MG tablet Take 1 tablet (10 mg total) by mouth 3 (three) times daily. As needed for muscle spasm Patient taking differently: Take 10 mg by mouth daily as needed for muscle spasms.  03/24/19  Yes Marchia Bond, MD  Dulaglutide (TRULICITY) 1.5 GU/5.4YH SOPN Inject 1.5 mg into the skin every Sunday.   Yes [provider]  gabapentin (NEURONTIN) 300 MG capsule Take 600 mg by mouth at bedtime.   Yes [provider]  HYDROcodone-acetaminophen (NORCO) 10-325 MG tablet Take 1 tablet by mouth every 6 (six) hours as needed. Patient taking differently: Take 1 tablet by mouth every 6 (six) hours as needed for severe pain.  03/24/19  Yes Marchia Bond, MD  Insulin Glargine (TOUJEO MAX SOLOSTAR [place]) Inject 30 Units into the skin daily.    Yes [provider]  lisinopril (ZESTRIL) 40 MG tablet Take 40 mg by mouth daily.   Yes [provider]  metFORMIN (GLUCOPHAGE) 1000 MG tablet Take 1,000 mg by mouth 2 (two) times daily with a meal.   Yes [provider]  ondansetron (ZOFRAN) 4  MG tablet Take 1 tablet (4 mg total) by mouth every 8 (eight) hours as needed for nausea or vomiting. 03/24/19  Yes Marchia Bond, MD  pantoprazole (PROTONIX) 40 MG tablet Take 40 mg by mouth daily.  02/17/14  Yes [provider]  traZODone (DESYREL) 50 MG tablet Take 100 mg by mouth at bedtime.   Yes [provider]  triamterene-hydrochlorothiazide (MAXZIDE-25) 37.5-25 MG tablet Take 1  tablet by mouth daily. 05/01/20  Yes [provider]  vitamin B-12 (CYANOCOBALAMIN) 1000 MCG tablet Take 1,000 mcg by mouth daily.   Yes [provider]  aspirin EC 325 MG tablet Take 1 tablet (325 mg total) by mouth 2 (two) times daily. Patient not taking: Reported on 05/30/2020 03/24/19   Marchia Bond, MD  sennosides-docusate sodium (SENOKOT-S) 8.6-50 MG tablet Take 2 tablets by mouth daily. Patient not taking: Reported on 05/30/2020 03/24/19   Marchia Bond, MD     Family History  Problem Relation Age of Onset   Cancer Father    Diabetes Mother    Cancer Sister    Diabetes Sister    Diabetes Brother     Social History   Socioeconomic History   Marital status: Married    Spouse name: Not on file   Number of children: Not on file   Years of education: Not on file   Highest education level: Not on file  Occupational History   Not on file  Tobacco Use   Smoking status: Former Smoker    Packs/day: 1.00    Years: 30.00    Pack years: 30.00    Types: Cigarettes    Quit date: 12/02/1992    Years since quitting: 27.5   Smokeless tobacco: Former Systems developer    Types: Snuff    Quit date: 12/03/2007  Vaping Use   Vaping Use: Never used  Substance and Sexual Activity   Alcohol use: No   Drug use: No   Sexual activity: Not Currently  Other Topics Concern   Not on file  Social History Narrative   Not on file   Social Determinants of Health   Financial Resource Strain:    Difficulty of Paying Living Expenses: Not on file  Food Insecurity:    Worried About Charity fundraiser in the Last Year: Not on file   [place] in the Last Year: Not on file  Transportation Needs:    Lack of Transportation (Medical): Not on file   Lack of Transportation (Non-Medical): Not on file  Physical Activity:    Days of Exercise per Week: Not on file   Minutes of Exercise per Session: Not on file  Stress:    Feeling of Stress : Not on file  Social  Connections:    Frequency of Communication with Friends and Family: Not on file   Frequency of Social Gatherings with Friends and Family: Not on file   Attends Religious Services: Not on file   Active Member of Clubs or Organizations: Not on file   Attends Archivist Meetings: Not on file   Marital Status: Not on file    Review of Systems: A 12 point ROS discussed and pertinent positives are indicated in the HPI above.  All other systems are negative.  Review of Systems  Constitutional: Positive for fatigue. Negative for appetite change.  Respiratory: Positive for cough and shortness of breath.   Cardiovascular: Negative for chest pain and leg swelling.  Gastrointestinal: Positive for diarrhea. Negative for abdominal  pain, nausea and vomiting.  Musculoskeletal: Negative for back pain.  Neurological: Positive for light-headedness. Negative for headaches.    Vital Signs: BP 140/78    Pulse 80    Temp 98 F (36.7 C) (Oral)    Resp 18    Ht 5\' 9"  (1.753 m)    Wt 176 lb 9.4 oz (80.1 kg)    SpO2 98%    BMI 26.08 kg/m   Physical Exam Constitutional:      General: He is not in acute distress. HENT:     Mouth/Throat:     Mouth: Mucous membranes are moist.     Comments: Poor dental hygiene  Cardiovascular:     Rate and Rhythm: Normal rate and regular rhythm.     Pulses: Normal pulses.     Heart sounds: Normal heart sounds.  Pulmonary:     Breath sounds: Decreased air movement present. Examination of the right-upper field reveals decreased breath sounds and rhonchi. Examination of the left-upper field reveals decreased breath sounds and rhonchi. Examination of the right-lower field reveals decreased breath sounds. Examination of the left-lower field reveals decreased breath sounds. Decreased breath sounds and rhonchi present.     Comments: Productive cough Abdominal:     General: Bowel sounds are normal.     Palpations: Abdomen is soft.  Skin:    General: Skin is warm  and dry.  Neurological:     Mental Status: He is alert and oriented to person, place, and time.     Imaging: DG Chest 2 View  Result Date: 05/26/2020 CLINICAL DATA:  Shortness of breath. EXAM: CHEST - 2 VIEW COMPARISON:  November 24, 2012. FINDINGS: Stable cardiomediastinal silhouette. No pneumothorax is noted. Moderate bilateral pleural effusions are noted with associated atelectasis. Bony thorax is unremarkable. IMPRESSION: Moderate bilateral pleural effusions with associated atelectasis. Electronically Signed   By: Marijo Conception M.D.   On: 05/26/2020 17:18   CT Chest W Contrast  Result Date: 05/30/2020 CLINICAL DATA:  Pneumonia, effusion or abscess suspected, xray done EXAM: CT CHEST WITH CONTRAST TECHNIQUE: Multidetector CT imaging of the chest was performed during intravenous contrast administration. CONTRAST:  2mL OMNIPAQUE IOHEXOL 300 MG/ML  SOLN COMPARISON:  Chest x-ray 05/30/2020. FINDINGS: Cardiovascular: Normal heart size. No significant pericardial effusion. 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 The thoracic aorta is normal in caliber. No atherosclerotic plaque of the thoracic aorta. Mild left anterior descending coronary artery calcifications. The main pulmonary artery is normal in caliber. No central or segmental pulmonary embolus. Mediastinum/Nodes: No enlarged mediastinal, hilar, or axillary lymph nodes. Thyroid gland, trachea, and esophagus demonstrate no significant findings. Lungs/Pleura: Incidentally noted azygos fissure. Bilateral lower lobe passive atelectasis that appears slightly hypodense and heterogeneous. Linear atelectasis versus scarring within the lungs. No definite focal consolidation. Calcified granuloma at the right base. Traction bronchiectasis within bilateral lower lobes. Mild bronchial wall thickening within bilateral lower lobes. No pulmonary mass within the aerated lungs. Moderate left pleural effusion  that appears to be loculated at the apex (3:36. Trace to small volume right pleural effusion. No pneumothorax. Upper Abdomen: No acute abnormality. Musculoskeletal: No chest wall abnormality No suspicious lytic or blastic osseous lesions. Old healed sternal fracture. No acute displaced fracture. Multilevel degenerative changes of the spine. IMPRESSION: 1. Moderate loculated left pleural effusion. 2. Trace to small volume right pleural effusion. 3. Bilateral lower lobe passive atelectasis with underlying infection/inflammation not excluded. 4. 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 Aortic Atherosclerosis (ICD10-I70.0) - mild. Mild left anterior descending coronary artery calcification. Electronically Signed   By: Iven Finn M.D.   On: 05/30/2020 19:12   DG Chest Portable 1 View  Result Date: 05/30/2020 CLINICAL DATA:  Shortness of breath for several days EXAM: PORTABLE CHEST 1 VIEW COMPARISON:  05/26/2020 FINDINGS: Cardiac shadow is stable. Aortic calcifications are again seen. Bilateral pleural effusions are noted left greater than right. The right is improved when compare with the prior exam although the left appears increased when compared prior exam underlying atelectasis/infiltrate is likely present. No bony abnormality is noted at this time. IMPRESSION: Bilateral effusions left greater than right. The left effusion has increased in the interval from the prior exam. Right effusion appears improved. Electronically Signed   By: Inez Catalina M.D.   On: 05/30/2020 16:46   ECHOCARDIOGRAM COMPLETE  Result Date: 05/31/2020    ECHOCARDIOGRAM REPORT   Patient Name:   Eduardo Murray Date of Exam: 05/31/2020 Medical Rec #:  [id number]        Height:       69.0 in Accession #:    [id number]       Weight:       176.6 lb Date of Birth:  [birth year]        BSA:          1.960 m Patient Age:    44 years         BP:           140/78 mmHg Patient Gender: M                 HR:           84 bpm. Exam Location:  Inpatient Procedure: 2D Echo, Color Doppler and Cardiac Doppler Indications:    R06.9 DOE  History:        Patient has no prior history of Echocardiogram examinations.                 Risk Factors:Hypertension and Diabetes.  Sonographer:    Raquel Sarna Senior RDCS Referring Phys: [id number] [place]  Sonographer Comments: Technically difficult study due to poor echo windows, possibly lung interference IMPRESSIONS  1. Left ventricular ejection fraction, by estimation, is 60 to 65%. The left ventricle has normal function. The left ventricle has no regional wall motion abnormalities. There is moderate asymmetric left ventricular hypertrophy of the basal-septal segment. Left ventricular diastolic parameters are consistent with Grade I diastolic dysfunction (impaired relaxation).  2. Right ventricular systolic function is normal. The right ventricular size is normal.  3. The mitral valve is normal in structure. No evidence of mitral valve regurgitation. No evidence of mitral stenosis.  4. The aortic valve is normal in structure. Aortic valve regurgitation is not visualized. No aortic stenosis is present.  5. The inferior vena cava is normal in size with greater than 50% respiratory variability, suggesting right atrial pressure of 3 mmHg. FINDINGS  Left Ventricle: Left ventricular ejection fraction, by estimation, is 60 to 65%. The left ventricle has normal function. The left ventricle has no regional wall motion abnormalities. The left ventricular internal cavity size was normal in size. There is  moderate asymmetric left ventricular hypertrophy of the basal-septal segment. Left ventricular diastolic parameters are consistent with Grade I diastolic dysfunction (impaired relaxation). Indeterminate filling pressures. Right Ventricle: The right ventricular size is normal. No increase in right ventricular wall thickness. Right ventricular systolic function is normal.  Left Atrium: Left  atrial size was normal in size. Right Atrium: Right atrial size was normal in size. Pericardium: There is no evidence of pericardial effusion. Mitral Valve: The mitral valve is normal in structure. No evidence of mitral valve regurgitation. No evidence of mitral valve stenosis. Tricuspid Valve: The tricuspid valve is normal in structure. Tricuspid valve regurgitation is not demonstrated. No evidence of tricuspid stenosis. Aortic Valve: The aortic valve is normal in structure. Aortic valve regurgitation is not visualized. No aortic stenosis is present. Pulmonic Valve: The pulmonic valve was normal in structure. Pulmonic valve regurgitation is not visualized. No evidence of pulmonic stenosis. Aorta: The aortic root is normal in size and structure. Venous: The inferior vena cava is normal in size with greater than 50% respiratory variability, suggesting right atrial pressure of 3 mmHg. IAS/Shunts: No atrial level shunt detected by color flow Doppler.  LEFT VENTRICLE PLAX 2D LVIDd:         3.80 cm  Diastology LVIDs:         2.60 cm  LV e' medial:    5.11 cm/s LV PW:         1.00 cm  LV E/e' medial:  12.9 LV IVS:        1.40 cm  LV e' lateral:   5.33 cm/s LVOT diam:     2.10 cm  LV E/e' lateral: 12.4 LV SV:         54 LV SV Index:   27 LVOT Area:     3.46 cm  RIGHT VENTRICLE RV S prime:     7.51 cm/s TAPSE (M-mode): 1.7 cm LEFT ATRIUM             Index       RIGHT ATRIUM           Index LA diam:        2.90 cm 1.48 cm/m  RA Area:     14.40 cm LA Vol (A2C):   33.2 ml 16.94 ml/m RA Volume:   32.60 ml  16.64 ml/m LA Vol (A4C):   23.5 ml 11.99 ml/m LA Biplane Vol: 27.6 ml 14.09 ml/m  AORTIC VALVE LVOT Vmax:   89.50 cm/s LVOT Vmean:  65.300 cm/s LVOT VTI:    0.155 m  AORTA Ao Root diam: 3.40 cm MITRAL VALVE MV Area (PHT): 1.62 cm    SHUNTS MV Decel Time: 467 msec    Systemic VTI:  0.16 m MV E velocity: 66.10 cm/s  Systemic Diam: 2.10 cm MV A velocity: 98.50 cm/s MV E/A ratio:  0.67 Skeet Latch MD Electronically  signed by Skeet Latch MD Signature Date/Time: 05/31/2020/11:29:55 AM    Final     Labs:  CBC: Recent Labs    05/26/20 1645 05/30/20 1551 05/31/20 0506  WBC 13.0* 14.6* 10.1  HGB 13.2 13.6 12.7*  HCT 39.1 42.8 39.5  PLT 404* 372 280    COAGS: No results for input(s): INR, APTT in the last 8760 hours.  BMP: Recent Labs    05/26/20 1645 05/30/20 1551 05/31/20 0506  NA 133* 138 138  K 3.8 3.9 3.4*  CL 100 97* 99  CO2 19* 27 28  GLUCOSE 204* 201* 91  BUN 30* 21 21  CALCIUM 8.6* 8.7* 8.5*  CREATININE 1.30* 1.09 1.00  GFRNONAA 52* >60 >60  GFRAA >60 >60 >60    LIVER FUNCTION TESTS: Recent Labs    05/26/20 1645  BILITOT 0.7  AST 30  ALT 36  ALKPHOS 60  PROT 6.6  ALBUMIN 2.5*    TUMOR MARKERS: No results for input(s): AFPTM, CEA, CA199, CHROMGRNA in the last 8760 hours.  Assessment and Plan:  Sepsis due to community-acquired pneumonia with left-sided loculated pleural effusion: Eduardo Murray, 79 year old male, is tentatively scheduled to be seen at the [facility] department 05/31/20 for a left chest tube. The patient's wife was present at the bedside.   Risks and benefits discussed with the patient and his wife including bleeding, infection, damage to adjacent structures, and sepsis.  All of the patient's questions were answered, patient and his wife are agreeable to proceed.  The patient has been NPO since 0800. Labs and vitals have been reviewed. The patient last received a dose of lovenox 05/30/20 at 2218.   The patient is currently on 74M which is a renal unit. This unit does not take new chest tube patients and the patient will need to be transferred to a unit that can manage a patient with a new chest tube. Dr. Cruzita Lederer and Jadene Pierini, PA are aware and an order for transfer has been placed. IR will proceed with chest tube placement when patient has a new bed available on a different unit.   Consent signed and in  chart.  Thank you for this interesting consult.  I greatly enjoyed meeting Eduardo Murray and look forward to participating in their care.  A copy of this report was sent to the requesting provider on this date.  Electronically Signed: Soyla Dryer, AGACNP-BC [phone] 05/31/2020, 12:26 PM   I spent a total of 40 Minutes    in face to face in clinical consultation, greater than 50% of which was counseling/coordinating care for left chest tube placement.

## 2020-06-01 ENCOUNTER — Inpatient Hospital Stay (HOSPITAL_COMMUNITY): Payer: Medicare HMO

## 2020-06-01 DIAGNOSIS — E119 Type 2 diabetes mellitus without complications: Secondary | ICD-10-CM

## 2020-06-01 DIAGNOSIS — J90 Pleural effusion, not elsewhere classified: Secondary | ICD-10-CM

## 2020-06-01 LAB — CBC
HCT: 38.7 % — ABNORMAL LOW (ref 39.0–52.0)
Hemoglobin: 12.3 g/dL — ABNORMAL LOW (ref 13.0–17.0)
MCH: 28.7 pg (ref 26.0–34.0)
MCHC: 31.8 g/dL (ref 30.0–36.0)
MCV: 90.4 fL (ref 80.0–100.0)
Platelets: 329 10*3/uL (ref 150–400)
RBC: 4.28 MIL/uL (ref 4.22–5.81)
RDW: 13.2 % (ref 11.5–15.5)
WBC: 10.8 10*3/uL — ABNORMAL HIGH (ref 4.0–10.5)
nRBC: 0 % (ref 0.0–0.2)

## 2020-06-01 LAB — GLUCOSE, CAPILLARY
Glucose-Capillary: 175 mg/dL — ABNORMAL HIGH (ref 70–99)
Glucose-Capillary: 309 mg/dL — ABNORMAL HIGH (ref 70–99)
Glucose-Capillary: 220 mg/dL — ABNORMAL HIGH (ref 70–99)
Glucose-Capillary: 302 mg/dL — ABNORMAL HIGH (ref 70–99)

## 2020-06-01 LAB — COMPREHENSIVE METABOLIC PANEL
ALT: 21 U/L (ref 0–44)
AST: 16 U/L (ref 15–41)
Albumin: 2.2 g/dL — ABNORMAL LOW (ref 3.5–5.0)
Alkaline Phosphatase: 47 U/L (ref 38–126)
Anion gap: 10 (ref 5–15)
BUN: 29 mg/dL — ABNORMAL HIGH (ref 8–23)
CO2: 28 mmol/L (ref 22–32)
Calcium: 8.6 mg/dL — ABNORMAL LOW (ref 8.9–10.3)
Chloride: 95 mmol/L — ABNORMAL LOW (ref 98–111)
Creatinine, Ser: 1.16 mg/dL (ref 0.61–1.24)
GFR calc non Af Amer: 60 mL/min — ABNORMAL LOW (ref >60)
Glucose, Bld: 219 mg/dL — ABNORMAL HIGH (ref 70–99)
Potassium: 3.9 mmol/L (ref 3.5–5.1)
Sodium: 133 mmol/L — ABNORMAL LOW (ref 135–145)
Total Bilirubin: 0.7 mg/dL (ref 0.3–1.2)
Total Protein: 6.1 g/dL — ABNORMAL LOW (ref 6.5–8.1)

## 2020-06-01 LAB — MAGNESIUM: Magnesium: 1.5 mg/dL — ABNORMAL LOW (ref 1.7–2.4)

## 2020-06-01 MED ORDER — GUAIFENESIN ER 600 MG PO TB12
1200.0000 mg | ORAL_TABLET | Freq: Two times a day (BID) | ORAL | Status: DC
  Administered 2020-06-01 – 2020-06-04 (×6): 1200 mg via ORAL
  Filled 2020-06-01 (×6): qty 2

## 2020-06-01 MED ORDER — MAGNESIUM SULFATE 2 GM/50ML IV SOLN
2.0000 g | Freq: Once | INTRAVENOUS | Status: AC
  Administered 2020-06-01: 2 g via INTRAVENOUS
  Filled 2020-06-01: qty 50

## 2020-06-01 MED ORDER — AZITHROMYCIN 250 MG PO TABS
500.0000 mg | ORAL_TABLET | Freq: Every day | ORAL | Status: AC
  Administered 2020-06-01 – 2020-06-03 (×3): 500 mg via ORAL
  Filled 2020-06-01 (×3): qty 2

## 2020-06-01 NOTE — Progress Notes (Signed)
PROGRESS NOTE    Eduardo Murray  MWN:[id number] DOB: 10/13/1940 DOA: 05/30/2020 PCP: Deland Pretty, MD   Brief Narrative:  HPI per Dr. Zada Finders on 05/30/20 Eduardo Murray is a 79 y.o. male with medical history significant for type 2 diabetes, hypertension, and RBBB who presents to the ED for evaluation of shortness of breath.  Patient states he has been having 1 week of new shortness of breath.  He has had associated cough productive of clear sputum.  He says he has been having chills, sweats, and rigors.  Patient was initially seen in the ED on 05/26/2020 and found to have bilateral pleural effusions on CXR.  He was otherwise felt to be stable and discharged to home with new prescription of oral Lasix.  Patient states he has been taking Lasix without significant improvement.  He was seen by his PCP earlier today who arranged for patient to return to the ED for further evaluation.  ED Course:  Initial vitals showed BP 157/89, pulse 108, RR 19, temp 98.0 Fahrenheit, SPO2 initially 90% on room air.  Patient placed on 2 L supplemental O2 via [place] with subsequent SPO2 >96%.  Labs show WBC 14.6, hemoglobin 13.6, platelets 372,000, sodium 138, potassium 3.9, bicarb 27, BUN 21, creatinine 1.09, serum glucose 201.  SARS-CoV-2 PCR is negative.  Influenza A/B PCR negative.  Portable chest x-ray showed bilateral effusions left greater than right with increased in left effusion size.  CT chest with contrast showed moderate loculated left pleural effusion with trace to small volume right pleural effusion.  Bilateral lower lobe passive atelectasis also seen.  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  Patient was started on IV ceftriaxone and azithromycin and the hospitalist service was consulted today for further evaluation and management.  **Interim History Patient underwent a Left sided Chest Tube Placement on 05/31/20 and had 700 mL Fluid out. Cx's are still pending. Feels as if respiratory  status is improving but continues to have a significant cough.   Assessment & Plan:   Principal Problem:   Sepsis due to pneumonia [facility]) Active Problems:   Type 2 diabetes mellitus without complications (HCC)   Loculated pleural effusion   Hypertension associated with diabetes ([place])  Sepsis due to Community-acquired pneumonia with left-sided loculated pleural effusion, Acute Hypoxic Respiratory Failure -On Admission Patient presented with leukocytosis 14.6, tachycardia up to 112, and tachypnea with RR up to 30.  CT shows left-sided loculated effusion suspicious for pneumonia.  SPO2 90% on room air, improved on 2 L O2 via [place].  Covid is negative.  He has been started on empiric antibiotics. -Patient was started on broad-spectrum antibiotics with ceftriaxone and azithromycin and will continue.   -Cultures are pending but Drain Cx showed NGTD <24 Hours; Blood Cx x2 showed NGTD at 1 Day -C/w Guaifenesin and will change from 600 mg po BID to 1200 mg po BID -Check Legionella Pneumophila Serogp 1 Ur Ag and Strep Pneumoniae Urinary Ag -Thoracic surgery consulted and they recommending IR placing a pigtail catheter, discussed with IR and Chest Tube was placed yesterday and revealed 700 mL of Serous Fluid.  -Thoracic Surgery Does not believe this is an Empyema -SpO2: 94 % O2 Flow Rate (L/min): 3 L/min -Repeat CXR today showed "Interval decrease in basilar left pleural effusion status post chest tube placement. No pleural gas visible. Otherwise stable exam."; Right pleural effusion is stable and small  DM2 with associated Neuropathy -Continue reduced Lantus at 15 units sq qDaily and Sensitive  Novolog SSI AC -Continue to hold Metformin -HbA1c was 7.1 -C/w Gabapentin 600 mg po qHS -Continue to Monitor Blood Sugars Carefully; CBG's ranging from 135-309  Essential Hypertension -Continue Lisinopril 40 mg po daily and Triamterene-HCTZ 37.5-25 mg po 1 tab Daily  -Last BP was 109/61 -Continue to  Monitor BP per Protocol   Chronic Diastolic Grade 1 Diastolic Dysfunction -ECHOCardiogram showed "Left ventricular diastolic parameters are consistent with Grade I diastolic dysfunction (impaired relaxation)." -Currently appears Euvolemic -Continue to Monitor S/Sx of Volume Overload -Repeat CXR in the AM    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 K+ went from 3.4 -> 3.8 -Continue to Monitor and Replete as Necessary -Repeat CMP in the AM  Hypomagnesemia -Patient's Mag Level this AM was 1.5 -Replete with IV Mag Sulfate 2 grams -Continue to Monitor and Replete as Necessary -Repeat Mag Level in the AM   Leukocytosis -Patient's WBC went from 10.1 -> 10.8; In the setting of Above -Continue to Monitor and Trend -Repeat CBC in the AM   DVT prophylaxis: Enoxaparin 40 mg sq q24h Code Status: FULL CODE Family Communication: Discussed with Wife at bedside  Disposition Plan: Pending further clinical improvement and clearance by IR and Thoracic Surgery  Status is: Inpatient  Remains inpatient appropriate because:Unsafe d/c plan, IV treatments appropriate due to intensity of illness or inability to take PO and Inpatient level of care appropriate due to severity of illness   Dispo: The patient is from: Home              Anticipated d/c is to: TBD              Anticipated d/c date is: 2 days               Patient currently is not medically stable to d/c.  Consultants:   Cardiothoracic Surgery Dr. Lanelle Bal  IR Dr. Sandi Mariscal   Procedures:  Technically successful CT guided placed of a 14 Fr drainage catheter placement into the left pleural space yielding 700 cc of serous pleural fluid done by Dr. Sandi Mariscal.    ECHOCARDIOGRAM IMPRESSIONS    1. Left ventricular ejection fraction, by estimation, is 60 to 65%. The  left ventricle has normal function. The left ventricle has no regional  wall motion abnormalities. There is moderate asymmetric left ventricular  hypertrophy of the basal-septal  segment. Left ventricular diastolic parameters are consistent with Grade I  diastolic dysfunction (impaired relaxation).  2. Right ventricular systolic function is normal. The right ventricular  size is normal.  3. The mitral valve is normal in structure. No evidence of mitral valve  regurgitation. No evidence of mitral stenosis.  4. The aortic valve is normal in structure. Aortic valve regurgitation is  not visualized. No aortic stenosis is present.  5. The inferior vena cava is normal in size with greater than 50%  respiratory variability, suggesting right atrial pressure of 3 mmHg.   FINDINGS  Left Ventricle: Left ventricular ejection fraction, by estimation, is 60  to 65%. The left ventricle has normal function. The left ventricle has no  regional wall motion abnormalities. The left ventricular internal cavity  size was normal in size. There is  moderate asymmetric left ventricular hypertrophy of the basal-septal  segment. Left ventricular diastolic parameters are consistent with Grade I  diastolic dysfunction (impaired relaxation). Indeterminate filling  pressures.   Right Ventricle: The right ventricular size is normal. No increase in  right ventricular wall thickness. Right ventricular systolic function is  normal.   Left Atrium: Left atrial size was normal in size.    Right Atrium: Right atrial size was normal in size.   Pericardium: There is no evidence of pericardial effusion.   Mitral Valve: The mitral valve is normal in structure. No evidence of  mitral valve regurgitation. No evidence of mitral valve stenosis.   Tricuspid Valve: The tricuspid valve is normal in structure. Tricuspid  valve regurgitation is not demonstrated. No evidence of tricuspid  stenosis.   Aortic Valve: The aortic valve is normal in structure. Aortic valve  regurgitation is not visualized. No aortic stenosis is present.   Pulmonic Valve: The pulmonic valve was normal in structure. Pulmonic valve  regurgitation is not visualized. No evidence of pulmonic stenosis.   Aorta: The aortic root is normal in size and structure.   Venous: The inferior vena cava is normal in size with greater than 50%  respiratory variability, suggesting right atrial pressure of 3 mmHg.   IAS/Shunts: No atrial level shunt detected by color flow Doppler.     LEFT VENTRICLE  PLAX 2D  LVIDd:     3.80 cm Diastology  LVIDs:     2.60 cm LV e' medial:  5.11 cm/s  LV PW:     1.00 cm LV E/e' medial: 12.9  LV IVS:    1.40 cm LV e' lateral:  5.33 cm/s  LVOT diam:   2.10 cm LV E/e' lateral: 12.4  LV SV:     54  LV SV Index:  27  LVOT Area:   3.46 cm     RIGHT VENTRICLE  RV S prime:   7.51 cm/s  TAPSE (M-mode): 1.7 cm   LEFT ATRIUM       Index    RIGHT ATRIUM      Index  LA diam:    2.90 cm 1.48 cm/m RA Area:   14.40 cm  LA Vol (A2C):  33.2 ml 16.94 ml/m RA Volume:  32.60 ml 16.64 ml/m  LA Vol (A4C):  23.5 ml 11.99 ml/m  LA Biplane Vol: 27.6 ml 14.09 ml/m  AORTIC VALVE  LVOT Vmax:  89.50 cm/s  LVOT Vmean: 65.300 cm/s  LVOT VTI:  0.155 m    AORTA  Ao Root diam: 3.40 cm   MITRAL VALVE  MV Area (PHT): 1.62 cm  SHUNTS  MV Decel Time: 467 msec  Systemic VTI: 0.16 m  MV E velocity: 66.10 cm/s Systemic Diam:  2.10 cm  MV A velocity: 98.50 cm/s  MV E/A ratio: 0.67   Antimicrobials:  Anti-infectives (From admission, onward)   Start     Dose/Rate Route Frequency Ordered Stop   05/31/20 2000  azithromycin (ZITHROMAX) 500 mg in sodium chloride 0.9 % 250 mL IVPB        500 mg 250 mL/hr over 60 Minutes Intravenous Every 24 hours 05/30/20 2126 06/04/20 1959   05/31/20 0800  cefTRIAXone (ROCEPHIN) 2 g in sodium chloride 0.9 %  100 mL IVPB        2 g 200 mL/hr over 30 Minutes Intravenous Every 24 hours 05/30/20 2126 06/05/20 0759   05/30/20 2000  cefTRIAXone (ROCEPHIN) 1 g in sodium chloride 0.9 % 100 mL IVPB        1 g 200 mL/hr over 30 Minutes Intravenous  Once 05/30/20 1949 05/30/20 2039   05/30/20 2000  azithromycin (ZITHROMAX) tablet 500 mg        500 mg Oral  Once 05/30/20 1949 05/30/20 2002       Subjective: Seen and examined at bedside and thinks his respiratory status is doing little bit better.  He had removed his supplemental oxygen this morning.  Continues to have a nonproductive significant cough.  Denies any chest pain or lightheadedness or dizziness. No other concerns or complaints at this time.   Objective: Vitals:   06/01/20 0025 06/01/20 0547 06/01/20 0647 06/01/20 0738  BP: 116/70   109/61  Pulse: 94   81  Resp: 18   16  Temp: 97.7 F (36.5 C)   98.1 F (36.7 C)  TempSrc: Oral     SpO2: 97% 93%  94%  Weight:  80.3 kg 82.1 kg   Height:        Intake/Output Summary (Last 24 hours) at 06/01/2020 0747 Last data filed at 06/01/2020 4967 Gross per 24 hour  Intake 340 ml  Output 1400 ml  Net -1060 ml   Filed Weights   05/31/20 0914 06/01/20 0547 06/01/20 0647  Weight: 80.1 kg 80.3 kg 82.1 kg   Examination: Physical Exam:  Constitutional: WN/WD slightly overweight Caucasian male in NAD and appears calm but mildly uncomfortable coughing  Eyes: Lids and conjunctivae normal, sclerae anicteric  ENMT: External Ears, Nose appear normal. Grossly normal hearing.  Neck:  Appears normal, supple, no cervical masses, normal ROM, no appreciable thyromegaly; no JVD Respiratory: Diminished to auscultation bilaterally with coarse breath sounds and has some crackles and rhonchi. Normal respiratory effort and patient is not tachypenic and has removed his NS. No accessory muscle use. Has a significant Non-productive Cough. Has a Left sided Chest Tube  Cardiovascular: RRR, no murmurs / rubs / gallops. S1 and S2 auscultated. No extremity edema. Abdomen: Soft, non-tender, mildly distended. Bowel sounds positive.  GU: Deferred. Musculoskeletal: No clubbing / cyanosis of digits/nails. No joint deformity upper and lower extremities.  Skin: No rashes, lesions, ulcers on a limited skin evaluation. No induration; Warm and dry.  Neurologic: CN 2-12 grossly intact with no focal deficits. Romberg sign and cerebellar reflexes not assessed.  Psychiatric: Normal judgment and insight. Alert and oriented x 3. Normal mood and appropriate affect.   Data Reviewed: I have personally reviewed following labs and imaging studies  CBC: Recent Labs  Lab 05/26/20 1645 05/30/20 1551 05/31/20 0506 06/01/20 0240  WBC 13.0* 14.6* 10.1 10.8*  NEUTROABS 9.4* 11.6*  --   --   HGB 13.2 13.6 12.7* 12.3*  HCT 39.1 42.8 39.5 38.7*  MCV 88.3 89.7 89.8 90.4  PLT 404* 372 280 591   Basic Metabolic Panel: Recent Labs  Lab 05/26/20 1645 05/30/20 1551 05/31/20 0506 06/01/20 0240  NA 133* 138 138 133*  K 3.8 3.9 3.4* 3.9  CL 100 97* 99 95*  CO2 19* 27 28 28   GLUCOSE 204* 201* 91 219*  BUN 30* 21 21 29*  CREATININE 1.30* 1.09 1.00 1.16  CALCIUM 8.6* 8.7* 8.5* 8.6*  MG  --   --   --  1.5*  GFR: Estimated Creatinine Clearance: 52.5 mL/min (by C-G formula based on SCr of 1.16 mg/dL). Liver Function Tests: Recent Labs  Lab 05/26/20 1645 06/01/20 0240  AST 30 16  ALT 36 21  ALKPHOS 60 47  BILITOT 0.7 0.7  PROT 6.6 6.1*  ALBUMIN 2.5* 2.2*   No results for input(s): LIPASE, AMYLASE in  the last 168 hours. No results for input(s): AMMONIA in the last 168 hours. Coagulation Profile: No results for input(s): INR, PROTIME in the last 168 hours. Cardiac Enzymes: No results for input(s): CKTOTAL, CKMB, CKMBINDEX, TROPONINI in the last 168 hours. BNP (last 3 results) No results for input(s): PROBNP in the last 8760 hours. HbA1C: Recent Labs    05/31/20 0506  HGBA1C 7.1*   CBG: Recent Labs  Lab 05/30/20 2246 05/31/20 0812 05/31/20 1137 05/31/20 1753 05/31/20 2235  GLUCAP 218* 178* 140* 135* 326*   Lipid Profile: No results for input(s): CHOL, HDL, LDLCALC, TRIG, CHOLHDL, LDLDIRECT in the last 72 hours. Thyroid Function Tests: No results for input(s): TSH, T4TOTAL, FREET4, T3FREE, THYROIDAB in the last 72 hours. Anemia Panel: No results for input(s): VITAMINB12, FOLATE, FERRITIN, TIBC, IRON, RETICCTPCT in the last 72 hours. Sepsis Labs: No results for input(s): PROCALCITON, LATICACIDVEN in the last 168 hours.  Recent Results (from the past 240 hour(s))  Respiratory Panel by RT PCR (Flu A&B, Covid) - Nasopharyngeal Swab     Status: None   Collection Time: 05/26/20 10:40 PM   Specimen: Nasopharyngeal Swab  Result Value Ref Range Status   SARS Coronavirus 2 by RT PCR NEGATIVE NEGATIVE Final    Comment: (NOTE) SARS-CoV-2 target nucleic acids are NOT DETECTED.  The SARS-CoV-2 RNA is generally detectable in upper respiratoy specimens during the acute phase of infection. The lowest concentration of SARS-CoV-2 viral copies this assay can detect is 131 copies/mL. A negative result does not preclude SARS-Cov-2 infection and should not be used as the sole basis for treatment or other patient management decisions. A negative result may occur with  improper specimen collection/handling, submission of specimen other than nasopharyngeal swab, presence of viral mutation(s) within the areas targeted by this assay, and inadequate number of viral copies (<131 copies/mL). A  negative result must be combined with clinical observations, patient history, and epidemiological information. The expected result is Negative.  Fact Sheet for Patients:  PinkCheek.be  Fact Sheet for Healthcare Providers:  GravelBags.it  This test is no t yet approved or cleared by the [place] FDA and  has been authorized for detection and/or diagnosis of SARS-CoV-2 by FDA under an Emergency Use Authorization (EUA). This EUA will remain  in effect (meaning this test can be used) for the duration of the COVID-19 declaration under Section 564(b)(1) of the Act, 21 U.S.C. section 360bbb-3(b)(1), unless the authorization is terminated or revoked sooner.     Influenza A by PCR NEGATIVE NEGATIVE Final   Influenza B by PCR NEGATIVE NEGATIVE Final    Comment: (NOTE) The Xpert Xpress SARS-CoV-2/FLU/RSV assay is intended as an aid in  the diagnosis of influenza from Nasopharyngeal swab specimens and  should not be used as a sole basis for treatment. Nasal washings and  aspirates are unacceptable for Xpert Xpress SARS-CoV-2/FLU/RSV  testing.  Fact Sheet for Patients: PinkCheek.be  Fact Sheet for Healthcare Providers: GravelBags.it  This test is not yet approved or cleared by the [place] FDA and  has been authorized for detection and/or diagnosis of SARS-CoV-2 by  FDA under an Emergency Use Authorization (EUA). This  EUA will remain  in effect (meaning this test can be used) for the duration of the  Covid-19 declaration under Section 564(b)(1) of the Act, 21  U.S.C. section 360bbb-3(b)(1), unless the authorization is  terminated or revoked. Performed at [facility], [place] [address], [place], [place] [address]   Respiratory Panel by RT PCR (Flu A&B, Covid) - Nasopharyngeal Swab     Status: None   Collection Time: 05/30/20  3:53 PM   Specimen:  Nasopharyngeal Swab  Result Value Ref Range Status   SARS Coronavirus 2 by RT PCR NEGATIVE NEGATIVE Final    Comment: (NOTE) SARS-CoV-2 target nucleic acids are NOT DETECTED.  The SARS-CoV-2 RNA is generally detectable in upper respiratoy specimens during the acute phase of infection. The lowest concentration of SARS-CoV-2 viral copies this assay can detect is 131 copies/mL. A negative result does not preclude SARS-Cov-2 infection and should not be used as the sole basis for treatment or other patient management decisions. A negative result may occur with  improper specimen collection/handling, submission of specimen other than nasopharyngeal swab, presence of viral mutation(s) within the areas targeted by this assay, and inadequate number of viral copies (<131 copies/mL). A negative result must be combined with clinical observations, patient history, and epidemiological information. The expected result is Negative.  Fact Sheet for Patients:  PinkCheek.be  Fact Sheet for Healthcare Providers:  GravelBags.it  This test is no t yet approved or cleared by the [place] FDA and  has been authorized for detection and/or diagnosis of SARS-CoV-2 by FDA under an Emergency Use Authorization (EUA). This EUA will remain  in effect (meaning this test can be used) for the duration of the COVID-19 declaration under Section 564(b)(1) of the Act, 21 U.S.C. section 360bbb-3(b)(1), unless the authorization is terminated or revoked sooner.     Influenza A by PCR NEGATIVE NEGATIVE Final   Influenza B by PCR NEGATIVE NEGATIVE Final    Comment: (NOTE) The Xpert Xpress SARS-CoV-2/FLU/RSV assay is intended as an aid in  the diagnosis of influenza from Nasopharyngeal swab specimens and  should not be used as a sole basis for treatment. Nasal washings and  aspirates are unacceptable for Xpert Xpress SARS-CoV-2/FLU/RSV  testing.  Fact Sheet  for Patients: PinkCheek.be  Fact Sheet for Healthcare Providers: GravelBags.it  This test is not yet approved or cleared by the [place] FDA and  has been authorized for detection and/or diagnosis of SARS-CoV-2 by  FDA under an Emergency Use Authorization (EUA). This EUA will remain  in effect (meaning this test can be used) for the duration of the  Covid-19 declaration under Section 564(b)(1) of the Act, 21  U.S.C. section 360bbb-3(b)(1), unless the authorization is  terminated or revoked. Performed at [facility], [place] [address], [place], [place] [address]   Culture, blood (Routine X 2) w Reflex to ID Panel     Status: None (Preliminary result)   Collection Time: 05/31/20  5:06 AM   Specimen: BLOOD LEFT ARM  Result Value Ref Range Status   Specimen Description BLOOD LEFT ARM  Final   Special Requests   Final    BOTTLES DRAWN AEROBIC AND ANAEROBIC Blood Culture adequate volume   Culture   Final    NO GROWTH 1 DAY Performed at [facility], [place] [address] [place], [place] [address]    Report Status PENDING  Incomplete  Culture, blood (Routine X 2) w Reflex to ID Panel     Status:  None (Preliminary result)   Collection Time: 05/31/20  5:06 AM   Specimen: BLOOD RIGHT HAND  Result Value Ref Range Status   Specimen Description BLOOD RIGHT HAND  Final   Special Requests   Final    BOTTLES DRAWN AEROBIC ONLY Blood Culture adequate volume   Culture   Final    NO GROWTH 1 DAY Performed at [facility], [address], [place], [place] [address]    Report Status PENDING  Incomplete  Aerobic/Anaerobic Culture (surgical/deep wound)     Status: None (Preliminary result)   Collection Time: 05/31/20  5:09 PM   Specimen: Abscess  Result Value Ref Range Status   Specimen Description ABSCESS  Final   Special Requests DRAIN  Final   Gram Stain   Final    RARE WBC PRESENT, PREDOMINANTLY PMN NO ORGANISMS  SEEN    Culture   Final    NO GROWTH < 24 HOURS Performed at [facility], [place] [address], [place], [place] [address]    Report Status PENDING  Incomplete     RN Pressure Injury Documentation:     Estimated body mass index is 26.73 kg/m as calculated from the following:   Height as of this encounter: 5\' 9"  (1.753 m).   Weight as of this encounter: 82.1 kg.  Malnutrition Type:  Nutrition Problem: Increased nutrient needs Etiology: acute illness (sepsis 2/2 CAP)   Malnutrition Characteristics:  Signs/Symptoms: estimated needs   Nutrition Interventions:  Interventions: MVI, Premier Protein   Radiology Studies: CT Chest W Contrast  Result Date: 05/30/2020 CLINICAL DATA:  Pneumonia, effusion or abscess suspected, xray done EXAM: CT CHEST WITH CONTRAST TECHNIQUE: Multidetector CT imaging of the chest was performed during intravenous contrast administration. CONTRAST:  93mL OMNIPAQUE IOHEXOL 300 MG/ML  SOLN COMPARISON:  Chest x-ray 05/30/2020. FINDINGS: Cardiovascular: Normal heart size. No significant pericardial effusion. 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 The thoracic aorta is normal in caliber. No atherosclerotic plaque of the thoracic aorta. Mild left anterior descending coronary artery calcifications. The main pulmonary artery is normal in caliber. No central or segmental pulmonary embolus. Mediastinum/Nodes: No enlarged mediastinal, hilar, or axillary lymph nodes. Thyroid gland, trachea, and esophagus demonstrate no significant findings. Lungs/Pleura: Incidentally noted azygos fissure. Bilateral lower lobe passive atelectasis that appears slightly hypodense and heterogeneous. Linear atelectasis versus scarring within the lungs. No definite focal consolidation. Calcified granuloma at the right base. Traction bronchiectasis within bilateral lower lobes. Mild bronchial wall thickening within bilateral lower  lobes. No pulmonary mass within the aerated lungs. Moderate left pleural effusion that appears to be loculated at the apex (3:36. Trace to small volume right pleural effusion. No pneumothorax. Upper Abdomen: No acute abnormality. Musculoskeletal: No chest wall abnormality No suspicious lytic or blastic osseous lesions. Old healed sternal fracture. No acute displaced fracture. Multilevel degenerative changes of the spine. IMPRESSION: 1. Moderate loculated left pleural effusion. 2. Trace to small volume right pleural effusion. 3. Bilateral lower lobe passive atelectasis with underlying infection/inflammation not excluded. 4. 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 Aortic Atherosclerosis (ICD10-I70.0) - mild. Mild left anterior descending coronary artery calcification. Electronically Signed   By: Iven Finn M.D.   On: 05/30/2020 19:12   DG Chest Port 1 View  Result Date: 06/01/2020 CLINICAL DATA:  Chest tube placement. EXAM: PORTABLE CHEST 1 VIEW COMPARISON:  05/30/2020 FINDINGS: New left pleural drain without evidence of left pneumothorax.  Left pleural effusion has decreased in the interval. Loculated component in the left apex is similar. Small right pleural effusion persists. Residual basilar collapse/consolidative opacity without substantial change. The cardio pericardial silhouette is enlarged. The visualized bony structures of the thorax show no acute abnormality. IMPRESSION: Interval decrease in basilar left pleural effusion status post chest tube placement. No pleural gas visible. Otherwise stable exam. Electronically Signed   By: Misty Stanley M.D.   On: 06/01/2020 06:52   DG Chest Portable 1 View  Result Date: 05/30/2020 CLINICAL DATA:  Shortness of breath for several days EXAM: PORTABLE CHEST 1 VIEW COMPARISON:  05/26/2020 FINDINGS: Cardiac shadow is stable. Aortic calcifications are again seen. Bilateral pleural effusions are noted left greater than  right. The right is improved when compare with the prior exam although the left appears increased when compared prior exam underlying atelectasis/infiltrate is likely present. No bony abnormality is noted at this time. IMPRESSION: Bilateral effusions left greater than right. The left effusion has increased in the interval from the prior exam. Right effusion appears improved. Electronically Signed   By: Inez Catalina M.D.   On: 05/30/2020 16:46   ECHOCARDIOGRAM COMPLETE  Result Date: 05/31/2020    ECHOCARDIOGRAM REPORT   Patient Name:   TAVEON ENYEART Date of Exam: 05/31/2020 Medical Rec #:  [id number]        Height:       69.0 in Accession #:    [id number]       Weight:       176.6 lb Date of Birth:  February 17, 1941        BSA:          1.960 m Patient Age:    89 years         BP:           140/78 mmHg Patient Gender: M                HR:           84 bpm. Exam Location:  Inpatient Procedure: 2D Echo, Color Doppler and Cardiac Doppler Indications:    R06.9 DOE  History:        Patient has no prior history of Echocardiogram examinations.                 Risk Factors:Hypertension and Diabetes.  Sonographer:    Raquel Sarna Senior RDCS Referring Phys: [id number] [place]  Sonographer Comments: Technically difficult study due to poor echo windows, possibly lung interference IMPRESSIONS  1. Left ventricular ejection fraction, by estimation, is 60 to 65%. The left ventricle has normal function. The left ventricle has no regional wall motion abnormalities. There is moderate asymmetric left ventricular hypertrophy of the basal-septal segment. Left ventricular diastolic parameters are consistent with Grade I diastolic dysfunction (impaired relaxation).  2. Right ventricular systolic function is normal. The right ventricular size is normal.  3. The mitral valve is normal in structure. No evidence of mitral valve regurgitation. No evidence of mitral stenosis.  4. The aortic valve is normal in structure. Aortic valve regurgitation is  not visualized. No aortic stenosis is present.  5. The inferior vena cava is normal in size with greater than 50% respiratory variability, suggesting right atrial pressure of 3 mmHg. FINDINGS  Left Ventricle: Left ventricular ejection fraction, by estimation, is 60 to 65%. The left ventricle has normal function. The left ventricle has no regional wall motion abnormalities. The left ventricular internal cavity size was normal in size. There  is  moderate asymmetric left ventricular hypertrophy of the basal-septal segment. Left ventricular diastolic parameters are consistent with Grade I diastolic dysfunction (impaired relaxation). Indeterminate filling pressures. Right Ventricle: The right ventricular size is normal. No increase in right ventricular wall thickness. Right ventricular systolic function is normal. Left Atrium: Left atrial size was normal in size. Right Atrium: Right atrial size was normal in size. Pericardium: There is no evidence of pericardial effusion. Mitral Valve: The mitral valve is normal in structure. No evidence of mitral valve regurgitation. No evidence of mitral valve stenosis. Tricuspid Valve: The tricuspid valve is normal in structure. Tricuspid valve regurgitation is not demonstrated. No evidence of tricuspid stenosis. Aortic Valve: The aortic valve is normal in structure. Aortic valve regurgitation is not visualized. No aortic stenosis is present. Pulmonic Valve: The pulmonic valve was normal in structure. Pulmonic valve regurgitation is not visualized. No evidence of pulmonic stenosis. Aorta: The aortic root is normal in size and structure. Venous: The inferior vena cava is normal in size with greater than 50% respiratory variability, suggesting right atrial pressure of 3 mmHg. IAS/Shunts: No atrial level shunt detected by color flow Doppler.  LEFT VENTRICLE PLAX 2D LVIDd:         3.80 cm  Diastology LVIDs:         2.60 cm  LV e' medial:    5.11 cm/s LV PW:         1.00 cm  LV E/e'  medial:  12.9 LV IVS:        1.40 cm  LV e' lateral:   5.33 cm/s LVOT diam:     2.10 cm  LV E/e' lateral: 12.4 LV SV:         54 LV SV Index:   27 LVOT Area:     3.46 cm  RIGHT VENTRICLE RV S prime:     7.51 cm/s TAPSE (M-mode): 1.7 cm LEFT ATRIUM             Index       RIGHT ATRIUM           Index LA diam:        2.90 cm 1.48 cm/m  RA Area:     14.40 cm LA Vol (A2C):   33.2 ml 16.94 ml/m RA Volume:   32.60 ml  16.64 ml/m LA Vol (A4C):   23.5 ml 11.99 ml/m LA Biplane Vol: 27.6 ml 14.09 ml/m  AORTIC VALVE LVOT Vmax:   89.50 cm/s LVOT Vmean:  65.300 cm/s LVOT VTI:    0.155 m  AORTA Ao Root diam: 3.40 cm MITRAL VALVE MV Area (PHT): 1.62 cm    SHUNTS MV Decel Time: 467 msec    Systemic VTI:  0.16 m MV E velocity: 66.10 cm/s  Systemic Diam: 2.10 cm MV A velocity: 98.50 cm/s MV E/A ratio:  0.67 Skeet Latch MD Electronically signed by Skeet Latch MD Signature Date/Time: 05/31/2020/11:29:55 AM    Final    Scheduled Meds: . enoxaparin (LOVENOX) injection  40 mg Subcutaneous Q24H  . gabapentin  600 mg Oral QHS  . guaiFENesin  600 mg Oral BID  . insulin aspart  0-9 Units Subcutaneous TID WC  . insulin glargine  15 Units Subcutaneous QHS  . lisinopril  40 mg Oral Daily  . multivitamin with minerals  1 tablet Oral Daily  . pantoprazole  40 mg Oral Daily  . Ensure Max Protein  11 oz Oral BID  . sodium chloride flush  3 mL Intravenous  Q12H  . triamterene-hydrochlorothiazide  1 tablet Oral Daily   Continuous Infusions: . azithromycin 500 mg (05/31/20 2204)  . cefTRIAXone (ROCEPHIN)  IV 2 g (05/31/20 0841)  . magnesium sulfate bolus IVPB      LOS: 2 days   Kerney Elbe, DO [facility] PAGER is on [place]  If 7PM-7AM, please contact night-coverage [email or web address]

## 2020-06-01 NOTE — Progress Notes (Signed)
Chief Complaint: Patient was seen in consultation today for left chest drain   Supervising Physician: Dr. Serafina Royals  Patient Status: [facility] - In-pt  History of Present Illness: Eduardo Murray is a 79 y.o. male with left pleural effusion s/p perc drain placed yesterday. Doing better, sitting up in bed eating. Denies SOB, no supp O2 in use Wife at bedside  Past Medical History:  Diagnosis Date  . Arthritis   . Diabetes mellitus without complication ([place])    type 2  . Hypertension   . Pneumonia   . Primary localized osteoarthritis of left hip 03/24/2019  . TMJ (temporomandibular joint disorder)     Past Surgical History:  Procedure Laterality Date  . right elbow surgery    . right hand surgery    . ROTATOR CUFF REPAIR Left   . 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  Medications: Current Outpatient Medications  Medication Instructions  . acetaminophen (TYLENOL) 500 mg, Oral, Every 6 hours PRN  . aspirin EC 325 mg, Oral, 2 times daily  . aspirin EC 81 mg, Oral, Daily, Swallow whole.  . baclofen (LIORESAL) 10 mg, Oral, 3 times daily, As needed for muscle spasm  . gabapentin (NEURONTIN) 600 mg, Oral, Daily at bedtime  . HYDROcodone-acetaminophen (NORCO) 10-325 MG tablet 1 tablet, Oral, Every 6 hours PRN  . Insulin Glargine (TOUJEO MAX SOLOSTAR [place]) 30 Units, Subcutaneous, Daily  . lisinopril (ZESTRIL) 40 mg, Oral, Daily  . metFORMIN (GLUCOPHAGE) 1,000 mg, Oral, 2 times daily with meals  . ondansetron (ZOFRAN) 4 mg, Oral, Every 8 hours PRN  . pantoprazole (PROTONIX) 40 mg, Oral, Daily  . sennosides-docusate sodium (SENOKOT-S) 8.6-50 MG tablet 2 tablets, Oral, Daily  . traZODone (DESYREL) 100 mg, Oral, Daily at bedtime  . triamterene-hydrochlorothiazide (MAXZIDE-25) 37.5-25 MG tablet 1 tablet, Oral,  Daily  . Trulicity 1.5 mg, Subcutaneous, Every Sun  . vitamin B-12 (CYANOCOBALAMIN) 1,000 mcg, Oral, Daily      Family History  Problem Relation Age of Onset  . Cancer Father   . Diabetes Mother   . Cancer Sister   . Diabetes Sister   . Diabetes Brother     Social History   Socioeconomic History  . Marital status: Married    Spouse name: Not on file  . Number of children: Not on file  . Years of education: Not on file  . Highest education level: Not on file  Occupational History  . Not on file  Tobacco Use  . Smoking status: Former Smoker    Packs/day: 1.00    Years: 30.00    Pack years: 30.00    Types: Cigarettes    Quit date: 12/02/1992    Years since quitting: 27.5  . Smokeless tobacco: Former Systems developer    Types: Snuff    Quit date: 12/03/2007  Vaping Use  . Vaping Use: Never used  Substance and Sexual Activity  . Alcohol use: No  . Drug use: No  . Sexual activity: Not Currently  Other Topics Concern  . Not on file  Social History Narrative  . Not on file   Social Determinants of Health   Financial Resource Strain:   . Difficulty of Paying Living Expenses: Not on file  Food Insecurity:   . Worried About Charity fundraiser in the Last Year: Not on file  .  Ran Out of Food in the Last Year: Not on file  Transportation Needs:   . Lack of Transportation (Medical): Not on file  . Lack of Transportation (Non-Medical): Not on file  Physical Activity:   . Days of Exercise per Week: Not on file  . Minutes of Exercise per Session: Not on file  Stress:   . Feeling of Stress : Not on file  Social Connections:   . Frequency of Communication with Friends and Family: Not on file  . Frequency of Social Gatherings with Friends and Family: Not on file  . Attends Religious Services: Not on file  . Active Member of Clubs or Organizations: Not on file  . Attends Archivist Meetings: Not on file  . Marital Status: Not on file    Review of Systems: A 12 point ROS  discussed and pertinent positives are indicated in the HPI above.  All other systems are negative.  Review of Systems  Vital Signs: BP 109/61 (BP Location: Left Arm)   Pulse 81   Temp 98.1 F (36.7 C)   Resp 16   Ht 5\' 9"  (1.753 m)   Wt 82.1 kg   SpO2 94%   BMI 26.73 kg/m   Physical Exam Constitutional:      Appearance: Normal appearance.  HENT:     Head: Normocephalic.  Cardiovascular:     Rate and Rhythm: Normal rate and regular rhythm.     Heart sounds: Normal heart sounds.  Pulmonary:     Effort: Pulmonary effort is normal.     Breath sounds: Normal breath sounds.     Comments: (L)chest tube intact, site clean Neurological:     Mental Status: He is alert.     Imaging: DG Chest 2 View  Result Date: 05/26/2020 CLINICAL DATA:  Shortness of breath. EXAM: CHEST - 2 VIEW COMPARISON:  November 24, 2012. FINDINGS: Stable cardiomediastinal silhouette. No pneumothorax is noted. Moderate bilateral pleural effusions are noted with associated atelectasis. Bony thorax is unremarkable. IMPRESSION: Moderate bilateral pleural effusions with associated atelectasis. Electronically Signed   By: Marijo Conception M.D.   On: 05/26/2020 17:18   CT Chest W Contrast  Result Date: 05/30/2020 CLINICAL DATA:  Pneumonia, effusion or abscess suspected, xray done EXAM: CT CHEST WITH CONTRAST TECHNIQUE: Multidetector CT imaging of the chest was performed during intravenous contrast administration. CONTRAST:  59mL OMNIPAQUE IOHEXOL 300 MG/ML  SOLN COMPARISON:  Chest x-ray 05/30/2020. FINDINGS: Cardiovascular: Normal heart size. No significant pericardial effusion. 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 The thoracic aorta is normal in caliber. No atherosclerotic plaque of the thoracic aorta. Mild left anterior descending coronary artery calcifications. The main pulmonary artery is normal in caliber. No central or segmental pulmonary embolus.  Mediastinum/Nodes: No enlarged mediastinal, hilar, or axillary lymph nodes. Thyroid gland, trachea, and esophagus demonstrate no significant findings. Lungs/Pleura: Incidentally noted azygos fissure. Bilateral lower lobe passive atelectasis that appears slightly hypodense and heterogeneous. Linear atelectasis versus scarring within the lungs. No definite focal consolidation. Calcified granuloma at the right base. Traction bronchiectasis within bilateral lower lobes. Mild bronchial wall thickening within bilateral lower lobes. No pulmonary mass within the aerated lungs. Moderate left pleural effusion that appears to be loculated at the apex (3:36. Trace to small volume right pleural effusion. No pneumothorax. Upper Abdomen: No acute abnormality. Musculoskeletal: No chest wall abnormality No suspicious lytic or blastic osseous lesions. Old healed sternal fracture. No acute displaced fracture. Multilevel  degenerative changes of the spine. IMPRESSION: 1. Moderate loculated left pleural effusion. 2. Trace to small volume right pleural effusion. 3. Bilateral lower lobe passive atelectasis with underlying infection/inflammation not excluded. 4. 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 Aortic Atherosclerosis (ICD10-I70.0) - mild. Mild left anterior descending coronary artery calcification. Electronically Signed   By: Iven Finn M.D.   On: 05/30/2020 19:12   DG Chest Port 1 View  Result Date: 06/01/2020 CLINICAL DATA:  Chest tube placement. EXAM: PORTABLE CHEST 1 VIEW COMPARISON:  05/30/2020 FINDINGS: New left pleural drain without evidence of left pneumothorax. Left pleural effusion has decreased in the interval. Loculated component in the left apex is similar. Small right pleural effusion persists. Residual basilar collapse/consolidative opacity without substantial change. The cardio pericardial silhouette is enlarged. The visualized bony structures of the thorax show  no acute abnormality. IMPRESSION: Interval decrease in basilar left pleural effusion status post chest tube placement. No pleural gas visible. Otherwise stable exam. Electronically Signed   By: Misty Stanley M.D.   On: 06/01/2020 06:52   DG Chest Portable 1 View  Result Date: 05/30/2020 CLINICAL DATA:  Shortness of breath for several days EXAM: PORTABLE CHEST 1 VIEW COMPARISON:  05/26/2020 FINDINGS: Cardiac shadow is stable. Aortic calcifications are again seen. Bilateral pleural effusions are noted left greater than right. The right is improved when compare with the prior exam although the left appears increased when compared prior exam underlying atelectasis/infiltrate is likely present. No bony abnormality is noted at this time. IMPRESSION: Bilateral effusions left greater than right. The left effusion has increased in the interval from the prior exam. Right effusion appears improved. Electronically Signed   By: Inez Catalina M.D.   On: 05/30/2020 16:46   ECHOCARDIOGRAM COMPLETE  Result Date: 05/31/2020    ECHOCARDIOGRAM REPORT   Patient Name:   Eduardo Murray Date of Exam: 05/31/2020 Medical Rec #:  [id number]        Height:       69.0 in Accession #:    [id number]       Weight:       176.6 lb Date of Birth:  [birth year]        BSA:          1.960 m Patient Age:    74 years         BP:           140/78 mmHg Patient Gender: M                HR:           84 bpm. Exam Location:  Inpatient Procedure: 2D Echo, Color Doppler and Cardiac Doppler Indications:    R06.9 DOE  History:        Patient has no prior history of Echocardiogram examinations.                 Risk Factors:Hypertension and Diabetes.  Sonographer:    Raquel Sarna Senior RDCS Referring Phys: [id number] [place]  Sonographer Comments: Technically difficult study due to poor echo windows, possibly lung interference IMPRESSIONS  1. Left ventricular ejection fraction, by estimation, is 60 to 65%. The left ventricle has normal function. The left  ventricle has no regional wall motion abnormalities. There is moderate asymmetric left ventricular hypertrophy of the basal-septal segment. Left ventricular diastolic parameters are consistent with Grade I diastolic dysfunction (impaired relaxation).  2. Right ventricular systolic function is normal. The right ventricular size  is normal.  3. The mitral valve is normal in structure. No evidence of mitral valve regurgitation. No evidence of mitral stenosis.  4. The aortic valve is normal in structure. Aortic valve regurgitation is not visualized. No aortic stenosis is present.  5. The inferior vena cava is normal in size with greater than 50% respiratory variability, suggesting right atrial pressure of 3 mmHg. FINDINGS  Left Ventricle: Left ventricular ejection fraction, by estimation, is 60 to 65%. The left ventricle has normal function. The left ventricle has no regional wall motion abnormalities. The left ventricular internal cavity size was normal in size. There is  moderate asymmetric left ventricular hypertrophy of the basal-septal segment. Left ventricular diastolic parameters are consistent with Grade I diastolic dysfunction (impaired relaxation). Indeterminate filling pressures. Right Ventricle: The right ventricular size is normal. No increase in right ventricular wall thickness. Right ventricular systolic function is normal. Left Atrium: Left atrial size was normal in size. Right Atrium: Right atrial size was normal in size. Pericardium: There is no evidence of pericardial effusion. Mitral Valve: The mitral valve is normal in structure. No evidence of mitral valve regurgitation. No evidence of mitral valve stenosis. Tricuspid Valve: The tricuspid valve is normal in structure. Tricuspid valve regurgitation is not demonstrated. No evidence of tricuspid stenosis. Aortic Valve: The aortic valve is normal in structure. Aortic valve regurgitation is not visualized. No aortic stenosis is present. Pulmonic Valve:  The pulmonic valve was normal in structure. Pulmonic valve regurgitation is not visualized. No evidence of pulmonic stenosis. Aorta: The aortic root is normal in size and structure. Venous: The inferior vena cava is normal in size with greater than 50% respiratory variability, suggesting right atrial pressure of 3 mmHg. IAS/Shunts: No atrial level shunt detected by color flow Doppler.  LEFT VENTRICLE PLAX 2D LVIDd:         3.80 cm  Diastology LVIDs:         2.60 cm  LV e' medial:    5.11 cm/s LV PW:         1.00 cm  LV E/e' medial:  12.9 LV IVS:        1.40 cm  LV e' lateral:   5.33 cm/s LVOT diam:     2.10 cm  LV E/e' lateral: 12.4 LV SV:         54 LV SV Index:   27 LVOT Area:     3.46 cm  RIGHT VENTRICLE RV S prime:     7.51 cm/s TAPSE (M-mode): 1.7 cm LEFT ATRIUM             Index       RIGHT ATRIUM           Index LA diam:        2.90 cm 1.48 cm/m  RA Area:     14.40 cm LA Vol (A2C):   33.2 ml 16.94 ml/m RA Volume:   32.60 ml  16.64 ml/m LA Vol (A4C):   23.5 ml 11.99 ml/m LA Biplane Vol: 27.6 ml 14.09 ml/m  AORTIC VALVE LVOT Vmax:   89.50 cm/s LVOT Vmean:  65.300 cm/s LVOT VTI:    0.155 m  AORTA Ao Root diam: 3.40 cm MITRAL VALVE MV Area (PHT): 1.62 cm    SHUNTS MV Decel Time: 467 msec    Systemic VTI:  0.16 m MV E velocity: 66.10 cm/s  Systemic Diam: 2.10 cm MV A velocity: 98.50 cm/s MV E/A ratio:  0.67 Skeet Latch MD Electronically signed by  Skeet Latch MD Signature Date/Time: 05/31/2020/11:29:55 AM    Final    CT IMAGE GUIDED DRAINAGE BY PERCUTANEOUS CATHETER  Result Date: 06/01/2020 INDICATION: Concern for empyema and loculated left-sided pleural effusion. Please perform CT-guided chest tube for infection source control purposes. EXAM: CT IMAGE GUIDED DRAINAGE BY PERCUTANEOUS CATHETER COMPARISON:  Chest CT-05/30/2020; chest radiograph-05/30/2020 MEDICATIONS: The patient is currently admitted to the hospital and receiving intravenous antibiotics. The antibiotics were administered within  an appropriate time frame prior to the initiation of the procedure. ANESTHESIA/SEDATION: Moderate (conscious) sedation was employed during this procedure. A total of Versed 1.5 mg and Fentanyl 75 mcg was administered intravenously. Moderate Sedation Time: 19 minutes. The patient's level of consciousness and vital signs were monitored continuously by radiology nursing throughout the procedure under my direct supervision. CONTRAST:  None COMPLICATIONS: None immediate. PROCEDURE: Informed written consent was obtained from the patient after a discussion of the risks, benefits and alternatives to treatment. The patient was placed supine, slightly RPO on the CT gantry and a pre procedural CT was performed re-demonstrating the known moderate to large size likely partially loculated left-sided pleural effusion. The procedure was planned. A timeout was performed prior to the initiation of the procedure. The skin overlying the posteroinferior lateral aspect of the left chest was prepped and draped in the usual sterile fashion. The overlying soft tissues were anesthetized with 1% lidocaine with epinephrine. Appropriate trajectory was planned with the use of a 22 gauge spinal needle. An 18 gauge trocar needle was advanced into the abscess/fluid collection and a short Amplatz super stiff wire was coiled within the collection. Appropriate positioning was confirmed with a limited CT scan. The tract was serially dilated allowing placement of a 14 French all-purpose drainage catheter. Appropriate positioning was confirmed with a limited postprocedural CT scan. Approximately 700 cc of serous pleural fluid was aspirated following drainage catheter placement. The tube was connected to a pleura vac device and sutured in place. A dressing was placed. The patient tolerated the procedure well without immediate post procedural complication. IMPRESSION: Successful CT guided placement of a 41 French all purpose drain catheter into the left  pleural space with aspiration of 700 cc of serous pleural fluid. Samples were sent to the laboratory as requested by the ordering clinical team. Electronically Signed   By: Sandi Mariscal M.D.   On: 06/01/2020 07:57    Labs:  CBC: Recent Labs    05/26/20 1645 05/30/20 1551 05/31/20 0506 06/01/20 0240  WBC 13.0* 14.6* 10.1 10.8*  HGB 13.2 13.6 12.7* 12.3*  HCT 39.1 42.8 39.5 38.7*  PLT 404* 372 280 329    COAGS: No results for input(s): INR, APTT in the last 8760 hours.  BMP: Recent Labs    05/26/20 1645 05/30/20 1551 05/31/20 0506 06/01/20 0240  NA 133* 138 138 133*  K 3.8 3.9 3.4* 3.9  CL 100 97* 99 95*  CO2 19* 27 28 28   GLUCOSE 204* 201* 91 219*  BUN 30* 21 21 29*  CALCIUM 8.6* 8.7* 8.5* 8.6*  CREATININE 1.30* 1.09 1.00 1.16  GFRNONAA 52* >60 >60 60*  GFRAA >60 >60 >60  --     LIVER FUNCTION TESTS: Recent Labs    05/26/20 1645 06/01/20 0240  BILITOT 0.7 0.7  AST 30 16  ALT 36 21  ALKPHOS 60 47  PROT 6.6 6.1*  ALBUMIN 2.5* 2.2*    TUMOR MARKERS: No results for input(s): AFPTM, CEA, CA199, CHROMGRNA in the last 8760 hours.  Assessment and Plan: (  L)pleural effusion S/p perc drain 10/5 Clinically improving IR following  Thank you for this interesting consult.  I greatly enjoyed meeting Righteous L Tench and look forward to participating in their care.  A copy of this report was sent to the requesting provider on this date.  Electronically Signed: Ascencion Dike, PA-C 06/01/2020, 9:44 AM   I spent a total of 15 minutes in face to face in clinical consultation, greater than 50% of which was counseling/coordinating care for (L)chest tube

## 2020-06-01 NOTE — Progress Notes (Addendum)
[address]       [place] [address]             [phone]         Subjective: He is breathing a little more comfortably  Objective: Vital signs in last 24 hours: Temp:  [97.5 F (36.4 C)-98.1 F (36.7 C)] 98.1 F (36.7 C) (10/06 0738) Pulse Rate:  [80-100] 81 (10/06 0738) Cardiac Rhythm: Bundle branch block;Heart block (10/06 0704) Resp:  [14-25] 16 (10/06 0738) BP: (88-140)/(52-78) 109/61 (10/06 0738) SpO2:  [93 %-98 %] 94 % (10/06 0738) Weight:  [80.1 kg-82.1 kg] 82.1 kg (10/06 0647)  Hemodynamic parameters for last 24 hours:    Intake/Output from previous day: 10/05 0701 - 10/06 0700 In: 340 [P.O.:240; IV Piggyback:100] Out: 1400 [Urine:700; Chest Tube:700] Intake/Output this shift: No intake/output data recorded.  General appearance: alert, cooperative and no distress Heart: regular rate and rhythm Lungs: dim in bases Abdomen: benign  Lab Results: Recent Labs    05/31/20 0506 06/01/20 0240  WBC 10.1 10.8*  HGB 12.7* 12.3*  HCT 39.5 38.7*  PLT 280 329   BMET:  Recent Labs    05/31/20 0506 06/01/20 0240  NA 138 133*  K 3.4* 3.9  CL 99 95*  CO2 28 28  GLUCOSE 91 219*  BUN 21 29*  CREATININE 1.00 1.16  CALCIUM 8.5* 8.6*    PT/INR: No results for input(s): LABPROT, INR in the last 72 hours. ABG    Component Value Date/Time   TCO2 26 11/24/2012 1640   CBG (last 3)  Recent Labs    05/31/20 1137 05/31/20 1753 05/31/20 2235  GLUCAP 140* 135* 326*    Meds Scheduled Meds: . enoxaparin (LOVENOX) injection  40 mg Subcutaneous Q24H  . gabapentin  600 mg Oral QHS  . guaiFENesin  600 mg Oral BID  . insulin aspart  0-9 Units Subcutaneous TID WC  . insulin glargine  15 Units Subcutaneous QHS  . lisinopril  40 mg Oral Daily  . multivitamin with minerals  1 tablet Oral Daily  . pantoprazole  40 mg Oral Daily  . Ensure Max Protein  11 oz Oral BID  . sodium chloride flush  3 mL Intravenous Q12H  .  triamterene-hydrochlorothiazide  1 tablet Oral Daily   Continuous Infusions: . azithromycin 500 mg (05/31/20 2204)  . cefTRIAXone (ROCEPHIN)  IV 2 g (05/31/20 0841)  . magnesium sulfate bolus IVPB     PRN Meds:.acetaminophen **OR** acetaminophen, ondansetron (ZOFRAN) IV  Xrays CT Chest W Contrast  Result Date: 05/30/2020 CLINICAL DATA:  Pneumonia, effusion or abscess suspected, xray done EXAM: CT CHEST WITH CONTRAST TECHNIQUE: Multidetector CT imaging of the chest was performed during intravenous contrast administration. CONTRAST:  86mL OMNIPAQUE IOHEXOL 300 MG/ML  SOLN COMPARISON:  Chest x-ray 05/30/2020. FINDINGS: Cardiovascular: Normal heart size. No significant pericardial effusion. 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 The thoracic aorta is normal in caliber. No atherosclerotic plaque of the thoracic aorta. Mild left anterior descending coronary artery calcifications. The main pulmonary artery is normal in caliber. No central or segmental pulmonary embolus. Mediastinum/Nodes: No enlarged mediastinal, hilar, or axillary lymph nodes. Thyroid gland, trachea, and esophagus demonstrate no significant findings. Lungs/Pleura: Incidentally noted azygos fissure. Bilateral lower lobe passive atelectasis that appears slightly hypodense and heterogeneous. Linear atelectasis versus scarring within the lungs. No definite focal consolidation. Calcified granuloma at the right base. Traction bronchiectasis within bilateral  lower lobes. Mild bronchial wall thickening within bilateral lower lobes. No pulmonary mass within the aerated lungs. Moderate left pleural effusion that appears to be loculated at the apex (3:36. Trace to small volume right pleural effusion. No pneumothorax. Upper Abdomen: No acute abnormality. Musculoskeletal: No chest wall abnormality No suspicious lytic or blastic osseous lesions. Old healed sternal fracture. No acute displaced  fracture. Multilevel degenerative changes of the spine. IMPRESSION: 1. Moderate loculated left pleural effusion. 2. Trace to small volume right pleural effusion. 3. Bilateral lower lobe passive atelectasis with underlying infection/inflammation not excluded. 4. 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 Aortic Atherosclerosis (ICD10-I70.0) - mild. Mild left anterior descending coronary artery calcification. Electronically Signed   By: Iven Finn M.D.   On: 05/30/2020 19:12   DG Chest Port 1 View  Result Date: 06/01/2020 CLINICAL DATA:  Chest tube placement. EXAM: PORTABLE CHEST 1 VIEW COMPARISON:  05/30/2020 FINDINGS: New left pleural drain without evidence of left pneumothorax. Left pleural effusion has decreased in the interval. Loculated component in the left apex is similar. Small right pleural effusion persists. Residual basilar collapse/consolidative opacity without substantial change. The cardio pericardial silhouette is enlarged. The visualized bony structures of the thorax show no acute abnormality. IMPRESSION: Interval decrease in basilar left pleural effusion status post chest tube placement. No pleural gas visible. Otherwise stable exam. Electronically Signed   By: Misty Stanley M.D.   On: 06/01/2020 06:52   DG Chest Portable 1 View  Result Date: 05/30/2020 CLINICAL DATA:  Shortness of breath for several days EXAM: PORTABLE CHEST 1 VIEW COMPARISON:  05/26/2020 FINDINGS: Cardiac shadow is stable. Aortic calcifications are again seen. Bilateral pleural effusions are noted left greater than right. The right is improved when compare with the prior exam although the left appears increased when compared prior exam underlying atelectasis/infiltrate is likely present. No bony abnormality is noted at this time. IMPRESSION: Bilateral effusions left greater than right. The left effusion has increased in the interval from the prior exam. Right effusion appears  improved. Electronically Signed   By: Inez Catalina M.D.   On: 05/30/2020 16:46   ECHOCARDIOGRAM COMPLETE  Result Date: 05/31/2020    ECHOCARDIOGRAM REPORT   Patient Name:   Eduardo Murray Date of Exam: 05/31/2020 Medical Rec #:  [id number]        Height:       69.0 in Accession #:    [id number]       Weight:       176.6 lb Date of Birth:  [birth year]        BSA:          1.960 m Patient Age:    79 years         BP:           140/78 mmHg Patient Gender: M                HR:           84 bpm. Exam Location:  Inpatient Procedure: 2D Echo, Color Doppler and Cardiac Doppler Indications:    R06.9 DOE  History:        Patient has no prior history of Echocardiogram examinations.                 Risk Factors:Hypertension and Diabetes.  Sonographer:    Raquel Sarna Senior RDCS Referring Phys: [id number] [place]  Sonographer Comments: Technically difficult study due to poor echo windows, possibly lung  interference IMPRESSIONS  1. Left ventricular ejection fraction, by estimation, is 60 to 65%. The left ventricle has normal function. The left ventricle has no regional wall motion abnormalities. There is moderate asymmetric left ventricular hypertrophy of the basal-septal segment. Left ventricular diastolic parameters are consistent with Grade I diastolic dysfunction (impaired relaxation).  2. Right ventricular systolic function is normal. The right ventricular size is normal.  3. The mitral valve is normal in structure. No evidence of mitral valve regurgitation. No evidence of mitral stenosis.  4. The aortic valve is normal in structure. Aortic valve regurgitation is not visualized. No aortic stenosis is present.  5. The inferior vena cava is normal in size with greater than 50% respiratory variability, suggesting right atrial pressure of 3 mmHg. FINDINGS  Left Ventricle: Left ventricular ejection fraction, by estimation, is 60 to 65%. The left ventricle has normal function. The left ventricle has no regional wall motion  abnormalities. The left ventricular internal cavity size was normal in size. There is  moderate asymmetric left ventricular hypertrophy of the basal-septal segment. Left ventricular diastolic parameters are consistent with Grade I diastolic dysfunction (impaired relaxation). Indeterminate filling pressures. Right Ventricle: The right ventricular size is normal. No increase in right ventricular wall thickness. Right ventricular systolic function is normal. Left Atrium: Left atrial size was normal in size. Right Atrium: Right atrial size was normal in size. Pericardium: There is no evidence of pericardial effusion. Mitral Valve: The mitral valve is normal in structure. No evidence of mitral valve regurgitation. No evidence of mitral valve stenosis. Tricuspid Valve: The tricuspid valve is normal in structure. Tricuspid valve regurgitation is not demonstrated. No evidence of tricuspid stenosis. Aortic Valve: The aortic valve is normal in structure. Aortic valve regurgitation is not visualized. No aortic stenosis is present. Pulmonic Valve: The pulmonic valve was normal in structure. Pulmonic valve regurgitation is not visualized. No evidence of pulmonic stenosis. Aorta: The aortic root is normal in size and structure. Venous: The inferior vena cava is normal in size with greater than 50% respiratory variability, suggesting right atrial pressure of 3 mmHg. IAS/Shunts: No atrial level shunt detected by color flow Doppler.  LEFT VENTRICLE PLAX 2D LVIDd:         3.80 cm  Diastology LVIDs:         2.60 cm  LV e' medial:    5.11 cm/s LV PW:         1.00 cm  LV E/e' medial:  12.9 LV IVS:        1.40 cm  LV e' lateral:   5.33 cm/s LVOT diam:     2.10 cm  LV E/e' lateral: 12.4 LV SV:         54 LV SV Index:   27 LVOT Area:     3.46 cm  RIGHT VENTRICLE RV S prime:     7.51 cm/s TAPSE (M-mode): 1.7 cm LEFT ATRIUM             Index       RIGHT ATRIUM           Index LA diam:        2.90 cm 1.48 cm/m  RA Area:     14.40 cm LA Vol  (A2C):   33.2 ml 16.94 ml/m RA Volume:   32.60 ml  16.64 ml/m LA Vol (A4C):   23.5 ml 11.99 ml/m LA Biplane Vol: 27.6 ml 14.09 ml/m  AORTIC VALVE LVOT Vmax:   89.50 cm/s LVOT Vmean:  65.300 cm/s LVOT VTI:    0.155 m  AORTA Ao Root diam: 3.40 cm MITRAL VALVE MV Area (PHT): 1.62 cm    SHUNTS MV Decel Time: 467 msec    Systemic VTI:  0.16 m MV E velocity: 66.10 cm/s  Systemic Diam: 2.10 cm MV A velocity: 98.50 cm/s MV E/A ratio:  0.67 Skeet Latch MD Electronically signed by Skeet Latch MD Signature Date/Time: 05/31/2020/11:29:55 AM    Final    Results for orders placed or performed during the hospital encounter of 05/30/20  Respiratory Panel by RT PCR (Flu A&B, Covid) - Nasopharyngeal Swab     Status: None   Collection Time: 05/30/20  3:53 PM   Specimen: Nasopharyngeal Swab  Result Value Ref Range Status   SARS Coronavirus 2 by RT PCR NEGATIVE NEGATIVE Final    Comment: (NOTE) SARS-CoV-2 target nucleic acids are NOT DETECTED.  The SARS-CoV-2 RNA is generally detectable in upper respiratoy specimens during the acute phase of infection. The lowest concentration of SARS-CoV-2 viral copies this assay can detect is 131 copies/mL. A negative result does not preclude SARS-Cov-2 infection and should not be used as the sole basis for treatment or other patient management decisions. A negative result may occur with  improper specimen collection/handling, submission of specimen other than nasopharyngeal swab, presence of viral mutation(s) within the areas targeted by this assay, and inadequate number of viral copies (<131 copies/mL). A negative result must be combined with clinical observations, patient history, and epidemiological information. The expected result is Negative.  Fact Sheet for Patients:  PinkCheek.be  Fact Sheet for Healthcare Providers:  GravelBags.it  This test is no t yet approved or cleared by the [place]  FDA and  has been authorized for detection and/or diagnosis of SARS-CoV-2 by FDA under an Emergency Use Authorization (EUA). This EUA will remain  in effect (meaning this test can be used) for the duration of the COVID-19 declaration under Section 564(b)(1) of the Act, 21 U.S.C. section 360bbb-3(b)(1), unless the authorization is terminated or revoked sooner.     Influenza A by PCR NEGATIVE NEGATIVE Final   Influenza B by PCR NEGATIVE NEGATIVE Final    Comment: (NOTE) The Xpert Xpress SARS-CoV-2/FLU/RSV assay is intended as an aid in  the diagnosis of influenza from Nasopharyngeal swab specimens and  should not be used as a sole basis for treatment. Nasal washings and  aspirates are unacceptable for Xpert Xpress SARS-CoV-2/FLU/RSV  testing.  Fact Sheet for Patients: PinkCheek.be  Fact Sheet for Healthcare Providers: GravelBags.it  This test is not yet approved or cleared by the [place] FDA and  has been authorized for detection and/or diagnosis of SARS-CoV-2 by  FDA under an Emergency Use Authorization (EUA). This EUA will remain  in effect (meaning this test can be used) for the duration of the  Covid-19 declaration under Section 564(b)(1) of the Act, 21  U.S.C. section 360bbb-3(b)(1), unless the authorization is  terminated or revoked. Performed at [facility], [place] [address] [place], [place] [address]   Culture, blood (Routine X 2) w Reflex to ID Panel     Status: None (Preliminary result)   Collection Time: 05/31/20  5:06 AM   Specimen: BLOOD LEFT ARM  Result Value Ref Range Status   Specimen Description BLOOD LEFT ARM  Final   Special Requests   Final    BOTTLES DRAWN AEROBIC AND ANAEROBIC Blood Culture adequate volume   Culture   Final    NO GROWTH 1  DAY Performed at [facility], [place] [address], [place], [place] [address]    Report Status PENDING  Incomplete  Culture, blood (Routine X 2)  w Reflex to ID Panel     Status: None (Preliminary result)   Collection Time: 05/31/20  5:06 AM   Specimen: BLOOD RIGHT HAND  Result Value Ref Range Status   Specimen Description BLOOD RIGHT HAND  Final   Special Requests   Final    BOTTLES DRAWN AEROBIC ONLY Blood Culture adequate volume   Culture   Final    NO GROWTH 1 DAY Performed at [facility], [place] [address] [place], [place] [address]    Report Status PENDING  Incomplete  Aerobic/Anaerobic Culture (surgical/deep wound)     Status: None (Preliminary result)   Collection Time: 05/31/20  5:09 PM   Specimen: Abscess  Result Value Ref Range Status   Specimen Description ABSCESS  Final   Special Requests DRAIN  Final   Gram Stain   Final    RARE WBC PRESENT, PREDOMINANTLY PMN NO ORGANISMS SEEN    Culture   Final    NO GROWTH < 24 HOURS Performed at [facility], [place] [address] [place], [place] [address]    Report Status PENDING  Incomplete   Assessment/Plan: S/P left CT placement  1 stable following left pigtail catheter placement, some improvement in CXR appearance of left effusion, right is relatively small and stable, may benefit from drainage. Fluid is serosang, albumin is low 2 afeb, VS fairly stable with SBP 80-140's 3 no leukocytosis- conts current abx for now as cultures result 4 H/H is stable 5 medical management as per primary svc  LOS: 2 days    Eduardo Murray 06/01/2020  Culture negative on pleural fluid so far Rare WBC on pleural fluid-  Not empyema  I have seen and examined Hades L Mcnatt and agree with the above assessment  and plan.  Grace Isaac MD Beeper [phone] Office [phone] 06/01/2020 8:40 AM

## 2020-06-02 ENCOUNTER — Inpatient Hospital Stay (HOSPITAL_COMMUNITY): Payer: Medicare HMO

## 2020-06-02 LAB — COMPREHENSIVE METABOLIC PANEL
ALT: 24 U/L (ref 0–44)
AST: 21 U/L (ref 15–41)
Albumin: 2.2 g/dL — ABNORMAL LOW (ref 3.5–5.0)
Alkaline Phosphatase: 47 U/L (ref 38–126)
Anion gap: 9 (ref 5–15)
BUN: 34 mg/dL — ABNORMAL HIGH (ref 8–23)
CO2: 26 mmol/L (ref 22–32)
Calcium: 9 mg/dL (ref 8.9–10.3)
Chloride: 97 mmol/L — ABNORMAL LOW (ref 98–111)
Creatinine, Ser: 1.11 mg/dL (ref 0.61–1.24)
GFR calc non Af Amer: >60 mL/min (ref >60)
Glucose, Bld: 202 mg/dL — ABNORMAL HIGH (ref 70–99)
Potassium: 4.2 mmol/L (ref 3.5–5.1)
Sodium: 132 mmol/L — ABNORMAL LOW (ref 135–145)
Total Bilirubin: 0.8 mg/dL (ref 0.3–1.2)
Total Protein: 6.5 g/dL (ref 6.5–8.1)

## 2020-06-02 LAB — CBC WITH DIFFERENTIAL/PLATELET
Abs Immature Granulocytes: 0.14 10*3/uL — ABNORMAL HIGH (ref 0.00–0.07)
Basophils Absolute: 0.1 10*3/uL (ref 0.0–0.1)
Basophils Relative: 1 %
Eosinophils Absolute: 0.2 10*3/uL (ref 0.0–0.5)
Eosinophils Relative: 2 %
HCT: 39 % (ref 39.0–52.0)
Hemoglobin: 12.8 g/dL — ABNORMAL LOW (ref 13.0–17.0)
Immature Granulocytes: 1 %
Lymphocytes Relative: 16 %
Lymphs Abs: 1.9 10*3/uL (ref 0.7–4.0)
MCH: 29.5 pg (ref 26.0–34.0)
MCHC: 32.8 g/dL (ref 30.0–36.0)
MCV: 89.9 fL (ref 80.0–100.0)
Monocytes Absolute: 0.9 10*3/uL (ref 0.1–1.0)
Monocytes Relative: 7 %
Neutro Abs: 9 10*3/uL — ABNORMAL HIGH (ref 1.7–7.7)
Neutrophils Relative %: 73 %
Platelets: 371 10*3/uL (ref 150–400)
RBC: 4.34 MIL/uL (ref 4.22–5.81)
RDW: 13 % (ref 11.5–15.5)
WBC: 12.3 10*3/uL — ABNORMAL HIGH (ref 4.0–10.5)
nRBC: 0 % (ref 0.0–0.2)

## 2020-06-02 LAB — GLUCOSE, CAPILLARY
Glucose-Capillary: 192 mg/dL — ABNORMAL HIGH (ref 70–99)
Glucose-Capillary: 391 mg/dL — ABNORMAL HIGH (ref 70–99)
Glucose-Capillary: 171 mg/dL — ABNORMAL HIGH (ref 70–99)
Glucose-Capillary: 261 mg/dL — ABNORMAL HIGH (ref 70–99)

## 2020-06-02 LAB — PHOSPHORUS: Phosphorus: 3.5 mg/dL (ref 2.5–4.6)

## 2020-06-02 LAB — MAGNESIUM: Magnesium: 1.8 mg/dL (ref 1.7–2.4)

## 2020-06-02 MED ORDER — MAGNESIUM SULFATE 2 GM/50ML IV SOLN
2.0000 g | Freq: Once | INTRAVENOUS | Status: AC
  Administered 2020-06-02: 2 g via INTRAVENOUS
  Filled 2020-06-02: qty 50

## 2020-06-02 MED ORDER — MELATONIN 5 MG PO TABS
10.0000 mg | ORAL_TABLET | Freq: Every evening | ORAL | Status: DC | PRN
  Administered 2020-06-02: 10 mg via ORAL
  Filled 2020-06-02: qty 2

## 2020-06-02 MED ORDER — INSULIN ASPART 100 UNIT/ML ~~LOC~~ SOLN
3.0000 [IU] | Freq: Three times a day (TID) | SUBCUTANEOUS | Status: DC
  Administered 2020-06-02 – 2020-06-04 (×6): 3 [IU] via SUBCUTANEOUS

## 2020-06-02 NOTE — Progress Notes (Signed)
PROGRESS NOTE    Eduardo Murray  XBM:[id number] DOB: Oct 07, 1940 DOA: 05/30/2020 PCP: Deland Pretty, MD   Brief Narrative:  HPI per Dr. Zada Finders on 05/30/20 Eduardo Murray is a 79 y.o. male with medical history significant for type 2 diabetes, hypertension, and RBBB who presents to the ED for evaluation of shortness of breath.  Patient states he has been having 1 week of new shortness of breath.  He has had associated cough productive of clear sputum.  He says he has been having chills, sweats, and rigors.  Patient was initially seen in the ED on 05/26/2020 and found to have bilateral pleural effusions on CXR.  He was otherwise felt to be stable and discharged to home with new prescription of oral Lasix.  Patient states he has been taking Lasix without significant improvement.  He was seen by his PCP earlier today who arranged for patient to return to the ED for further evaluation.  ED Course:  Initial vitals showed BP 157/89, pulse 108, RR 19, temp 98.0 Fahrenheit, SPO2 initially 90% on room air.  Patient placed on 2 L supplemental O2 via [place] with subsequent SPO2 >96%.  Labs show WBC 14.6, hemoglobin 13.6, platelets 372,000, sodium 138, potassium 3.9, bicarb 27, BUN 21, creatinine 1.09, serum glucose 201.  SARS-CoV-2 PCR is negative.  Influenza A/B PCR negative.  Portable chest x-ray showed bilateral effusions left greater than right with increased in left effusion size.  CT chest with contrast showed moderate loculated left pleural effusion with trace to small volume right pleural effusion.  Bilateral lower lobe passive atelectasis also seen.  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  Patient was started on IV ceftriaxone and azithromycin and the hospitalist service was consulted today for further evaluation and management.  **Interim History Patient underwent a Left sided Chest Tube Placement on 05/31/20 and had 700 mL Fluid out. Cx's are still pending but show NGTD at 2 Days. Feels  as if respiratory status is improving but had a significant cough yesterday that is improving. Off of Supplemental O2 today.   Assessment & Plan:   Principal Problem:   Sepsis due to pneumonia [facility]) Active Problems:   Type 2 diabetes mellitus without complications (HCC)   Loculated pleural effusion   Hypertension associated with diabetes ([place])  Sepsis due to Community-acquired pneumonia with left-sided loculated pleural effusion, Acute Hypoxic Respiratory Failure -On Admission Patient presented with leukocytosis 14.6, tachycardia up to 112, and tachypnea with RR up to 30.  CT shows left-sided loculated effusion suspicious for pneumonia.  SPO2 90% on room air, improved on 2 L O2 via [place].  Covid is negative.  He has been started on empiric antibiotics. -Patient was started on broad-spectrum antibiotics with ceftriaxone and azithromycin and will continue.   -WBC had improved to 10.1 but is now worsening and went to 10.8 -> 12.3 -Cultures are pending but Drain Cx showed NGTD at 2 Days; Blood Cx x2 showed NGTD at 1 Day sttill -C/w Guaifenesin and will change from 600 mg po BID to 1200 mg po BID -Check Legionella Pneumophila Serogp 1 Ur Ag and Strep Pneumoniae Urinary Ag -Thoracic surgery consulted and they recommending IR placing a pigtail catheter, discussed with IR and Chest Tube was placed yesterday and revealed 700 mL of Serous Fluid. Now had 250 mL of serous Fluid in the Atrium; Cardiothoracic Surgery recommending to keep in place as there has been no Air Leak and they have no plans for Chest Tube on the  Right -Thoracic Surgery Does not believe this is an Empyema -SpO2: 94 % O2 Flow Rate (L/min): 3 L/min; Not on Supplemental O2 via [place] today -Repeat CXR today showed "Interval decrease in basilar left pleural effusion status post chest tube placement. No pleural gas visible. Otherwise stable exam."; Right pleural effusion is stable and small  DM2 with Associated Neuropathy -Continue reduced  Lantus at 15 units sq qDaily and Sensitive Novolog SSI AC -Will add 3 units of Novolog TIDwm -Continue to hold Metformin -HbA1c was 7.1 -C/w Gabapentin 600 mg po qHS -Continue to Monitor Blood Sugars Carefully; CBG's ranging from 175-391  Essential Hypertension -Continue Lisinopril 40 mg po daily and Triamterene-HCTZ 37.5-25 mg po 1 tab Daily  -Last BP was 109/71 -Continue to Monitor BP per Protocol   Chronic Diastolic Grade 1 Diastolic Dysfunction -ECHOCardiogram showed "Left ventricular diastolic parameters are consistent with Grade I diastolic dysfunction (impaired relaxation)." -Currently appears Euvolemic -Continue to Monitor S/Sx of Volume Overload -Repeat CXR this AM showed "Stable chest tube positioning on the left. Small pleural effusions bilaterally with a loculated component to left pleural effusion in the left apex region. Bibasilar atelectasis. No new opacity evident. Stable cardiac silhouette. 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 K+ went from 3.4 -> 3.8 -> 4.2 -Continue to Monitor and Replete as Necessary -Repeat CMP in the AM  Hypomagnesemia -Patient's Mag Level this AM was 1.8 -Replete with IV Mag Sulfate 2 grams -Continue to Monitor and Replete as Necessary -Repeat Mag Level in  the AM   Leukocytosis, slightly worsening -Patient's WBC went from 10.1 -> 10.8 -> 12.3; In the setting of Above -Patient is Afebrile  -Continue to Monitor and Trend -Repeat CBC in the AM   DVT prophylaxis: Enoxaparin 40 mg sq q24h Code Status: FULL CODE Family Communication: Discussed with Wife at bedside  Disposition Plan: Pending further clinical improvement and clearance by IR and Thoracic Surgery  Status is: Inpatient  Remains inpatient appropriate because:Unsafe d/c plan, IV treatments appropriate due to intensity of illness or inability to take PO and Inpatient level of care appropriate due to severity of illness   Dispo: The patient is from: Home              Anticipated d/c is to: TBD              Anticipated d/c date is: 2 days              Patient currently is not medically stable to d/c.  Consultants:   Cardiothoracic Surgery Dr. Lanelle Bal  IR Dr. Sandi Mariscal   Procedures:  Technically successful CT guided placed of a 14 Fr drainage catheter placement into the left pleural space yielding 700 cc of serous pleural fluid done by Dr. Sandi Mariscal.    ECHOCARDIOGRAM IMPRESSIONS    1. Left ventricular ejection fraction,  by estimation, is 60 to 65%. The  left ventricle has normal function. The left ventricle has no regional  wall motion abnormalities. There is moderate asymmetric left ventricular  hypertrophy of the basal-septal  segment. Left ventricular diastolic parameters are consistent with Grade I  diastolic dysfunction (impaired relaxation).  2. Right ventricular systolic function is normal. The right ventricular  size is normal.  3. The mitral valve is normal in structure. No evidence of mitral valve  regurgitation. No evidence of mitral stenosis.  4. The aortic valve is normal in structure. Aortic valve regurgitation is  not visualized. No aortic stenosis is present.  5. The inferior vena cava is normal in size with greater than 50%  respiratory  variability, suggesting right atrial pressure of 3 mmHg.   FINDINGS  Left Ventricle: Left ventricular ejection fraction, by estimation, is 60  to 65%. The left ventricle has normal function. The left ventricle has no  regional wall motion abnormalities. The left ventricular internal cavity  size was normal in size. There is  moderate asymmetric left ventricular hypertrophy of the basal-septal  segment. Left ventricular diastolic parameters are consistent with Grade I  diastolic dysfunction (impaired relaxation). Indeterminate filling  pressures.   Right Ventricle: The right ventricular size is normal. No increase in  right ventricular wall thickness. Right ventricular systolic function is  normal.   Left Atrium: Left atrial size was normal in size.   Right Atrium: Right atrial size was normal in size.   Pericardium: There is no evidence of pericardial effusion.   Mitral Valve: The mitral valve is normal in structure. No evidence of  mitral valve regurgitation. No evidence of mitral valve stenosis.   Tricuspid Valve: The tricuspid valve is normal in structure. Tricuspid  valve regurgitation is not demonstrated. No evidence of tricuspid  stenosis.   Aortic Valve: The aortic valve is normal in structure. Aortic valve  regurgitation is not visualized. No aortic stenosis is present.   Pulmonic Valve: The pulmonic valve was normal in structure. Pulmonic valve  regurgitation is not visualized. No evidence of pulmonic stenosis.   Aorta: The aortic root is normal in size and structure.   Venous: The inferior vena cava is normal in size with greater than 50%  respiratory variability, suggesting right atrial pressure of 3 mmHg.   IAS/Shunts: No atrial level shunt detected by color flow Doppler.     LEFT VENTRICLE  PLAX 2D  LVIDd:     3.80 cm Diastology  LVIDs:     2.60 cm LV e' medial:  5.11 cm/s  LV PW:     1.00 cm LV E/e' medial: 12.9  LV IVS:    1.40  cm LV e' lateral:  5.33 cm/s  LVOT diam:   2.10 cm LV E/e' lateral: 12.4  LV SV:     54  LV SV Index:  27  LVOT Area:   3.46 cm     RIGHT VENTRICLE  RV S prime:   7.51 cm/s  TAPSE (M-mode): 1.7 cm   LEFT ATRIUM       Index    RIGHT ATRIUM      Index  LA diam:    2.90 cm 1.48 cm/m RA Area:   14.40 cm  LA Vol (A2C):  33.2 ml 16.94 ml/m RA Volume:  32.60 ml 16.64 ml/m  LA Vol (A4C):  23.5 ml 11.99 ml/m  LA Biplane Vol: 27.6 ml 14.09 ml/m  AORTIC VALVE  LVOT Vmax:  89.50 cm/s  LVOT  Vmean: 65.300 cm/s  LVOT VTI:  0.155 m    AORTA  Ao Root diam: 3.40 cm   MITRAL VALVE  MV Area (PHT): 1.62 cm  SHUNTS  MV Decel Time: 467 msec  Systemic VTI: 0.16 m  MV E velocity: 66.10 cm/s Systemic Diam: 2.10 cm  MV A velocity: 98.50 cm/s  MV E/A ratio: 0.67   Antimicrobials:  Anti-infectives (From admission, onward)   Start     Dose/Rate Route Frequency Ordered Stop   06/01/20 1600  azithromycin (ZITHROMAX) tablet 500 mg        500 mg Oral Daily 06/01/20 1456 06/04/20 0959   05/31/20 2000  azithromycin (ZITHROMAX) 500 mg in sodium chloride 0.9 % 250 mL IVPB  Status:  Discontinued        500 mg 250 mL/hr over 60 Minutes Intravenous Every 24 hours 05/30/20 2126 06/01/20 1456   05/31/20 0800  cefTRIAXone (ROCEPHIN) 2 g in sodium chloride 0.9 % 100 mL IVPB        2 g 200 mL/hr over 30 Minutes Intravenous Every 24 hours 05/30/20 2126 06/05/20 0759   05/30/20 2000  cefTRIAXone (ROCEPHIN) 1 g in sodium chloride 0.9 % 100 mL IVPB        1 g 200 mL/hr over 30 Minutes Intravenous  Once 05/30/20 1949 05/30/20 2039   05/30/20 2000  azithromycin (ZITHROMAX) tablet 500 mg        500 mg Oral  Once 05/30/20 1949 05/30/20 2002       Subjective: Seen and examined at bedside and his respiratory status is a little bit better but is not as short of breath today.  Denies any chest pain, lightheadedness or dizziness.  No nausea or vomiting.  Was  talking on the phone this morning without issues.  Off of supplemental oxygen nasal cannula..   Objective: Vitals:   06/01/20 0738 06/01/20 1600 06/01/20 2122 06/02/20 0740  BP: 109/61 (!) 96/54 (!) 107/54 109/71  Pulse: 81 93 90 91  Resp: 16 16 18 16   Temp: 98.1 F (36.7 C)  97.9 F (36.6 C)   TempSrc:   Oral   SpO2: 94% 93% 94% 94%  Weight:      Height:        Intake/Output Summary (Last 24 hours) at 06/02/2020 1322 Last data filed at 06/02/2020 1100 Gross per 24 hour  Intake 3 ml  Output 1530 ml  Net -1527 ml   Filed Weights   05/31/20 0914 06/01/20 0547 06/01/20 0647  Weight: 80.1 kg 80.3 kg 82.1 kg   Examination: Physical Exam:  Constitutional: WN/WD slightly overweight Caucasian male in NAD and appears calm and comfortable today Eyes: Lids and conjunctivae normal, sclerae anicteric  ENMT: External Ears, Nose appear normal. Grossly normal hearing. Neck: Appears normal, supple, no cervical masses, normal ROM, no appreciable thyromegaly; no JVD Respiratory: Diminished to auscultation bilaterally at the bases and has coarse breath sounds and some crackles and mild rhonchi; No appreciable wheezing. Normal respiratory effort and patient is not tachypenic. No accessory muscle use. Off of supplemental O2 via [place] and has a Left Sided Chest Tube. Cardiovascular: RRR, no murmurs / rubs / gallops. S1 and S2 auscultated. No extremity edema. Abdomen: Soft, non-tender, non-distended. Bowel sounds positive x4.  GU: Deferred. Musculoskeletal: No clubbing / cyanosis of digits/nails. No joint deformity upper and lower extremities.  Skin: No rashes, lesions, ulcers on a limited skin evaluation. No induration; Warm and dry.  Neurologic: CN 2-12 grossly intact with no focal  deficits. Romberg sign and cerebellar reflexes not assessed.  Psychiatric: Normal judgment and insight. Alert and oriented x 3. Normal mood and appropriate affect.   Data Reviewed: I have personally reviewed following  labs and imaging studies  CBC: Recent Labs  Lab 05/26/20 1645 05/30/20 1551 05/31/20 0506 06/01/20 0240 06/02/20 0350  WBC 13.0* 14.6* 10.1 10.8* 12.3*  NEUTROABS 9.4* 11.6*  --   --  9.0*  HGB 13.2 13.6 12.7* 12.3* 12.8*  HCT 39.1 42.8 39.5 38.7* 39.0  MCV 88.3 89.7 89.8 90.4 89.9  PLT 404* 372 280 329 378   Basic Metabolic Panel: Recent Labs  Lab 05/26/20 1645 05/30/20 1551 05/31/20 0506 06/01/20 0240 06/02/20 0350  NA 133* 138 138 133* 132*  K 3.8 3.9 3.4* 3.9 4.2  CL 100 97* 99 95* 97*  CO2 19* 27 28 28 26   GLUCOSE 204* 201* 91 219* 202*  BUN 30* 21 21 29* 34*  CREATININE 1.30* 1.09 1.00 1.16 1.11  CALCIUM 8.6* 8.7* 8.5* 8.6* 9.0  MG  --   --   --  1.5* 1.8  PHOS  --   --   --   --  3.5   GFR: Estimated Creatinine Clearance: 54.8 mL/min (by C-G formula based on SCr of 1.11 mg/dL). Liver Function Tests: Recent Labs  Lab 05/26/20 1645 06/01/20 0240 06/02/20 0350  AST 30 16 21   ALT 36 21 24  ALKPHOS 60 47 47  BILITOT 0.7 0.7 0.8  PROT 6.6 6.1* 6.5  ALBUMIN 2.5* 2.2* 2.2*   No results for input(s): LIPASE, AMYLASE in the last 168 hours. No results for input(s): AMMONIA in the last 168 hours. Coagulation Profile: No results for input(s): INR, PROTIME in the last 168 hours. Cardiac Enzymes: No results for input(s): CKTOTAL, CKMB, CKMBINDEX, TROPONINI in the last 168 hours. BNP (last 3 results) No results for input(s): PROBNP in the last 8760 hours. HbA1C: Recent Labs    05/31/20 0506  HGBA1C 7.1*   CBG: Recent Labs  Lab 06/01/20 1124 06/01/20 1636 06/01/20 2117 06/02/20 0740 06/02/20 1214  GLUCAP 309* 220* 302* 192* 391*   Lipid Profile: No results for input(s): CHOL, HDL, LDLCALC, TRIG, CHOLHDL, LDLDIRECT in the last 72 hours. Thyroid Function Tests: No results for input(s): TSH, T4TOTAL, FREET4, T3FREE, THYROIDAB in the last 72 hours. Anemia Panel: No results for input(s): VITAMINB12, FOLATE, FERRITIN, TIBC, IRON, RETICCTPCT in the last  72 hours. Sepsis Labs: No results for input(s): PROCALCITON, LATICACIDVEN in the last 168 hours.  Recent Results (from the past 240 hour(s))  Respiratory Panel by RT PCR (Flu A&B, Covid) - Nasopharyngeal Swab     Status: None   Collection Time: 05/26/20 10:40 PM   Specimen: Nasopharyngeal Swab  Result Value Ref Range Status   SARS Coronavirus 2 by RT PCR NEGATIVE NEGATIVE Final    Comment: (NOTE) SARS-CoV-2 target nucleic acids are NOT DETECTED.  The SARS-CoV-2 RNA is generally detectable in upper respiratoy specimens during the acute phase of infection. The lowest concentration of SARS-CoV-2 viral copies this assay can detect is 131 copies/mL. A negative result does not preclude SARS-Cov-2 infection and should not be used as the sole basis for treatment or other patient management decisions. A negative result may occur with  improper specimen collection/handling, submission of specimen other than nasopharyngeal swab, presence of viral mutation(s) within the areas targeted by this assay, and inadequate number of viral copies (<131 copies/mL). A negative result must be combined with clinical observations, patient history, and  epidemiological information. The expected result is Negative.  Fact Sheet for Patients:  PinkCheek.be  Fact Sheet for Healthcare Providers:  GravelBags.it  This test is no t yet approved or cleared by the [place] FDA and  has been authorized for detection and/or diagnosis of SARS-CoV-2 by FDA under an Emergency Use Authorization (EUA). This EUA will remain  in effect (meaning this test can be used) for the duration of the COVID-19 declaration under Section 564(b)(1) of the Act, 21 U.S.C. section 360bbb-3(b)(1), unless the authorization is terminated or revoked sooner.     Influenza A by PCR NEGATIVE NEGATIVE Final   Influenza B by PCR NEGATIVE NEGATIVE Final    Comment: (NOTE) The Xpert  Xpress SARS-CoV-2/FLU/RSV assay is intended as an aid in  the diagnosis of influenza from Nasopharyngeal swab specimens and  should not be used as a sole basis for treatment. Nasal washings and  aspirates are unacceptable for Xpert Xpress SARS-CoV-2/FLU/RSV  testing.  Fact Sheet for Patients: PinkCheek.be  Fact Sheet for Healthcare Providers: GravelBags.it  This test is not yet approved or cleared by the [place] FDA and  has been authorized for detection and/or diagnosis of SARS-CoV-2 by  FDA under an Emergency Use Authorization (EUA). This EUA will remain  in effect (meaning this test can be used) for the duration of the  Covid-19 declaration under Section 564(b)(1) of the Act, 21  U.S.C. section 360bbb-3(b)(1), unless the authorization is  terminated or revoked. Performed at [facility], [place] [address] [place], [place] [address]   Respiratory Panel by RT PCR (Flu A&B, Covid) - Nasopharyngeal Swab     Status: None   Collection Time: 05/30/20  3:53 PM   Specimen: Nasopharyngeal Swab  Result Value Ref Range Status   SARS Coronavirus 2 by RT PCR NEGATIVE NEGATIVE Final    Comment: (NOTE) SARS-CoV-2 target nucleic acids are NOT DETECTED.  The SARS-CoV-2 RNA is generally detectable in upper respiratoy specimens during the acute phase of infection. The lowest concentration of SARS-CoV-2 viral copies this assay can detect is 131 copies/mL. A negative result does not preclude SARS-Cov-2 infection and should not be used as the sole basis for treatment or other patient management decisions. A negative result may occur with  improper specimen collection/handling, submission of specimen other than nasopharyngeal swab, presence of viral mutation(s) within the areas targeted by this assay, and inadequate number of viral copies (<131 copies/mL). A negative result must be combined with clinical observations, patient  history, and epidemiological information. The expected result is Negative.  Fact Sheet for Patients:  PinkCheek.be  Fact Sheet for Healthcare Providers:  GravelBags.it  This test is no t yet approved or cleared by the [place] FDA and  has been authorized for detection and/or diagnosis of SARS-CoV-2 by FDA under an Emergency Use Authorization (EUA). This EUA will remain  in effect (meaning this test can be used) for the duration of the COVID-19 declaration under Section 564(b)(1) of the Act, 21 U.S.C. section 360bbb-3(b)(1), unless the authorization is terminated or revoked sooner.     Influenza A by PCR NEGATIVE NEGATIVE Final   Influenza B by PCR NEGATIVE NEGATIVE Final    Comment: (NOTE) The Xpert Xpress SARS-CoV-2/FLU/RSV assay is intended as an aid in  the diagnosis of influenza from Nasopharyngeal swab specimens and  should not be used as a sole basis for treatment. Nasal washings and  aspirates are unacceptable for Xpert Xpress SARS-CoV-2/FLU/RSV  testing.  Fact Sheet for Patients: PinkCheek.be  Fact Sheet for Healthcare Providers: GravelBags.it  This test is not yet approved or cleared by the [place] FDA and  has been authorized for detection and/or diagnosis of SARS-CoV-2 by  FDA under an Emergency Use Authorization (EUA). This EUA will remain  in effect (meaning this test can be used) for the duration of the  Covid-19 declaration under Section 564(b)(1) of the Act, 21  U.S.C. section 360bbb-3(b)(1), unless the authorization is  terminated or revoked. Performed at [facility], [place] [address], [place], [place] [address]   Culture, blood (Routine X 2) w Reflex to ID Panel     Status: None (Preliminary result)   Collection Time: 05/31/20  5:06 AM   Specimen: BLOOD LEFT ARM  Result Value Ref Range Status   Specimen Description BLOOD  LEFT ARM  Final   Special Requests   Final    BOTTLES DRAWN AEROBIC AND ANAEROBIC Blood Culture adequate volume   Culture   Final    NO GROWTH 1 DAY Performed at [facility], [place] [address], [place], [place] [address]    Report Status PENDING  Incomplete  Culture, blood (Routine X 2) w Reflex to ID Panel     Status: None (Preliminary result)   Collection Time: 05/31/20  5:06 AM   Specimen: BLOOD RIGHT HAND  Result Value Ref Range Status   Specimen Description BLOOD RIGHT HAND  Final   Special Requests   Final    BOTTLES DRAWN AEROBIC ONLY Blood Culture adequate volume   Culture   Final    NO GROWTH 1 DAY Performed at [facility], [place] [address] [place], [place] [address]    Report Status PENDING  Incomplete  Aerobic/Anaerobic Culture (surgical/deep wound)     Status: None (Preliminary result)   Collection Time: 05/31/20  5:09 PM   Specimen: Abscess  Result Value Ref Range Status   Specimen Description ABSCESS  Final   Special Requests DRAIN  Final   Gram Stain   Final    RARE WBC PRESENT, PREDOMINANTLY PMN NO ORGANISMS SEEN    Culture   Final    NO GROWTH 2 DAYS Performed at [facility], [place] [address], [place], [place] [address]    Report Status PENDING  Incomplete     RN Pressure Injury Documentation:     Estimated body mass index is 26.73 kg/m as calculated from the following:   Height as of this encounter: 5\' 9"  (1.753 m).   Weight as of this encounter: 82.1 kg.  Malnutrition Type:  Nutrition Problem: Increased nutrient needs Etiology: acute illness (sepsis 2/2 CAP)   Malnutrition Characteristics:  Signs/Symptoms: estimated needs   Nutrition Interventions:  Interventions: MVI, Premier Protein   Radiology Studies: DG CHEST PORT 1 VIEW  Result Date: 06/02/2020 CLINICAL DATA:  Pleural effusions EXAM: PORTABLE CHEST 1 VIEW COMPARISON:  June 01, 2020 chest radiograph and chest CT May 30, 2020 FINDINGS: Chest tube on the left  inferiorly, unchanged in position. No pneumothorax. Small pleural effusions bilaterally appear essentially stable. Note that there is opacity in the left apex which likely represents a degree of loculated pleural effusion in this area based on CT findings. There is bibasilar atelectasis. No consolidation. Heart size and pulmonary vascularity are normal. No adenopathy. There is aortic atherosclerosis. IMPRESSION: Stable chest tube positioning on the left. Small pleural effusions bilaterally with a loculated component to left pleural effusion in the left apex region. Bibasilar atelectasis. No new opacity  evident. Stable cardiac silhouette. Aortic Atherosclerosis (ICD10-I70.0). Electronically Signed   By: Lowella Grip III M.D.   On: 06/02/2020 08:11   DG Chest Port 1 View  Result Date: 06/01/2020 CLINICAL DATA:  Chest tube placement. EXAM: PORTABLE CHEST 1 VIEW COMPARISON:  05/30/2020 FINDINGS: New left pleural drain without evidence of left pneumothorax. Left pleural effusion has decreased in the interval. Loculated component in the left apex is similar. Small right pleural effusion persists. Residual basilar collapse/consolidative opacity without substantial change. The cardio pericardial silhouette is enlarged. The visualized bony structures of the thorax show no acute abnormality. IMPRESSION: Interval decrease in basilar left pleural effusion status post chest tube placement. No pleural gas visible. Otherwise stable exam. Electronically Signed   By: Misty Stanley M.D.   On: 06/01/2020 06:52   CT IMAGE GUIDED DRAINAGE BY PERCUTANEOUS CATHETER  Result Date: 06/01/2020 INDICATION: Concern for empyema and loculated left-sided pleural effusion. Please perform CT-guided chest tube for infection source control purposes. EXAM: CT IMAGE GUIDED DRAINAGE BY PERCUTANEOUS CATHETER COMPARISON:  Chest CT-05/30/2020; chest radiograph-05/30/2020 MEDICATIONS: The patient is currently admitted to the hospital and  receiving intravenous antibiotics. The antibiotics were administered within an appropriate time frame prior to the initiation of the procedure. ANESTHESIA/SEDATION: Moderate (conscious) sedation was employed during this procedure. A total of Versed 1.5 mg and Fentanyl 75 mcg was administered intravenously. Moderate Sedation Time: 19 minutes. The patient's level of consciousness and vital signs were monitored continuously by radiology nursing throughout the procedure under my direct supervision. CONTRAST:  None COMPLICATIONS: None immediate. PROCEDURE: Informed written consent was obtained from the patient after a discussion of the risks, benefits and alternatives to treatment. The patient was placed supine, slightly RPO on the CT gantry and a pre procedural CT was performed re-demonstrating the known moderate to large size likely partially loculated left-sided pleural effusion. The procedure was planned. A timeout was performed prior to the initiation of the procedure. The skin overlying the posteroinferior lateral aspect of the left chest was prepped and draped in the usual sterile fashion. The overlying soft tissues were anesthetized with 1% lidocaine with epinephrine. Appropriate trajectory was planned with the use of a 22 gauge spinal needle. An 18 gauge trocar needle was advanced into the abscess/fluid collection and a short Amplatz super stiff wire was coiled within the collection. Appropriate positioning was confirmed with a limited CT scan. The tract was serially dilated allowing placement of a 14 French all-purpose drainage catheter. Appropriate positioning was confirmed with a limited postprocedural CT scan. Approximately 700 cc of serous pleural fluid was aspirated following drainage catheter placement. The tube was connected to a pleura vac device and sutured in place. A dressing was placed. The patient tolerated the procedure well without immediate post procedural complication. IMPRESSION: Successful CT  guided placement of a 60 French all purpose drain catheter into the left pleural space with aspiration of 700 cc of serous pleural fluid. Samples were sent to the laboratory as requested by the ordering clinical team. Electronically Signed   By: Sandi Mariscal M.D.   On: 06/01/2020 07:57   Scheduled Meds: . azithromycin  500 mg Oral Daily  . enoxaparin (LOVENOX) injection  40 mg Subcutaneous Q24H  . gabapentin  600 mg Oral QHS  . guaiFENesin  1,200 mg Oral BID  . insulin aspart  0-9 Units Subcutaneous TID WC  . insulin aspart  3 Units Subcutaneous TID WC  . insulin glargine  15 Units Subcutaneous QHS  . lisinopril  40 mg  Oral Daily  . multivitamin with minerals  1 tablet Oral Daily  . pantoprazole  40 mg Oral Daily  . Ensure Max Protein  11 oz Oral BID  . sodium chloride flush  3 mL Intravenous Q12H  . triamterene-hydrochlorothiazide  1 tablet Oral Daily   Continuous Infusions: . cefTRIAXone (ROCEPHIN)  IV 2 g (06/01/20 0828)    LOS: 3 days   Kerney Elbe, DO [facility] PAGER is on [place]  If 7PM-7AM, please contact night-coverage [email or web address]

## 2020-06-02 NOTE — Progress Notes (Signed)
Inpatient Diabetes Program Recommendations  AACE/ADA: New Consensus Statement on Inpatient Glycemic Control (2015)  Target Ranges:  Prepandial:   less than 140 mg/dL      Peak postprandial:   less than 180 mg/dL (1-2 hours)      Critically ill patients:  140 - 180 mg/dL   Lab Results  Component Value Date   GLUCAP 391 (H) 06/02/2020   HGBA1C 7.1 (H) 05/31/2020    Review of Glycemic Control Results for Eduardo Murray, Eduardo Murray (MRN [id number]) as of 06/02/2020 12:20  Ref. Range 06/01/2020 16:36 06/01/2020 21:17 06/02/2020 07:40 06/02/2020 12:14  Glucose-Capillary Latest Ref Range: 70 - 99 mg/dL 220 (H) 302 (H) 192 (H) 391 (H)   Diabetes history: Type 2 DM Outpatient Diabetes medications: Trulicity 1.5 mg Q Sunday, Toujeo 30 units QD, Metformin 1000 mg BID Current orders for Inpatient glycemic control: Novolog 0-9 units TID, Lantus 15 units QHS Ensure Max BID  Inpatient Diabetes Program Recommendations:    Consider adding Novolog 3 units TID (assuming patient is consuming >50% of meals) and would change diet to carb modified.   Thanks, Bronson Curb, MSN, RNC-OB Diabetes Coordinator [phone] (8a-5p)

## 2020-06-02 NOTE — Progress Notes (Addendum)
[address]       [place] [address]             [phone]         Subjective: Minor SOB  Objective: Vital signs in last 24 hours: Temp:  [97.9 F (36.6 C)-98.1 F (36.7 C)] 97.9 F (36.6 C) (10/06 2122) Pulse Rate:  [81-93] 90 (10/06 2122) Cardiac Rhythm: Bundle branch block;Heart block (10/07 0703) Resp:  [16-18] 18 (10/06 2122) BP: (96-109)/(54-61) 107/54 (10/06 2122) SpO2:  [93 %-94 %] 94 % (10/06 2122)  Hemodynamic parameters for last 24 hours:    Intake/Output from previous day: 10/06 0701 - 10/07 0700 In: -  Out: 1030 [Urine:780; Chest Tube:250] Intake/Output this shift: No intake/output data recorded.  General appearance: alert, cooperative and no distress Heart: regular rate and rhythm Lungs: mildly dim in bases Abdomen: benign  Lab Results: Recent Labs    06/01/20 0240 06/02/20 0350  WBC 10.8* 12.3*  HGB 12.3* 12.8*  HCT 38.7* 39.0  PLT 329 371   BMET:  Recent Labs    06/01/20 0240 06/02/20 0350  NA 133* 132*  K 3.9 4.2  CL 95* 97*  CO2 28 26  GLUCOSE 219* 202*  BUN 29* 34*  CREATININE 1.16 1.11  CALCIUM 8.6* 9.0    PT/INR: No results for input(s): LABPROT, INR in the last 72 hours. ABG    Component Value Date/Time   TCO2 26 11/24/2012 1640   CBG (last 3)  Recent Labs    06/01/20 1124 06/01/20 1636 06/01/20 2117  GLUCAP 309* 220* 302*    Meds Scheduled Meds: . azithromycin  500 mg Oral Daily  . enoxaparin (LOVENOX) injection  40 mg Subcutaneous Q24H  . gabapentin  600 mg Oral QHS  . guaiFENesin  1,200 mg Oral BID  . insulin aspart  0-9 Units Subcutaneous TID WC  . insulin glargine  15 Units Subcutaneous QHS  . lisinopril  40 mg Oral Daily  . multivitamin with minerals  1 tablet Oral Daily  . pantoprazole  40 mg Oral Daily  . Ensure Max Protein  11 oz Oral BID  . sodium chloride flush  3 mL Intravenous Q12H  . triamterene-hydrochlorothiazide  1 tablet Oral Daily   Continuous Infusions: .  cefTRIAXone (ROCEPHIN)  IV 2 g (06/01/20 0828)   PRN Meds:.acetaminophen **OR** acetaminophen, ondansetron (ZOFRAN) IV  Xrays DG Chest Port 1 View  Result Date: 06/01/2020 CLINICAL DATA:  Chest tube placement. EXAM: PORTABLE CHEST 1 VIEW COMPARISON:  05/30/2020 FINDINGS: New left pleural drain without evidence of left pneumothorax. Left pleural effusion has decreased in the interval. Loculated component in the left apex is similar. Small right pleural effusion persists. Residual basilar collapse/consolidative opacity without substantial change. The cardio pericardial silhouette is enlarged. The visualized bony structures of the thorax show no acute abnormality. IMPRESSION: Interval decrease in basilar left pleural effusion status post chest tube placement. No pleural gas visible. Otherwise stable exam. Electronically Signed   By: Misty Stanley M.D.   On: 06/01/2020 06:52   ECHOCARDIOGRAM COMPLETE  Result Date: 05/31/2020    ECHOCARDIOGRAM REPORT   Patient Name:   Eduardo Murray Date of Exam: 05/31/2020 Medical Rec #:  [id number]        Height:       69.0 in Accession #:    [id number]       Weight:       176.6 lb Date of Birth:  Mar 29, 1941  BSA:          1.960 m Patient Age:    79 years         BP:           140/78 mmHg Patient Gender: M                HR:           84 bpm. Exam Location:  Inpatient Procedure: 2D Echo, Color Doppler and Cardiac Doppler Indications:    R06.9 DOE  History:        Patient has no prior history of Echocardiogram examinations.                 Risk Factors:Hypertension and Diabetes.  Sonographer:    Raquel Sarna Senior RDCS Referring Phys: [id number] [place]  Sonographer Comments: Technically difficult study due to poor echo windows, possibly lung interference IMPRESSIONS  1. Left ventricular ejection fraction, by estimation, is 60 to 65%. The left ventricle has normal function. The left ventricle has no regional wall motion abnormalities. There is moderate asymmetric left  ventricular hypertrophy of the basal-septal segment. Left ventricular diastolic parameters are consistent with Grade I diastolic dysfunction (impaired relaxation).  2. Right ventricular systolic function is normal. The right ventricular size is normal.  3. The mitral valve is normal in structure. No evidence of mitral valve regurgitation. No evidence of mitral stenosis.  4. The aortic valve is normal in structure. Aortic valve regurgitation is not visualized. No aortic stenosis is present.  5. The inferior vena cava is normal in size with greater than 50% respiratory variability, suggesting right atrial pressure of 3 mmHg. FINDINGS  Left Ventricle: Left ventricular ejection fraction, by estimation, is 60 to 65%. The left ventricle has normal function. The left ventricle has no regional wall motion abnormalities. The left ventricular internal cavity size was normal in size. There is  moderate asymmetric left ventricular hypertrophy of the basal-septal segment. Left ventricular diastolic parameters are consistent with Grade I diastolic dysfunction (impaired relaxation). Indeterminate filling pressures. Right Ventricle: The right ventricular size is normal. No increase in right ventricular wall thickness. Right ventricular systolic function is normal. Left Atrium: Left atrial size was normal in size. Right Atrium: Right atrial size was normal in size. Pericardium: There is no evidence of pericardial effusion. Mitral Valve: The mitral valve is normal in structure. No evidence of mitral valve regurgitation. No evidence of mitral valve stenosis. Tricuspid Valve: The tricuspid valve is normal in structure. Tricuspid valve regurgitation is not demonstrated. No evidence of tricuspid stenosis. Aortic Valve: The aortic valve is normal in structure. Aortic valve regurgitation is not visualized. No aortic stenosis is present. Pulmonic Valve: The pulmonic valve was normal in structure. Pulmonic valve regurgitation is not  visualized. No evidence of pulmonic stenosis. Aorta: The aortic root is normal in size and structure. Venous: The inferior vena cava is normal in size with greater than 50% respiratory variability, suggesting right atrial pressure of 3 mmHg. IAS/Shunts: No atrial level shunt detected by color flow Doppler.  LEFT VENTRICLE PLAX 2D LVIDd:         3.80 cm  Diastology LVIDs:         2.60 cm  LV e' medial:    5.11 cm/s LV PW:         1.00 cm  LV E/e' medial:  12.9 LV IVS:        1.40 cm  LV e' lateral:   5.33 cm/s LVOT diam:  2.10 cm  LV E/e' lateral: 12.4 LV SV:         54 LV SV Index:   27 LVOT Area:     3.46 cm  RIGHT VENTRICLE RV S prime:     7.51 cm/s TAPSE (M-mode): 1.7 cm LEFT ATRIUM             Index       RIGHT ATRIUM           Index LA diam:        2.90 cm 1.48 cm/m  RA Area:     14.40 cm LA Vol (A2C):   33.2 ml 16.94 ml/m RA Volume:   32.60 ml  16.64 ml/m LA Vol (A4C):   23.5 ml 11.99 ml/m LA Biplane Vol: 27.6 ml 14.09 ml/m  AORTIC VALVE LVOT Vmax:   89.50 cm/s LVOT Vmean:  65.300 cm/s LVOT VTI:    0.155 m  AORTA Ao Root diam: 3.40 cm MITRAL VALVE MV Area (PHT): 1.62 cm    SHUNTS MV Decel Time: 467 msec    Systemic VTI:  0.16 m MV E velocity: 66.10 cm/s  Systemic Diam: 2.10 cm MV A velocity: 98.50 cm/s MV E/A ratio:  0.67 Skeet Latch MD Electronically signed by Skeet Latch MD Signature Date/Time: 05/31/2020/11:29:55 AM    Final    CT IMAGE GUIDED DRAINAGE BY PERCUTANEOUS CATHETER  Result Date: 06/01/2020 INDICATION: Concern for empyema and loculated left-sided pleural effusion. Please perform CT-guided chest tube for infection source control purposes. EXAM: CT IMAGE GUIDED DRAINAGE BY PERCUTANEOUS CATHETER COMPARISON:  Chest CT-05/30/2020; chest radiograph-05/30/2020 MEDICATIONS: The patient is currently admitted to the hospital and receiving intravenous antibiotics. The antibiotics were administered within an appropriate time frame prior to the initiation of the procedure.  ANESTHESIA/SEDATION: Moderate (conscious) sedation was employed during this procedure. A total of Versed 1.5 mg and Fentanyl 75 mcg was administered intravenously. Moderate Sedation Time: 19 minutes. The patient's level of consciousness and vital signs were monitored continuously by radiology nursing throughout the procedure under my direct supervision. CONTRAST:  None COMPLICATIONS: None immediate. PROCEDURE: Informed written consent was obtained from the patient after a discussion of the risks, benefits and alternatives to treatment. The patient was placed supine, slightly RPO on the CT gantry and a pre procedural CT was performed re-demonstrating the known moderate to large size likely partially loculated left-sided pleural effusion. The procedure was planned. A timeout was performed prior to the initiation of the procedure. The skin overlying the posteroinferior lateral aspect of the left chest was prepped and draped in the usual sterile fashion. The overlying soft tissues were anesthetized with 1% lidocaine with epinephrine. Appropriate trajectory was planned with the use of a 22 gauge spinal needle. An 18 gauge trocar needle was advanced into the abscess/fluid collection and a short Amplatz super stiff wire was coiled within the collection. Appropriate positioning was confirmed with a limited CT scan. The tract was serially dilated allowing placement of a 14 French all-purpose drainage catheter. Appropriate positioning was confirmed with a limited postprocedural CT scan. Approximately 700 cc of serous pleural fluid was aspirated following drainage catheter placement. The tube was connected to a pleura vac device and sutured in place. A dressing was placed. The patient tolerated the procedure well without immediate post procedural complication. IMPRESSION: Successful CT guided placement of a 59 French all purpose drain catheter into the left pleural space with aspiration of 700 cc of serous pleural fluid.  Samples were sent to the laboratory as  requested by the ordering clinical team. Electronically Signed   By: Sandi Mariscal M.D.   On: 06/01/2020 07:57    Results for orders placed or performed during the hospital encounter of 05/30/20  Respiratory Panel by RT PCR (Flu A&B, Covid) - Nasopharyngeal Swab     Status: None   Collection Time: 05/30/20  3:53 PM   Specimen: Nasopharyngeal Swab  Result Value Ref Range Status   SARS Coronavirus 2 by RT PCR NEGATIVE NEGATIVE Final    Comment: (NOTE) SARS-CoV-2 target nucleic acids are NOT DETECTED.  The SARS-CoV-2 RNA is generally detectable in upper respiratoy specimens during the acute phase of infection. The lowest concentration of SARS-CoV-2 viral copies this assay can detect is 131 copies/mL. A negative result does not preclude SARS-Cov-2 infection and should not be used as the sole basis for treatment or other patient management decisions. A negative result may occur with  improper specimen collection/handling, submission of specimen other than nasopharyngeal swab, presence of viral mutation(s) within the areas targeted by this assay, and inadequate number of viral copies (<131 copies/mL). A negative result must be combined with clinical observations, patient history, and epidemiological information. The expected result is Negative.  Fact Sheet for Patients:  PinkCheek.be  Fact Sheet for Healthcare Providers:  GravelBags.it  This test is no t yet approved or cleared by the [place] FDA and  has been authorized for detection and/or diagnosis of SARS-CoV-2 by FDA under an Emergency Use Authorization (EUA). This EUA will remain  in effect (meaning this test can be used) for the duration of the COVID-19 declaration under Section 564(b)(1) of the Act, 21 U.S.C. section 360bbb-3(b)(1), unless the authorization is terminated or revoked sooner.     Influenza A by PCR NEGATIVE  NEGATIVE Final   Influenza B by PCR NEGATIVE NEGATIVE Final    Comment: (NOTE) The Xpert Xpress SARS-CoV-2/FLU/RSV assay is intended as an aid in  the diagnosis of influenza from Nasopharyngeal swab specimens and  should not be used as a sole basis for treatment. Nasal washings and  aspirates are unacceptable for Xpert Xpress SARS-CoV-2/FLU/RSV  testing.  Fact Sheet for Patients: PinkCheek.be  Fact Sheet for Healthcare Providers: GravelBags.it  This test is not yet approved or cleared by the [place] FDA and  has been authorized for detection and/or diagnosis of SARS-CoV-2 by  FDA under an Emergency Use Authorization (EUA). This EUA will remain  in effect (meaning this test can be used) for the duration of the  Covid-19 declaration under Section 564(b)(1) of the Act, 21  U.S.C. section 360bbb-3(b)(1), unless the authorization is  terminated or revoked. Performed at [facility], [place] [address], [place], [place] [address]   Culture, blood (Routine X 2) w Reflex to ID Panel     Status: None (Preliminary result)   Collection Time: 05/31/20  5:06 AM   Specimen: BLOOD LEFT ARM  Result Value Ref Range Status   Specimen Description BLOOD LEFT ARM  Final   Special Requests   Final    BOTTLES DRAWN AEROBIC AND ANAEROBIC Blood Culture adequate volume   Culture   Final    NO GROWTH 1 DAY Performed at [facility], [place] [address], [place], [place] [address]    Report Status PENDING  Incomplete  Culture, blood (Routine X 2) w Reflex to ID Panel     Status: None (Preliminary result)   Collection Time: 05/31/20  5:06 AM   Specimen: BLOOD RIGHT HAND  Result  Value Ref Range Status   Specimen Description BLOOD RIGHT HAND  Final   Special Requests   Final    BOTTLES DRAWN AEROBIC ONLY Blood Culture adequate volume   Culture   Final    NO GROWTH 1 DAY Performed at [facility], [address], [place],  [place] [address]    Report Status PENDING  Incomplete  Aerobic/Anaerobic Culture (surgical/deep wound)     Status: None (Preliminary result)   Collection Time: 05/31/20  5:09 PM   Specimen: Abscess  Result Value Ref Range Status   Specimen Description ABSCESS  Final   Special Requests DRAIN  Final   Gram Stain   Final    RARE WBC PRESENT, PREDOMINANTLY PMN NO ORGANISMS SEEN    Culture   Final    NO GROWTH < 24 HOURS Performed at [facility], [place] [address] [place], [place] [address]    Report Status PENDING  Incomplete     Assessment/Plan:  1 afeb, VSS sats good on RA 2 WBC up a little. Left shift- abx same 3 renal fxn stable 4 minor anemia 5 hypomagnesemia, minor 6 glucose poor control 7 CXR improved left effus, right is stable 8 CT- 250 cc serous, leave in place - does not appear to be empyema. No air leak 9 medical management as per primary service 10 no plans for tube on right , currently  LOS: 3 days    John Giovanni, [place]  Pager [phone] 10/Oct/2021  I have seen and examined Eduardo Murray and agree with the above assessment  and plan.  Grace Isaac MD Beeper [phone] Office [phone] 06/02/2020 8:45 AM

## 2020-06-03 ENCOUNTER — Inpatient Hospital Stay (HOSPITAL_COMMUNITY): Payer: Medicare HMO

## 2020-06-03 LAB — COMPREHENSIVE METABOLIC PANEL
ALT: 31 U/L (ref 0–44)
AST: 24 U/L (ref 15–41)
Albumin: 2.2 g/dL — ABNORMAL LOW (ref 3.5–5.0)
Alkaline Phosphatase: 44 U/L (ref 38–126)
Anion gap: 8 (ref 5–15)
BUN: 36 mg/dL — ABNORMAL HIGH (ref 8–23)
CO2: 26 mmol/L (ref 22–32)
Calcium: 9 mg/dL (ref 8.9–10.3)
Chloride: 97 mmol/L — ABNORMAL LOW (ref 98–111)
Creatinine, Ser: 1.42 mg/dL — ABNORMAL HIGH (ref 0.61–1.24)
GFR calc non Af Amer: 47 mL/min — ABNORMAL LOW (ref >60)
Glucose, Bld: 246 mg/dL — ABNORMAL HIGH (ref 70–99)
Potassium: 4.9 mmol/L (ref 3.5–5.1)
Sodium: 131 mmol/L — ABNORMAL LOW (ref 135–145)
Total Bilirubin: 0.7 mg/dL (ref 0.3–1.2)
Total Protein: 6.5 g/dL (ref 6.5–8.1)

## 2020-06-03 LAB — CBC WITH DIFFERENTIAL/PLATELET
Abs Immature Granulocytes: 0.11 10*3/uL — ABNORMAL HIGH (ref 0.00–0.07)
Basophils Absolute: 0.1 10*3/uL (ref 0.0–0.1)
Basophils Relative: 1 %
Eosinophils Absolute: 0.2 10*3/uL (ref 0.0–0.5)
Eosinophils Relative: 2 %
HCT: 39.1 % (ref 39.0–52.0)
Hemoglobin: 12.6 g/dL — ABNORMAL LOW (ref 13.0–17.0)
Immature Granulocytes: 1 %
Lymphocytes Relative: 16 %
Lymphs Abs: 1.9 10*3/uL (ref 0.7–4.0)
MCH: 28.6 pg (ref 26.0–34.0)
MCHC: 32.2 g/dL (ref 30.0–36.0)
MCV: 88.9 fL (ref 80.0–100.0)
Monocytes Absolute: 0.7 10*3/uL (ref 0.1–1.0)
Monocytes Relative: 6 %
Neutro Abs: 8.4 10*3/uL — ABNORMAL HIGH (ref 1.7–7.7)
Neutrophils Relative %: 74 %
Platelets: 389 10*3/uL (ref 150–400)
RBC: 4.4 MIL/uL (ref 4.22–5.81)
RDW: 12.9 % (ref 11.5–15.5)
WBC: 11.3 10*3/uL — ABNORMAL HIGH (ref 4.0–10.5)
nRBC: 0 % (ref 0.0–0.2)

## 2020-06-03 LAB — RETICULOCYTES
Immature Retic Fract: 6.7 % (ref 2.3–15.9)
RBC.: 4.34 MIL/uL (ref 4.22–5.81)
Retic Count, Absolute: 61.6 10*3/uL (ref 19.0–186.0)
Retic Ct Pct: 1.4 % (ref 0.4–3.1)

## 2020-06-03 LAB — FOLATE: Folate: 13.5 ng/mL (ref >5.9)

## 2020-06-03 LAB — VITAMIN B12: Vitamin B-12: 499 pg/mL (ref 180–914)

## 2020-06-03 LAB — FERRITIN: Ferritin: 627 ng/mL — ABNORMAL HIGH (ref 24–336)

## 2020-06-03 LAB — CREATININE, URINE, RANDOM: Creatinine, Urine: 136.49 mg/dL

## 2020-06-03 LAB — IRON AND TIBC
Iron: 42 ug/dL — ABNORMAL LOW (ref 45–182)
Saturation Ratios: 19 % (ref 17.9–39.5)
TIBC: 221 ug/dL — ABNORMAL LOW (ref 250–450)
UIBC: 179 ug/dL

## 2020-06-03 LAB — MAGNESIUM: Magnesium: 2 mg/dL (ref 1.7–2.4)

## 2020-06-03 LAB — SODIUM, URINE, RANDOM: Sodium, Ur: 64 mmol/L

## 2020-06-03 LAB — GLUCOSE, CAPILLARY
Glucose-Capillary: 205 mg/dL — ABNORMAL HIGH (ref 70–99)
Glucose-Capillary: 306 mg/dL — ABNORMAL HIGH (ref 70–99)
Glucose-Capillary: 236 mg/dL — ABNORMAL HIGH (ref 70–99)
Glucose-Capillary: 221 mg/dL — ABNORMAL HIGH (ref 70–99)

## 2020-06-03 LAB — OSMOLALITY, URINE: Osmolality, Ur: 547 mOsm/kg (ref 300–900)

## 2020-06-03 LAB — PHOSPHORUS: Phosphorus: 3.9 mg/dL (ref 2.5–4.6)

## 2020-06-03 MED ORDER — TRAZODONE HCL 100 MG PO TABS
100.0000 mg | ORAL_TABLET | Freq: Every evening | ORAL | Status: DC | PRN
  Administered 2020-06-03: 100 mg via ORAL
  Filled 2020-06-03: qty 1

## 2020-06-03 MED ORDER — SODIUM CHLORIDE 0.9 % IV SOLN: INTRAVENOUS | Status: AC

## 2020-06-03 MED ORDER — INSULIN GLARGINE 100 UNIT/ML ~~LOC~~ SOLN
18.0000 [IU] | Freq: Every day | SUBCUTANEOUS | Status: DC
  Administered 2020-06-03: 18 [IU] via SUBCUTANEOUS
  Filled 2020-06-03 (×2): qty 0.18

## 2020-06-03 NOTE — Progress Notes (Signed)
Contacted IR about removing chest tube as ordered by Jadene Pierini, [facility]. Paged IR PA Larene Beach, she will be coming to D/C pt's chest tube.      (spoke with Jadene Pierini, PA about ordered labs for pleural fluid written 10/5, cytology department had called earlier asking, order received to d/c labs).

## 2020-06-03 NOTE — Progress Notes (Signed)
Inpatient Diabetes Program Recommendations  AACE/ADA: New Consensus Statement on Inpatient Glycemic Control (2015)  Target Ranges:  Prepandial:   less than 140 mg/dL      Peak postprandial:   less than 180 mg/dL (1-2 hours)      Critically ill patients:  140 - 180 mg/dL   Lab Results  Component Value Date   GLUCAP 205 (H) 06/03/2020   HGBA1C 7.1 (H) 05/31/2020    Review of Glycemic Control Results for Eduardo Murray, Eduardo Murray (MRN [id number]) as of 06/03/2020 09:04  Ref. Range 06/02/2020 12:14 06/02/2020 16:39 06/02/2020 21:34 06/03/2020 07:51  Glucose-Capillary Latest Ref Range: 70 - 99 mg/dL 391 (H) 171 (H) 261 (H) 205 (H)   Diabetes history: Type 2 DM Outpatient Diabetes medications: Trulicity 1.5 mg Q Sunday, Toujeo 30 units QD, Metformin 1000 mg BID Current orders for Inpatient glycemic control: Novolog 0-9 units TID, Lantus 15 units QHS, Novolog 3 units TID Ensure Max BID  Inpatient Diabetes Program Recommendations:    Also, consider increasing Lantus to 18 units QHS.    Thanks, Bronson Curb, MSN, RNC-OB Diabetes Coordinator [phone] (8a-5p)

## 2020-06-03 NOTE — TOC Initial Note (Signed)
Transition of Care [facility]) - Initial/Assessment Note    Patient Details  Name: Eduardo Murray MRN: [id number] Date of Birth: 1941/07/25  Transition of Care [facility]) CM/SW Contact:    Eduardo Chars, LCSW Phone Number: 06/03/2020, 3:07 PM  Clinical Narrative:    CSW spoke with pt and wife regarding DC plan.  Permission given to speak with wife and [facility] and pt is agreeable to plan for [facility] services at DC.  Choice document provided, wife asks for "4 star agency."  Address and PCP verified.  Pt currently has walker, shower bench at home, 3n1 at home and no other equipment needs identified.  [place] set up with [facility] and pt informed.               Expected Discharge Plan: [place] Barriers to Discharge: Continued Medical Work up   Patient Goals and CMS Choice Patient states their goals for this hospitalization and ongoing recovery are:: "get around a little bit" CMS Medicare.gov Compare Post Acute Care list provided to:: Patient Choice offered to / list presented to : Patient  Expected Discharge Plan and Services Expected Discharge Plan: [place] Choice: [place] arrangements for the past 2 months: Single Family Home                 DME Arranged: N/A         HH Arranged: PT HH Agency: [facility] Date [place]: 06/03/20 Time HH Agency Contacted: 1500 Representative spoke with at [place]: Eduardo Murray  Prior Living Arrangements/Services Living arrangements for the past 2 months: [place] Lives with:: Spouse Patient language and need for interpreter reviewed:: Yes Do you feel safe going back to the place where you live?: Yes      Need for Family Participation in Patient Care: No (Comment) Care giver support system in place?: Yes (comment)   Criminal Activity/Legal Involvement Pertinent to Current Situation/Hospitalization: No - Comment as needed  Activities of Daily Living Home  Assistive Devices/Equipment: None ADL Screening (condition at time of admission) Patient's cognitive ability adequate to safely complete daily activities?: Yes Is the patient deaf or have difficulty hearing?: Yes Does the patient have difficulty seeing, even when wearing glasses/contacts?: No Does the patient have difficulty concentrating, remembering, or making decisions?: No Patient able to express need for assistance with ADLs?: Yes Does the patient have difficulty dressing or bathing?: No Independently performs ADLs?: Yes (appropriate for developmental age) Does the patient have difficulty walking or climbing stairs?: Yes Weakness of Legs: Both Weakness of Arms/Hands: None  Permission Sought/Granted Permission sought to share information with : Facility Sport and exercise psychologist, Family Supports Permission granted to share information with : Yes, Verbal Permission Granted  Share Information with NAME: wife Eduardo Murray  Permission granted to share info w AGENCY: HH        Emotional Assessment Appearance:: Appears stated age Attitude/Demeanor/Rapport: Engaged Affect (typically observed): Pleasant Orientation: : Oriented to Self, Oriented to Place, Oriented to  Time, Oriented to Situation Alcohol / Substance Use: Not Applicable Psych Involvement: No (comment)  Admission diagnosis:  Bilateral pleural effusion [J90] Loculated pleural effusion [J90] Community acquired pneumonia, unspecified laterality [J18.9] Patient Active Problem List   Diagnosis Date Noted   Loculated pleural effusion 05/30/2020   Sepsis due to pneumonia ([place]) 05/30/2020   Hypertension associated with diabetes ([place]) 05/30/2020   Primary localized osteoarthritis of left hip 03/24/2019  Pain in the chest 03/12/2014   Type 2 diabetes mellitus without complications ([place]) 83/38/2505   Sternal fracture 03/12/2014   Right bundle branch block 03/12/2014   PCP:  Deland Pretty, MD Pharmacy:   [id number] -  [place], [address] [place] [address] Phone: [phone] Fax: [phone]     Social Determinants of Health (SDOH) Interventions    Readmission Risk Interventions No flowsheet data found.

## 2020-06-03 NOTE — Care Management Important Message (Signed)
Important Message  Patient Details  Name: Eduardo Murray MRN: [id number] Date of Birth: [birth year]   Medicare Important Message Given:  Yes     Idamay Hosein 06/03/2020, 2:28 PM

## 2020-06-03 NOTE — Progress Notes (Addendum)
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 Currently pt minguard A with mobility and ADL @RW  level on RA with SpO2 remaining above 90, however only completed limited mobility for session. Pt fatigues easily with tasks and will benefit from acute OT to maximize functional level of independence, reduce risk of falls and educate on energy conservation strategies to facilitate safe DC home. Recommend follow up with [place]. Pt/wife in agreement.     06/03/20 1000  OT Visit Information  Last OT Received On 06/03/20  Assistance Needed +1  History of Present Illness 79 y.o. male with medical history significant for type 2 diabetes, hypertension, and RBBB who presented to the ED for evaluation of shortness of breath. Pt admitted with dx of sepsis due to PNA.  Precautions  Precautions Fall  Home Living  Family/patient expects to be discharged to: Private residence  Living Arrangements Spouse/significant other  Available Help at Discharge Family;Available 24 hours/day  Type of Home House  Home Access Stairs to enter  Entrance Stairs-Number of Steps 3  Entrance Stairs-Rails Can reach both;Right;Left  Home Layout One level  Print production planner Handicapped height  Bathroom Accessibility Yes  How Accessible Accessible via walker  [place] - 2 wheels;Cane - quad;Shower seat;Grab bars - tub/shower;Toilet riser  Prior Function  Level of Independence Independent  Communication  Communication HOH  Pain Assessment  Pain Assessment No/denies pain  Cognition  Arousal/Alertness Awake/alert  Behavior During Therapy WFL for tasks assessed/performed  Overall Cognitive Status Will further assess; slow processing and decreased anticipatory awareness  General Comments most likely at baseline  Upper Extremity Assessment  Upper Extremity  Assessment RUE deficits/detail  RUE Deficits / Details Apparent RTC deficits at baseline; compensates by pulling arm up with LUE  RUE Coordination decreased gross motor  Lower Extremity Assessment  Lower Extremity Assessment Defer to PT evaluation  Cervical / Trunk Assessment  Cervical / Trunk Assessment Kyphotic  ADL  Overall ADL's  Needs assistance/impaired  Grooming Min guard;Standing  Upper Body Bathing Set up;Sitting  Lower Body Bathing Min guard;Sit to/from stand  Upper Body Dressing  Set up;Sitting  Lower Body Dressing Min guard;Sit to/from Environmental education officer guard;Ambulation;RW;BSC (BSC over toilet)  Toileting- Clothing Manipulation and Hygiene Minimal assistance  Toileting - Clothing Manipulation Details (indicate cue type and reason) pt tried to sit on commode without adjusting clothing  Functional mobility during ADLs Min guard;Rolling walker;Cueing for safety  General ADL Comments affected by poor activity tolerance  Vision- History  Baseline Vision/History Wears glasses  Wears Glasses At all times  Bed Mobility  General bed mobility comments OOB in chair  Transfers  Overall transfer level Needs assistance  Equipment used Rolling walker (2 wheeled)  Transfers Sit to/from Stand  Sit to Stand Min guard  Balance  Overall balance assessment Needs assistance  Sitting balance-Leahy Scale Good  Standing balance-Leahy Scale Poor  Standing balance comment reliant on BUE support  General Comments  General comments (skin integrity, edema, etc.) wife (HOH) present for session; states she works during the day  OT - End of Session  Equipment Utilized During Treatment Gait belt;Rolling walker  Activity Tolerance Patient tolerated treatment well  Patient left Other (comment) (in bathroom; nsg made aware)  Nurse Communication Mobility status  OT Assessment  OT Recommendation/Assessment Patient needs continued OT Services  OT Visit Diagnosis  Unsteadiness on feet  (R26.81);Other abnormalities of gait and mobility (R26.89);Muscle weakness (generalized) (M62.81);Other symptoms and signs involving cognitive function  OT Problem List Decreased strength;Decreased activity tolerance;Impaired balance (sitting and/or standing);Decreased safety awareness;Decreased cognition;Decreased knowledge of use of DME or AE;Cardiopulmonary status limiting activity;Impaired UE functional use  OT Plan  OT Frequency (ACUTE ONLY) Min 2X/week  OT Treatment/Interventions (ACUTE ONLY) Self-care/ADL training;Therapeutic exercise;Energy conservation;DME and/or AE instruction;Therapeutic activities;Cognitive remediation/compensation;Patient/family education;Balance training  AM-PAC OT "6 Clicks" Daily Activity Outcome Measure (Version 2)  Help from another person eating meals? 4  Help from another person taking care of personal grooming? 3  Help from another person toileting, which includes using toliet, bedpan, or urinal? 3  Help from another person bathing (including washing, rinsing, drying)? 3  Help from another person to put on and taking off regular upper body clothing? 3  Help from another person to put on and taking off regular lower body clothing? 3  6 Click Score 19  OT Recommendation  Follow Up Recommendations Home health OT;Supervision - Intermittent  OT Equipment Other (comment) (Pt may benefit form rollator; will further assess)  Individuals Consulted  Consulted and Agree with Results and Recommendations Patient;Family member/caregiver  Family Member Consulted wife  Acute Rehab OT Goals  Patient Stated Goal to get back to mowing lawns  OT Goal Formulation With patient  Time For Goal Achievement 06/17/20  Potential to Achieve Goals Good  OT Time Calculation  OT Start Time (ACUTE ONLY) 1018  OT Stop Time (ACUTE ONLY) 1041  OT Time Calculation (min) 23 min  OT General Charges  $OT Visit 1 Visit  OT Evaluation  $OT Eval Moderate Complexity 1 Mod  OT Treatments   $Self Care/Home Management  8-22 mins  Written Expression  Dominant Hand Right  Maurie Boettcher, OT/L   Acute OT Clinical Specialist [place] Pager [phone] Office [phone]

## 2020-06-03 NOTE — Progress Notes (Signed)
Referring Physician(s): Dr. Servando Snare  Supervising Physician: Jacqulynn Cadet  Patient Status:  [facility] - In-pt  Chief Complaint: Empyema, loculated effusion  Subjective: Patient resting comfortably in bed. Excited to have chest tube removed.  Asking if he can go home soon.  No complaints.   Allergies: Patient has no known allergies.  Medications: Prior to Admission medications   Medication Sig Start Date End Date Taking? Authorizing Provider  acetaminophen (TYLENOL) 500 MG tablet Take 500 mg by mouth every 6 (six) hours as needed for fever.   Yes [provider]  aspirin EC 81 MG tablet Take 81 mg by mouth daily. Swallow whole.   Yes [provider]  baclofen (LIORESAL) 10 MG tablet Take 1 tablet (10 mg total) by mouth 3 (three) times daily. As needed for muscle spasm Patient taking differently: Take 10 mg by mouth daily as needed for muscle spasms.  03/24/19  Yes Marchia Bond, MD  Dulaglutide (TRULICITY) 1.5 HG/9.9ME SOPN Inject 1.5 mg into the skin every Sunday.   Yes [provider]  gabapentin (NEURONTIN) 300 MG capsule Take 600 mg by mouth at bedtime.   Yes [provider]  HYDROcodone-acetaminophen (NORCO) 10-325 MG tablet Take 1 tablet by mouth every 6 (six) hours as needed. Patient taking differently: Take 1 tablet by mouth every 6 (six) hours as needed for severe pain.  03/24/19  Yes Marchia Bond, MD  Insulin Glargine (TOUJEO MAX SOLOSTAR Letts) Inject 30 Units into the skin daily.    Yes [provider]  lisinopril (ZESTRIL) 40 MG tablet Take 40 mg by mouth daily.   Yes [provider]  metFORMIN (GLUCOPHAGE) 1000 MG tablet Take 1,000 mg by mouth 2 (two) times daily with a meal.   Yes [provider]  ondansetron (ZOFRAN) 4 MG tablet Take 1 tablet (4 mg total) by mouth every 8 (eight) hours as needed for nausea or vomiting. 03/24/19  Yes Marchia Bond, MD  pantoprazole (PROTONIX) 40 MG tablet Take 40 mg by  mouth daily.  02/17/14  Yes [provider]  traZODone (DESYREL) 50 MG tablet Take 100 mg by mouth at bedtime.   Yes [provider]  triamterene-hydrochlorothiazide (MAXZIDE-25) 37.5-25 MG tablet Take 1 tablet by mouth daily. 05/01/20  Yes [provider]  vitamin B-12 (CYANOCOBALAMIN) 1000 MCG tablet Take 1,000 mcg by mouth daily.   Yes [provider]  aspirin EC 325 MG tablet Take 1 tablet (325 mg total) by mouth 2 (two) times daily. Patient not taking: Reported on 05/30/2020 03/24/19   Marchia Bond, MD  sennosides-docusate sodium (SENOKOT-S) 8.6-50 MG tablet Take 2 tablets by mouth daily. Patient not taking: Reported on 05/30/2020 03/24/19   Marchia Bond, MD     Vital Signs: BP 116/69 (BP Location: Right Arm)   Pulse 95   Temp (!) 97.5 F (36.4 C)   Resp 16   Ht 5\' 9"  (1.753 m)   Wt 179 lb 3.7 oz (81.3 kg)   SpO2 95%   BMI 26.47 kg/m   Physical Exam  NAD, alert Chest: left-sided chest tube in place. Thin, serous fluid in Pleur-Vac. Site intact.   Imaging: DG Chest 2 View  Result Date: 06/03/2020 CLINICAL DATA:  Shortness of breath. EXAM: CHEST - 2 VIEW COMPARISON:  06/02/2020.  CT 05/30/2020. FINDINGS: Left chest tube in stable position. No pneumothorax. Heart size stable. Low lung volumes with bibasilar atelectasis. Small bilateral pleural effusions again noted these may have increased slightly from prior exam. IMPRESSION:  1. Left chest tube in stable position. No pneumothorax. 2. Low lung volumes with bibasilar atelectasis. Small bilateral pleural effusions again noted. These may have increased slightly from prior exam. Electronically Signed   By: Oakes   On: 06/03/2020 07:45   CT Chest W Contrast  Result Date: 05/30/2020 CLINICAL DATA:  Pneumonia, effusion or abscess suspected, xray done EXAM: CT CHEST WITH CONTRAST TECHNIQUE: Multidetector CT imaging of the chest was performed during intravenous contrast administration. CONTRAST:   16mL OMNIPAQUE IOHEXOL 300 MG/ML  SOLN COMPARISON:  Chest x-ray 05/30/2020. FINDINGS: Cardiovascular: Normal heart size. No significant pericardial effusion. 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 The thoracic aorta is normal in caliber. No atherosclerotic plaque of the thoracic aorta. Mild left anterior descending coronary artery calcifications. The main pulmonary artery is normal in caliber. No central or segmental pulmonary embolus. Mediastinum/Nodes: No enlarged mediastinal, hilar, or axillary lymph nodes. Thyroid gland, trachea, and esophagus demonstrate no significant findings. Lungs/Pleura: Incidentally noted azygos fissure. Bilateral lower lobe passive atelectasis that appears slightly hypodense and heterogeneous. Linear atelectasis versus scarring within the lungs. No definite focal consolidation. Calcified granuloma at the right base. Traction bronchiectasis within bilateral lower lobes. Mild bronchial wall thickening within bilateral lower lobes. No pulmonary mass within the aerated lungs. Moderate left pleural effusion that appears to be loculated at the apex (3:36. Trace to small volume right pleural effusion. No pneumothorax. Upper Abdomen: No acute abnormality. Musculoskeletal: No chest wall abnormality No suspicious lytic or blastic osseous lesions. Old healed sternal fracture. No acute displaced fracture. Multilevel degenerative changes of the spine. IMPRESSION: 1. Moderate loculated left pleural effusion. 2. Trace to small volume right pleural effusion. 3. Bilateral lower lobe passive atelectasis with underlying infection/inflammation not excluded. 4. 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 Aortic Atherosclerosis (ICD10-I70.0) - mild. Mild left anterior descending coronary artery calcification. Electronically Signed   By: Iven Finn M.D.   On: 05/30/2020 19:12   DG CHEST PORT 1  VIEW  Result Date: 06/02/2020 CLINICAL DATA:  Pleural effusions EXAM: PORTABLE CHEST 1 VIEW COMPARISON:  June 01, 2020 chest radiograph and chest CT May 30, 2020 FINDINGS: Chest tube on the left inferiorly, unchanged in position. No pneumothorax. Small pleural effusions bilaterally appear essentially stable. Note that there is opacity in the left apex which likely represents a degree of loculated pleural effusion in this area based on CT findings. There is bibasilar atelectasis. No consolidation. Heart size and pulmonary vascularity are normal. No adenopathy. There is aortic atherosclerosis. IMPRESSION: Stable chest tube positioning on the left. Small pleural effusions bilaterally with a loculated component to left pleural effusion in the left apex region. Bibasilar atelectasis. No new opacity evident. Stable cardiac silhouette. Aortic Atherosclerosis (ICD10-I70.0). Electronically Signed   By: Lowella Grip III M.D.   On: 06/02/2020 08:11   DG Chest Port 1 View  Result Date: 06/01/2020 CLINICAL DATA:  Chest tube placement. EXAM: PORTABLE CHEST 1 VIEW COMPARISON:  05/30/2020 FINDINGS: New left pleural drain without evidence of left pneumothorax. Left pleural effusion has decreased in the interval. Loculated component in the left apex is similar. Small right pleural effusion persists. Residual basilar collapse/consolidative opacity without substantial change. The cardio pericardial silhouette is enlarged. The visualized bony structures of the thorax show no acute abnormality. IMPRESSION: Interval decrease in basilar left pleural effusion status post chest tube placement. No pleural gas visible. Otherwise stable exam. Electronically Signed  By: Misty Stanley M.D.   On: 06/01/2020 06:52   DG Chest Portable 1 View  Result Date: 05/30/2020 CLINICAL DATA:  Shortness of breath for several days EXAM: PORTABLE CHEST 1 VIEW COMPARISON:  05/26/2020 FINDINGS: Cardiac shadow is stable. Aortic calcifications  are again seen. Bilateral pleural effusions are noted left greater than right. The right is improved when compare with the prior exam although the left appears increased when compared prior exam underlying atelectasis/infiltrate is likely present. No bony abnormality is noted at this time. IMPRESSION: Bilateral effusions left greater than right. The left effusion has increased in the interval from the prior exam. Right effusion appears improved. Electronically Signed   By: Inez Catalina M.D.   On: 05/30/2020 16:46   ECHOCARDIOGRAM COMPLETE  Result Date: 05/31/2020    ECHOCARDIOGRAM REPORT   Patient Name:   Eduardo Murray Date of Exam: 05/31/2020 Medical Rec #:  [id number]        Height:       69.0 in Accession #:    [id number]       Weight:       176.6 lb Date of Birth:  26-Oct-1940        BSA:          1.960 m Patient Age:    78 years         BP:           140/78 mmHg Patient Gender: M                HR:           84 bpm. Exam Location:  Inpatient Procedure: 2D Echo, Color Doppler and Cardiac Doppler Indications:    R06.9 DOE  History:        Patient has no prior history of Echocardiogram examinations.                 Risk Factors:Hypertension and Diabetes.  Sonographer:    Raquel Sarna Senior RDCS Referring Phys: [id number] [place]  Sonographer Comments: Technically difficult study due to poor echo windows, possibly lung interference IMPRESSIONS  1. Left ventricular ejection fraction, by estimation, is 60 to 65%. The left ventricle has normal function. The left ventricle has no regional wall motion abnormalities. There is moderate asymmetric left ventricular hypertrophy of the basal-septal segment. Left ventricular diastolic parameters are consistent with Grade I diastolic dysfunction (impaired relaxation).  2. Right ventricular systolic function is normal. The right ventricular size is normal.  3. The mitral valve is normal in structure. No evidence of mitral valve regurgitation. No evidence of mitral stenosis.   4. The aortic valve is normal in structure. Aortic valve regurgitation is not visualized. No aortic stenosis is present.  5. The inferior vena cava is normal in size with greater than 50% respiratory variability, suggesting right atrial pressure of 3 mmHg. FINDINGS  Left Ventricle: Left ventricular ejection fraction, by estimation, is 60 to 65%. The left ventricle has normal function. The left ventricle has no regional wall motion abnormalities. The left ventricular internal cavity size was normal in size. There is  moderate asymmetric left ventricular hypertrophy of the basal-septal segment. Left ventricular diastolic parameters are consistent with Grade I diastolic dysfunction (impaired relaxation). Indeterminate filling pressures. Right Ventricle: The right ventricular size is normal. No increase in right ventricular wall thickness. Right ventricular systolic function is normal. Left Atrium: Left atrial size was normal in size. Right Atrium: Right atrial size was normal in size. Pericardium: There is  no evidence of pericardial effusion. Mitral Valve: The mitral valve is normal in structure. No evidence of mitral valve regurgitation. No evidence of mitral valve stenosis. Tricuspid Valve: The tricuspid valve is normal in structure. Tricuspid valve regurgitation is not demonstrated. No evidence of tricuspid stenosis. Aortic Valve: The aortic valve is normal in structure. Aortic valve regurgitation is not visualized. No aortic stenosis is present. Pulmonic Valve: The pulmonic valve was normal in structure. Pulmonic valve regurgitation is not visualized. No evidence of pulmonic stenosis. Aorta: The aortic root is normal in size and structure. Venous: The inferior vena cava is normal in size with greater than 50% respiratory variability, suggesting right atrial pressure of 3 mmHg. IAS/Shunts: No atrial level shunt detected by color flow Doppler.  LEFT VENTRICLE PLAX 2D LVIDd:         3.80 cm  Diastology LVIDs:          2.60 cm  LV e' medial:    5.11 cm/s LV PW:         1.00 cm  LV E/e' medial:  12.9 LV IVS:        1.40 cm  LV e' lateral:   5.33 cm/s LVOT diam:     2.10 cm  LV E/e' lateral: 12.4 LV SV:         54 LV SV Index:   27 LVOT Area:     3.46 cm  RIGHT VENTRICLE RV S prime:     7.51 cm/s TAPSE (M-mode): 1.7 cm LEFT ATRIUM             Index       RIGHT ATRIUM           Index LA diam:        2.90 cm 1.48 cm/m  RA Area:     14.40 cm LA Vol (A2C):   33.2 ml 16.94 ml/m RA Volume:   32.60 ml  16.64 ml/m LA Vol (A4C):   23.5 ml 11.99 ml/m LA Biplane Vol: 27.6 ml 14.09 ml/m  AORTIC VALVE LVOT Vmax:   89.50 cm/s LVOT Vmean:  65.300 cm/s LVOT VTI:    0.155 m  AORTA Ao Root diam: 3.40 cm MITRAL VALVE MV Area (PHT): 1.62 cm    SHUNTS MV Decel Time: 467 msec    Systemic VTI:  0.16 m MV E velocity: 66.10 cm/s  Systemic Diam: 2.10 cm MV A velocity: 98.50 cm/s MV E/A ratio:  0.67 Skeet Latch MD Electronically signed by Skeet Latch MD Signature Date/Time: 05/31/2020/11:29:55 AM    Final    CT IMAGE GUIDED DRAINAGE BY PERCUTANEOUS CATHETER  Result Date: 06/01/2020 INDICATION: Concern for empyema and loculated left-sided pleural effusion. Please perform CT-guided chest tube for infection source control purposes. EXAM: CT IMAGE GUIDED DRAINAGE BY PERCUTANEOUS CATHETER COMPARISON:  Chest CT-05/30/2020; chest radiograph-05/30/2020 MEDICATIONS: The patient is currently admitted to the hospital and receiving intravenous antibiotics. The antibiotics were administered within an appropriate time frame prior to the initiation of the procedure. ANESTHESIA/SEDATION: Moderate (conscious) sedation was employed during this procedure. A total of Versed 1.5 mg and Fentanyl 75 mcg was administered intravenously. Moderate Sedation Time: 19 minutes. The patient's level of consciousness and vital signs were monitored continuously by radiology nursing throughout the procedure under my direct supervision. CONTRAST:  None COMPLICATIONS: None  immediate. PROCEDURE: Informed written consent was obtained from the patient after a discussion of the risks, benefits and alternatives to treatment. The patient was placed supine, slightly RPO on the CT gantry and  a pre procedural CT was performed re-demonstrating the known moderate to large size likely partially loculated left-sided pleural effusion. The procedure was planned. A timeout was performed prior to the initiation of the procedure. The skin overlying the posteroinferior lateral aspect of the left chest was prepped and draped in the usual sterile fashion. The overlying soft tissues were anesthetized with 1% lidocaine with epinephrine. Appropriate trajectory was planned with the use of a 22 gauge spinal needle. An 18 gauge trocar needle was advanced into the abscess/fluid collection and a short Amplatz super stiff wire was coiled within the collection. Appropriate positioning was confirmed with a limited CT scan. The tract was serially dilated allowing placement of a 14 French all-purpose drainage catheter. Appropriate positioning was confirmed with a limited postprocedural CT scan. Approximately 700 cc of serous pleural fluid was aspirated following drainage catheter placement. The tube was connected to a pleura vac device and sutured in place. A dressing was placed. The patient tolerated the procedure well without immediate post procedural complication. IMPRESSION: Successful CT guided placement of a 15 French all purpose drain catheter into the left pleural space with aspiration of 700 cc of serous pleural fluid. Samples were sent to the laboratory as requested by the ordering clinical team. Electronically Signed   By: Sandi Mariscal M.D.   On: 06/01/2020 07:57    Labs:  CBC: Recent Labs    05/31/20 0506 06/01/20 0240 06/02/20 0350 06/03/20 0133  WBC 10.1 10.8* 12.3* 11.3*  HGB 12.7* 12.3* 12.8* 12.6*  HCT 39.5 38.7* 39.0 39.1  PLT 280 329 371 389    COAGS: No results for input(s): INR,  APTT in the last 8760 hours.  BMP: Recent Labs    05/26/20 1645 05/26/20 1645 05/30/20 1551 05/30/20 1551 05/31/20 0506 06/01/20 0240 06/02/20 0350 06/03/20 0133  NA 133*   < > 138   < > 138 133* 132* 131*  K 3.8   < > 3.9   < > 3.4* 3.9 4.2 4.9  CL 100   < > 97*   < > 99 95* 97* 97*  CO2 19*   < > 27   < > 28 28 26 26   GLUCOSE 204*   < > 201*   < > 91 219* 202* 246*  BUN 30*   < > 21   < > 21 29* 34* 36*  CALCIUM 8.6*   < > 8.7*   < > 8.5* 8.6* 9.0 9.0  CREATININE 1.30*   < > 1.09   < > 1.00 1.16 1.11 1.42*  GFRNONAA 52*   < > >60   < > >60 60* >60 47*  GFRAA >60  --  >60  --  >60  --   --   --    < > = values in this interval not displayed.    LIVER FUNCTION TESTS: Recent Labs    05/26/20 1645 06/01/20 0240 06/02/20 0350 06/03/20 0133  BILITOT 0.7 0.7 0.8 0.7  AST 30 16 21 24   ALT 36 21 24 31   ALKPHOS 60 47 47 44  PROT 6.6 6.1* 6.5 6.5  ALBUMIN 2.5* 2.2* 2.2* 2.2*    Assessment and Plan: Empyema, loculated pleural effusion S/p chest tube placement 06/01/20 by Dr. Pascal Lux. Patient managed by TCTS.   Chest tube currently to suction.  Discussed with TCTS PA. Plan made to remove from suction, pull chest tube.  RN has previously discussed obtaining labs on fluid-- not needed.   Chest tube removed  in its entirety without complication.  Patient tolerated well.   No further needs in IR at this time.  Portable CXR ordered.  Electronically Signed: Docia Barrier, PA 06/03/2020, 3:13 PM   I spent a total of 15 Minutes at the the patient's bedside AND on the patient's hospital floor or unit, greater than 50% of which was counseling/coordinating care for empyema.

## 2020-06-03 NOTE — Discharge Instructions (Signed)
Pleural Effusion Pleural effusion is an abnormal buildup of fluid in the layers of tissue between the lungs and the inside of the chest (pleural space) The two layers of tissue that line the lungs and the inside of the chest are called pleura. Usually, there is no air in the space between the pleura, only a thin layer of fluid. Some conditions can cause a large amount of fluid to build up, which can cause the lung to collapse if untreated. A pleural effusion is usually caused by another disease that requires treatment. What are the causes? Pleural effusion can be caused by:  Heart failure.  Certain infections, such as pneumonia or tuberculosis.  Cancer.  A blood clot in the lung (pulmonary embolism).  Complications from surgery, such as from open heart surgery.  Liver disease (cirrhosis).  Kidney disease. What are the signs or symptoms? In some cases, pleural effusion may cause no symptoms. If symptoms are present, they may include:  Shortness of breath, especially when lying down.  Chest pain. This may get worse when taking a deep breath.  Fever.  Dry, long-lasting (chronic) cough.  Hiccups.  Rapid breathing. An underlying condition that is causing the pleural effusion (such as heart failure, pneumonia, blood clots, tuberculosis, or cancer) may also cause other symptoms. How is this diagnosed? This condition may be diagnosed based on:  Your symptoms and medical history.  A physical exam.  A chest X-ray.  A procedure to use a needle to remove fluid from the pleural space (thoracentesis). This fluid is tested.  Other imaging studies of the chest, such as ultrasound or CT scan. How is this treated? Depending on the cause of your condition, treatment may include:  Treating the underlying condition that is causing the effusion. When that condition improves, the effusion will also improve. Examples of treatment for underlying conditions include: ? Antibiotic medicines to  treat an infection. ? Diuretics or other heart medicines to treat heart failure.  Thoracentesis.  Placing a thin flexible tube under your skin and into your chest to continuously drain the effusion (indwelling pleural catheter).  Surgery to remove the outer layer of tissue from the pleural space (decortication).  A procedure to put medicine into the chest cavity to seal the pleural space and prevent fluid buildup (pleurodesis).  Chemotherapy and radiation therapy, if you have cancerous (malignant) pleural effusion. These treatments are typically used to treat cancer. They kill certain cells in the body. Follow these instructions at home:  Take over-the-counter and prescription medicines only as told by your health care provider.  Ask your health care provider what activities are safe for you.  Keep track of how long you are able to do mild exercise (such as walking) before you get short of breath. Write down this information to share with your health care provider. Your ability to exercise should improve over time.  Do not use any products that contain nicotine or tobacco, such as cigarettes and e-cigarettes. If you need help quitting, ask your health care provider.  Keep all follow-up visits as told by your health care provider. This is important. Contact a health care provider if:  The amount of time that you are able to do mild exercise: ? Decreases. ? Does not improve with time.  You have a fever. Get help right away if:  You are short of breath.  You develop chest pain.  You develop a new cough. Summary  Pleural effusion is an abnormal buildup of fluid in the layers   of tissue between the lungs and the inside of the chest.  Pleural effusion can have many causes, including heart failure, pulmonary embolism, infections, or cancer.  Symptoms of pleural effusion can include shortness of breath, chest pain, fever, long-lasting (chronic) cough, hiccups, or rapid  breathing.  Diagnosis often involves making images of the chest (such as with ultrasound or X-ray) and removing fluid (thoracentesis) to send for testing.  Treatment for pleural effusion depends on what underlying condition is causing it. This information is not intended to replace advice given to you by your health care provider. Make sure you discuss any questions you have with your health care provider. Document Revised: 07/26/2017 Document Reviewed: 04/18/2017 [employer] Patient Education  2020 [employer]  

## 2020-06-03 NOTE — Progress Notes (Signed)
PROGRESS NOTE    Eduardo Murray  PJA:[id number] DOB: [birth year] DOA: 05/30/2020 PCP: Deland Pretty, MD   Brief Narrative:  HPI per Dr. Zada Finders on 05/30/20 Eduardo Murray is a 79 y.o. male with medical history significant for type 2 diabetes, hypertension, and RBBB who presents to the ED for evaluation of shortness of breath.  Patient states he has been having 1 week of new shortness of breath.  He has had associated cough productive of clear sputum.  He says he has been having chills, sweats, and rigors.  Patient was initially seen in the ED on 05/26/2020 and found to have bilateral pleural effusions on CXR.  He was otherwise felt to be stable and discharged to home with new prescription of oral Lasix.  Patient states he has been taking Lasix without significant improvement.  He was seen by his PCP earlier today who arranged for patient to return to the ED for further evaluation.  ED Course:  Initial vitals showed BP 157/89, pulse 108, RR 19, temp 98.0 Fahrenheit, SPO2 initially 90% on room air.  Patient placed on 2 L supplemental O2 via [place] with subsequent SPO2 >96%.  Labs show WBC 14.6, hemoglobin 13.6, platelets 372,000, sodium 138, potassium 3.9, bicarb 27, BUN 21, creatinine 1.09, serum glucose 201.  SARS-CoV-2 PCR is negative.  Influenza A/B PCR negative.  Portable chest x-ray showed bilateral effusions left greater than right with increased in left effusion size.  CT chest with contrast showed moderate loculated left pleural effusion with trace to small volume right pleural effusion.  Bilateral lower lobe passive atelectasis also seen.  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  Patient was started on IV ceftriaxone and azithromycin and the hospitalist service was consulted today for further evaluation and management.  **Interim History Patient underwent a Left sided Chest Tube Placement on 05/31/20 and had 700 mL Fluid out. Cx's are still pending but show NGTD at 3 Days. Feels  as if respiratory status is improving but had a significant cough yesterday that is improving. Off of Supplemental O2 again today but overnight he ended up having an AKI so his antihypertensives have been discontinued and started on low-dose IV fluids with normal saline at 75 mL/hr.   Assessment & Plan:   Principal Problem:   Sepsis due to pneumonia [facility]) Active Problems:   Type 2 diabetes mellitus without complications (HCC)   Loculated pleural effusion   Hypertension associated with diabetes ([place])  Sepsis due to Community-acquired pneumonia with left-sided loculated pleural effusion, Acute Hypoxic Respiratory Failure, sepsis physiology is improved -On Admission Patient presented with leukocytosis 14.6, tachycardia up to 112, and tachypnea with RR up to 30.  CT shows left-sided loculated effusion suspicious for pneumonia.  SPO2 90% on room air, improved on 2 L O2 via [place].  Covid is negative.  He has been started on empiric antibiotics. -Patient was started on broad-spectrum antibiotics with ceftriaxone and azithromycin and will continue.   -WBC had improved to 10.1 but started worsening and went to 10.8 -> 12.3 and now is 11.3 -Cultures are pending but Drain Cx showed NGTD at 3 Days; Blood Cx x2 showed NGTD at 3 days -C/w Guaifenesin and will change from 600 mg po BID to 1200 mg po BID -Check Legionella Pneumophila Serogp 1 Ur Ag and Strep Pneumoniae Urinary Ag -Thoracic surgery consulted and they recommending IR placing a pigtail catheter, discussed with IR and Chest Tube was placed yesterday and revealed 700 mL of Serous Fluid. Now  had 250 mL of serous Fluid in the Atrium; Cardiothoracic Surgery recommending to keep in place as there has been no Air Leak and they have no plans for Chest Tube on the Right -Thoracic Surgery Does not believe this is an Empyema -SpO2: 95 % O2 Flow Rate (L/min): 3 L/min; Not on Supplemental O2 via [place] again today and has been off of supplemental oxygen for 2  days -Repeat CXR today showed "Left chest tube in stable position. No pneumothorax. Low lung volumes with bibasilar atelectasis. Small bilateral pleural effusions again noted. These may have increased slightly from prior exam" -Chest Tube was pulled today By IR at the recommendation of TCTS -TOT evaluating and recommending home health PT and OT at discharge likely can be discharged next 24 to 48 hours if cleared by CT surgery and Interventional Radiology  DM2 with Associated Neuropathy -Continued reduced Lantus at 15 units sq qDaily and Sensitive Novolog SSI AC but will increase Lantus to 18 units  -Will add 3 units of Novolog TIDwm -Continue to hold Metformin -HbA1c was 7.1 -C/w Gabapentin 600 mg po qHS -Continue to Monitor Blood Sugars Carefully; CBG's ranging from 171-306  Essential Hypertension -Continued Lisinopril 40 mg po daily and Triamterene-HCTZ 37.5-25 mg po 1 tab Daily but held due to worsening Renal Fxn -Last BP was 116/69 -Continue to Monitor BP per Protocol   Chronic Diastolic Grade 1 Diastolic Dysfunction -ECHOCardiogram showed "Left ventricular diastolic parameters are consistent with Grade I diastolic dysfunction (impaired relaxation)." -Currently appears Euvolemic -Continue to Monitor S/Sx of Volume Overload -Repeat CXR this AM as above -Patient is -3.370 Liters since admission  -Will give gentle IVF Hydration with NS for 12 hours given AKI   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 -Unclear significance, obtain a 2D echo,  -ECHO showed no evidence of Pericardial Cyst and did not show a Pericardial Effusion -Thoracic surgery consulted as above and appreciate insight  Normocytic Anemia -Patient's Hgb/Hct went from 12.7/39.5 -> 12.3./38.7 -> 12.8/39.0 and today is 12.6/39.1 -Checked Anemia Panel and it showed iron level of 42, U IBC of 179, TIBC of 221, saturation of 19, ferritin level of 627, folate level 13.5, vitamin B12 level of  499 -Contineue to Monitor for S/Sx of Bleeding; Currently no overt bleeding noted -Repeat CBC in the AM  Hyponatremia/Hypochloremia -Patient's Na+ went from 138 -> 133 -> 132 and is 131 today and Patient's Chloride went from 99 -> 95 -> 97 and is 97 again today -We will give gentle IV fluid hydration with normal saline at 75 MLS per hour for 12 hours -Continue to Monitor and Trend -Repeat CMP in the AM  Hypokalemia -Improved. K+ went from 3.4 -> 3.8 -> 4.2 and is 4.9 today -Continue to Monitor and Replete as Necessary -Repeat CMP in the AM  Hypomagnesemia -Patient's Mag Level this AM was 2.0 -Continue to Monitor and Replete as Necessary -Repeat Mag Level in the AM   Leukocytosis -Patient's WBC went from 10.1 -> 10.8 -> 12.3 and is now 11.3; In the setting of Above -Patient is Afebrile  -Continue to Monitor and Trend -Repeat CBC in the AM   AKI on CKD Stage 2 -BUN/creatinine on admission was 29/1.16 and improved to 34/1.11 and today it is 36/1.4 today-we will hold lisinopril and his triamterene hydrochlorothiazide pill -We check a bladder scan, urine electrolytes and urine osmolality -We will gently hydrate with IV fluids with normal saline at 75 MLS per hour next -Avoid further nephrotoxic medications,  contrast dyes, hypotension and renally adjust medications -Continue to monitor and trend renal function carefully and repeat CMP in a.m.  DVT prophylaxis: Enoxaparin 40 mg sq q24h Code Status: FULL CODE Family Communication: No family present at bedside  Disposition Plan: Pending further clinical improvement and clearance by IR and Thoracic Surgery  Status is: Inpatient  Remains inpatient appropriate because:Unsafe d/c plan, IV treatments appropriate due to intensity of illness or inability to take PO and Inpatient level of care appropriate due to severity of illness   Dispo: The patient is from: Home              Anticipated d/c is to: TBD              Anticipated d/c date  is: 2 days              Patient currently is not medically stable to d/c.  Consultants:   Cardiothoracic Surgery Dr. Lanelle Bal  IR Dr. Sandi Mariscal   Procedures:  Technically successful CT guided placed of a 14 Fr drainage catheter placement into the left pleural space yielding 700 cc of serous pleural fluid done by Dr. Sandi Mariscal.    ECHOCARDIOGRAM IMPRESSIONS    1. Left ventricular ejection fraction, by estimation, is 60 to 65%. The  left ventricle has normal function. The left ventricle has no regional  wall motion abnormalities. There is moderate asymmetric left ventricular  hypertrophy of the basal-septal  segment. Left ventricular diastolic parameters are consistent with Grade I  diastolic dysfunction (impaired relaxation).  2. Right ventricular systolic function is normal. The right ventricular  size is normal.  3. The mitral valve is normal in structure. No evidence of mitral valve  regurgitation. No evidence of mitral stenosis.  4. The aortic valve is normal in structure. Aortic valve regurgitation is  not visualized. No aortic stenosis is present.  5. The inferior vena cava is normal in size with greater than 50%  respiratory variability, suggesting right atrial pressure of 3 mmHg.   FINDINGS  Left Ventricle: Left ventricular ejection fraction, by estimation, is 60  to 65%. The left ventricle has normal function. The left ventricle has no  regional wall motion abnormalities. The left ventricular internal cavity  size was normal in size. There is  moderate asymmetric left ventricular hypertrophy of the basal-septal  segment. Left ventricular diastolic parameters are consistent with Grade I  diastolic dysfunction (impaired relaxation). Indeterminate filling  pressures.   Right Ventricle: The right ventricular size is normal. No increase in  right ventricular wall thickness. Right ventricular systolic function is  normal.   Left Atrium: Left atrial size  was normal in size.   Right Atrium: Right atrial size was normal in size.   Pericardium: There is no evidence of pericardial effusion.   Mitral Valve: The mitral valve is normal in structure. No evidence of  mitral valve regurgitation. No evidence of mitral valve stenosis.   Tricuspid Valve: The tricuspid valve is normal in structure. Tricuspid  valve regurgitation is not demonstrated. No evidence of tricuspid  stenosis.   Aortic Valve: The aortic valve is normal in structure. Aortic valve  regurgitation is not visualized. No aortic stenosis is present.   Pulmonic Valve: The pulmonic valve was normal in structure. Pulmonic valve  regurgitation is not visualized. No evidence of pulmonic stenosis.   Aorta: The aortic root is normal in size and structure.   Venous: The inferior vena cava is normal in size with greater than 50%  respiratory variability, suggesting right atrial pressure of 3 mmHg.   IAS/Shunts: No atrial level shunt detected by color flow Doppler.     LEFT VENTRICLE  PLAX 2D  LVIDd:     3.80 cm Diastology  LVIDs:     2.60 cm LV e' medial:  5.11 cm/s  LV PW:     1.00 cm LV E/e' medial: 12.9  LV IVS:    1.40 cm LV e' lateral:  5.33 cm/s  LVOT diam:   2.10 cm LV E/e' lateral: 12.4  LV SV:     54  LV SV Index:  27  LVOT Area:   3.46 cm     RIGHT VENTRICLE  RV S prime:   7.51 cm/s  TAPSE (M-mode): 1.7 cm   LEFT ATRIUM       Index    RIGHT ATRIUM      Index  LA diam:    2.90 cm 1.48 cm/m RA Area:   14.40 cm  LA Vol (A2C):  33.2 ml 16.94 ml/m RA Volume:  32.60 ml 16.64 ml/m  LA Vol (A4C):  23.5 ml 11.99 ml/m  LA Biplane Vol: 27.6 ml 14.09 ml/m  AORTIC VALVE  LVOT Vmax:  89.50 cm/s  LVOT Vmean: 65.300 cm/s  LVOT VTI:  0.155 m    AORTA  Ao Root diam: 3.40 cm   MITRAL VALVE  MV Area (PHT): 1.62 cm  SHUNTS  MV Decel Time: 467 msec  Systemic VTI: 0.16 m  MV E velocity: 66.10  cm/s Systemic Diam: 2.10 cm  MV A velocity: 98.50 cm/s  MV E/A ratio: 0.67   Antimicrobials:  Anti-infectives (From admission, onward)   Start     Dose/Rate Route Frequency Ordered Stop   06/01/20 1600  azithromycin (ZITHROMAX) tablet 500 mg        500 mg Oral Daily 06/01/20 1456 06/03/20 0929   05/31/20 2000  azithromycin (ZITHROMAX) 500 mg in sodium chloride 0.9 % 250 mL IVPB  Status:  Discontinued        500 mg 250 mL/hr over 60 Minutes Intravenous Every 24 hours 05/30/20 2126 06/01/20 1456   05/31/20 0800  cefTRIAXone (ROCEPHIN) 2 g in sodium chloride 0.9 % 100 mL IVPB        2 g 200 mL/hr over 30 Minutes Intravenous Every 24 hours 05/30/20 2126 06/05/20 0759   05/30/20 2000  cefTRIAXone (ROCEPHIN) 1 g in sodium chloride 0.9 % 100 mL IVPB        1 g 200 mL/hr over 30 Minutes Intravenous  Once 05/30/20 1949 05/30/20 2039   05/30/20 2000  azithromycin (ZITHROMAX) tablet 500 mg        500 mg Oral  Once 05/30/20 1949 05/30/20 2002       Subjective: Seen and examined at bedside and he was sitting in the chair bedside and had just work with PT and OT.  Discussed with him about holding his antihypertensive given his slightly worsened renal function is in agreement.  No nausea or vomiting.  Thinks his shortness of breath is improving.  No other concerns or complaints at this time.  Objective: Vitals:   06/02/20 1559 06/02/20 2206 06/03/20 0500 06/03/20 0834  BP: 100/62 116/61  116/69  Pulse: 94   95  Resp: 16 14  16   Temp:  98 F (36.7 C)  (!) 97.5 F (36.4 C)  TempSrc:  Oral    SpO2: 95% 94%  95%  Weight:   81.3 kg   Height:  Intake/Output Summary (Last 24 hours) at 06/03/2020 1607 Last data filed at 06/03/2020 1100 Gross per 24 hour  Intake 477 ml  Output 1260 ml  Net -783 ml   Filed Weights   06/01/20 0547 06/01/20 0647 06/03/20 0500  Weight: 80.3 kg 82.1 kg 81.3 kg   Examination: Physical Exam:  Constitutional: WN/WD Caucasian male in no acute distress  appears calm but slightly uncomfortable sitting in a chair bedside Eyes: Lids and conjunctivae normal, sclerae anicteric  ENMT: External Ears, Nose appear normal. Grossly normal hearing.  Mildly poor dentition Neck: Appears normal, supple, no cervical masses, normal ROM, no appreciable thyromegaly; no JVD Respiratory: Mildly diminished to auscultation bilaterally with coarse breath sounds at the bases and slight rhonchi and minimal crackles but no appreciable wheezing and rales noted. Normal respiratory effort and patient is not tachypenic. No accessory muscle use.  He is off of supplemental oxygen via nasal cannula and left-sided chest tube is still in place Cardiovascular: RRR, no murmurs / rubs / gallops. S1 and S2 auscultated. No extremity edema.  Abdomen: Soft, non-tender, distended secondary body habitus. Bowel sounds positive.  GU: Deferred. Musculoskeletal: No clubbing / cyanosis of digits/nails. No joint deformity upper and lower extremities. Skin: No rashes, lesions, ulcers on a limited skin evaluation. No induration; Warm and dry.  Neurologic: CN 2-12 grossly intact with no focal deficits. Romberg sign and cerebellar reflexes not assessed.  Psychiatric: Normal judgment and insight. Alert and oriented x 3. Normal mood and appropriate affect.   Data Reviewed: I have personally reviewed following labs and imaging studies  CBC: Recent Labs  Lab 05/30/20 1551 05/31/20 0506 06/01/20 0240 06/02/20 0350 06/03/20 0133  WBC 14.6* 10.1 10.8* 12.3* 11.3*  NEUTROABS 11.6*  --   --  9.0* 8.4*  HGB 13.6 12.7* 12.3* 12.8* 12.6*  HCT 42.8 39.5 38.7* 39.0 39.1  MCV 89.7 89.8 90.4 89.9 88.9  PLT 372 280 329 371 025   Basic Metabolic Panel: Recent Labs  Lab 05/30/20 1551 05/31/20 0506 06/01/20 0240 06/02/20 0350 06/03/20 0133  NA 138 138 133* 132* 131*  K 3.9 3.4* 3.9 4.2 4.9  CL 97* 99 95* 97* 97*  CO2 27 28 28 26 26   GLUCOSE 201* 91 219* 202* 246*  BUN 21 21 29* 34* 36*   CREATININE 1.09 1.00 1.16 1.11 1.42*  CALCIUM 8.7* 8.5* 8.6* 9.0 9.0  MG  --   --  1.5* 1.8 2.0  PHOS  --   --   --  3.5 3.9   GFR: Estimated Creatinine Clearance: 42.9 mL/min (A) (by C-G formula based on SCr of 1.42 mg/dL (H)). Liver Function Tests: Recent Labs  Lab 06/01/20 0240 06/02/20 0350 06/03/20 0133  AST 16 21 24   ALT 21 24 31   ALKPHOS 47 47 44  BILITOT 0.7 0.8 0.7  PROT 6.1* 6.5 6.5  ALBUMIN 2.2* 2.2* 2.2*   No results for input(s): LIPASE, AMYLASE in the last 168 hours. No results for input(s): AMMONIA in the last 168 hours. Coagulation Profile: No results for input(s): INR, PROTIME in the last 168 hours. Cardiac Enzymes: No results for input(s): CKTOTAL, CKMB, CKMBINDEX, TROPONINI in the last 168 hours. BNP (last 3 results) No results for input(s): PROBNP in the last 8760 hours. HbA1C: No results for input(s): HGBA1C in the last 72 hours. CBG: Recent Labs  Lab 06/02/20 1214 06/02/20 1639 06/02/20 2134 06/03/20 0751 06/03/20 1211  GLUCAP 391* 171* 261* 205* 306*   Lipid Profile: No results for input(s):  CHOL, HDL, LDLCALC, TRIG, CHOLHDL, LDLDIRECT in the last 72 hours. Thyroid Function Tests: No results for input(s): TSH, T4TOTAL, FREET4, T3FREE, THYROIDAB in the last 72 hours. Anemia Panel: Recent Labs    06/03/20 0133  VITAMINB12 499  FOLATE 13.5  FERRITIN 627*  TIBC 221*  IRON 42*  RETICCTPCT 1.4   Sepsis Labs: No results for input(s): PROCALCITON, LATICACIDVEN in the last 168 hours.  Recent Results (from the past 240 hour(s))  Respiratory Panel by RT PCR (Flu A&B, Covid) - Nasopharyngeal Swab     Status: None   Collection Time: 05/26/20 10:40 PM   Specimen: Nasopharyngeal Swab  Result Value Ref Range Status   SARS Coronavirus 2 by RT PCR NEGATIVE NEGATIVE Final    Comment: (NOTE) SARS-CoV-2 target nucleic acids are NOT DETECTED.  The SARS-CoV-2 RNA is generally detectable in upper respiratoy specimens during the acute phase of  infection. The lowest concentration of SARS-CoV-2 viral copies this assay can detect is 131 copies/mL. A negative result does not preclude SARS-Cov-2 infection and should not be used as the sole basis for treatment or other patient management decisions. A negative result may occur with  improper specimen collection/handling, submission of specimen other than nasopharyngeal swab, presence of viral mutation(s) within the areas targeted by this assay, and inadequate number of viral copies (<131 copies/mL). A negative result must be combined with clinical observations, patient history, and epidemiological information. The expected result is Negative.  Fact Sheet for Patients:  PinkCheek.be  Fact Sheet for Healthcare Providers:  GravelBags.it  This test is no t yet approved or cleared by the [place] FDA and  has been authorized for detection and/or diagnosis of SARS-CoV-2 by FDA under an Emergency Use Authorization (EUA). This EUA will remain  in effect (meaning this test can be used) for the duration of the COVID-19 declaration under Section 564(b)(1) of the Act, 21 U.S.C. section 360bbb-3(b)(1), unless the authorization is terminated or revoked sooner.     Influenza A by PCR NEGATIVE NEGATIVE Final   Influenza B by PCR NEGATIVE NEGATIVE Final    Comment: (NOTE) The Xpert Xpress SARS-CoV-2/FLU/RSV assay is intended as an aid in  the diagnosis of influenza from Nasopharyngeal swab specimens and  should not be used as a sole basis for treatment. Nasal washings and  aspirates are unacceptable for Xpert Xpress SARS-CoV-2/FLU/RSV  testing.  Fact Sheet for Patients: PinkCheek.be  Fact Sheet for Healthcare Providers: GravelBags.it  This test is not yet approved or cleared by the [place] FDA and  has been authorized for detection and/or diagnosis of SARS-CoV-2  by  FDA under an Emergency Use Authorization (EUA). This EUA will remain  in effect (meaning this test can be used) for the duration of the  Covid-19 declaration under Section 564(b)(1) of the Act, 21  U.S.C. section 360bbb-3(b)(1), unless the authorization is  terminated or revoked. Performed at [facility], [place] [address] [place], [place] [address]   Respiratory Panel by RT PCR (Flu A&B, Covid) - Nasopharyngeal Swab     Status: None   Collection Time: 05/30/20  3:53 PM   Specimen: Nasopharyngeal Swab  Result Value Ref Range Status   SARS Coronavirus 2 by RT PCR NEGATIVE NEGATIVE Final    Comment: (NOTE) SARS-CoV-2 target nucleic acids are NOT DETECTED.  The SARS-CoV-2 RNA is generally detectable in upper respiratoy specimens during the acute phase of infection. The lowest concentration of SARS-CoV-2 viral copies this assay can detect is 131 copies/mL. A negative  result does not preclude SARS-Cov-2 infection and should not be used as the sole basis for treatment or other patient management decisions. A negative result may occur with  improper specimen collection/handling, submission of specimen other than nasopharyngeal swab, presence of viral mutation(s) within the areas targeted by this assay, and inadequate number of viral copies (<131 copies/mL). A negative result must be combined with clinical observations, patient history, and epidemiological information. The expected result is Negative.  Fact Sheet for Patients:  PinkCheek.be  Fact Sheet for Healthcare Providers:  GravelBags.it  This test is no t yet approved or cleared by the [place] FDA and  has been authorized for detection and/or diagnosis of SARS-CoV-2 by FDA under an Emergency Use Authorization (EUA). This EUA will remain  in effect (meaning this test can be used) for the duration of the COVID-19 declaration under Section 564(b)(1) of the Act,  21 U.S.C. section 360bbb-3(b)(1), unless the authorization is terminated or revoked sooner.     Influenza A by PCR NEGATIVE NEGATIVE Final   Influenza B by PCR NEGATIVE NEGATIVE Final    Comment: (NOTE) The Xpert Xpress SARS-CoV-2/FLU/RSV assay is intended as an aid in  the diagnosis of influenza from Nasopharyngeal swab specimens and  should not be used as a sole basis for treatment. Nasal washings and  aspirates are unacceptable for Xpert Xpress SARS-CoV-2/FLU/RSV  testing.  Fact Sheet for Patients: PinkCheek.be  Fact Sheet for Healthcare Providers: GravelBags.it  This test is not yet approved or cleared by the [place] FDA and  has been authorized for detection and/or diagnosis of SARS-CoV-2 by  FDA under an Emergency Use Authorization (EUA). This EUA will remain  in effect (meaning this test can be used) for the duration of the  Covid-19 declaration under Section 564(b)(1) of the Act, 21  U.S.C. section 360bbb-3(b)(1), unless the authorization is  terminated or revoked. Performed at [facility], [place] [address], [place], [place] [address]   Culture, blood (Routine X 2) w Reflex to ID Panel     Status: None (Preliminary result)   Collection Time: 05/31/20  5:06 AM   Specimen: BLOOD LEFT ARM  Result Value Ref Range Status   Specimen Description BLOOD LEFT ARM  Final   Special Requests   Final    BOTTLES DRAWN AEROBIC AND ANAEROBIC Blood Culture adequate volume   Culture   Final    NO GROWTH 3 DAYS Performed at [facility], [place] [address] [place], [place] [address]    Report Status PENDING  Incomplete  Culture, blood (Routine X 2) w Reflex to ID Panel     Status: None (Preliminary result)   Collection Time: 05/31/20  5:06 AM   Specimen: BLOOD RIGHT HAND  Result Value Ref Range Status   Specimen Description BLOOD RIGHT HAND  Final   Special Requests   Final    BOTTLES DRAWN AEROBIC ONLY Blood  Culture adequate volume   Culture   Final    NO GROWTH 3 DAYS Performed at [facility], [place] [address], [place], [place] [address]    Report Status PENDING  Incomplete  Aerobic/Anaerobic Culture (surgical/deep wound)     Status: None (Preliminary result)   Collection Time: 05/31/20  5:09 PM   Specimen: Abscess  Result Value Ref Range Status   Specimen Description ABSCESS  Final   Special Requests DRAIN  Final   Gram Stain   Final    RARE WBC PRESENT, PREDOMINANTLY PMN NO ORGANISMS  SEEN    Culture   Final    NO GROWTH 3 DAYS NO ANAEROBES ISOLATED; CULTURE IN PROGRESS FOR 5 DAYS Performed at [facility], [place] [address], [place], [place] [address]    Report Status PENDING  Incomplete     RN Pressure Injury Documentation:     Estimated body mass index is 26.47 kg/m as calculated from the following:   Height as of this encounter: 5\' 9"  (1.753 m).   Weight as of this encounter: 81.3 kg.  Malnutrition Type:  Nutrition Problem: Increased nutrient needs Etiology: acute illness (sepsis 2/2 CAP)   Malnutrition Characteristics:  Signs/Symptoms: estimated needs   Nutrition Interventions:  Interventions: MVI, Premier Protein   Radiology Studies: DG Chest 2 View  Result Date: 06/03/2020 CLINICAL DATA:  Shortness of breath. EXAM: CHEST - 2 VIEW COMPARISON:  06/02/2020.  CT 05/30/2020. FINDINGS: Left chest tube in stable position. No pneumothorax. Heart size stable. Low lung volumes with bibasilar atelectasis. Small bilateral pleural effusions again noted these may have increased slightly from prior exam. IMPRESSION: 1. Left chest tube in stable position. No pneumothorax. 2. Low lung volumes with bibasilar atelectasis. Small bilateral pleural effusions again noted. These may have increased slightly from prior exam. Electronically Signed   By: [place]   On: 06/03/2020 07:45   DG CHEST PORT 1 VIEW  Result Date: 06/02/2020 CLINICAL DATA:  Pleural effusions  EXAM: PORTABLE CHEST 1 VIEW COMPARISON:  June 01, 2020 chest radiograph and chest CT May 30, 2020 FINDINGS: Chest tube on the left inferiorly, unchanged in position. No pneumothorax. Small pleural effusions bilaterally appear essentially stable. Note that there is opacity in the left apex which likely represents a degree of loculated pleural effusion in this area based on CT findings. There is bibasilar atelectasis. No consolidation. Heart size and pulmonary vascularity are normal. No adenopathy. There is aortic atherosclerosis. IMPRESSION: Stable chest tube positioning on the left. Small pleural effusions bilaterally with a loculated component to left pleural effusion in the left apex region. Bibasilar atelectasis. No new opacity evident. Stable cardiac silhouette. Aortic Atherosclerosis (ICD10-I70.0). Electronically Signed   By: Lowella Grip III M.D.   On: 06/02/2020 08:11   Scheduled Meds: . enoxaparin (LOVENOX) injection  40 mg Subcutaneous Q24H  . gabapentin  600 mg Oral QHS  . guaiFENesin  1,200 mg Oral BID  . insulin aspart  0-9 Units Subcutaneous TID WC  . insulin aspart  3 Units Subcutaneous TID WC  . insulin glargine  15 Units Subcutaneous QHS  . multivitamin with minerals  1 tablet Oral Daily  . pantoprazole  40 mg Oral Daily  . Ensure Max Protein  11 oz Oral BID  . sodium chloride flush  3 mL Intravenous Q12H   Continuous Infusions: . sodium chloride 75 mL/hr at 06/03/20 0813  . cefTRIAXone (ROCEPHIN)  IV 2 g (06/03/20 0815)    LOS: 4 days   Kerney Elbe, DO [facility] PAGER is on [place]  If 7PM-7AM, please contact night-coverage [email or web address]

## 2020-06-03 NOTE — Progress Notes (Addendum)
[address]       [place] [address]             [phone]         Subjective: conts to slowly feel better  Objective: Vital signs in last 24 hours: Temp:  [98 F (36.7 C)] 98 F (36.7 C) (10/07 2206) Pulse Rate:  [94] 94 (10/07 1559) Cardiac Rhythm: Normal sinus rhythm;Heart block;Bundle branch block (10/08 0700) Resp:  [14-16] 14 (10/07 2206) BP: (100-116)/(61-62) 116/61 (10/07 2206) SpO2:  [94 %-95 %] 94 % (10/07 2206) Weight:  [81.3 kg] 81.3 kg (10/08 0500)  Hemodynamic parameters for last 24 hours:    Intake/Output from previous day: 10/07 0701 - 10/08 0700 In: 480 [P.O.:477; I.V.:3] Out: 1700 [Urine:1675; Chest Tube:25] Intake/Output this shift: No intake/output data recorded.  General appearance: alert, cooperative and no distress Heart: regular rate and rhythm Lungs: dim in bases Abdomen: soft, non tender  Lab Results: Recent Labs    06/02/20 0350 06/03/20 0133  WBC 12.3* 11.3*  HGB 12.8* 12.6*  HCT 39.0 39.1  PLT 371 389   BMET:  Recent Labs    06/02/20 0350 06/03/20 0133  NA 132* 131*  K 4.2 4.9  CL 97* 97*  CO2 26 26  GLUCOSE 202* 246*  BUN 34* 36*  CREATININE 1.11 1.42*  CALCIUM 9.0 9.0    PT/INR: No results for input(s): LABPROT, INR in the last 72 hours. ABG    Component Value Date/Time   TCO2 26 11/24/2012 1640   CBG (last 3)  Recent Labs    06/02/20 1214 06/02/20 1639 06/02/20 2134  GLUCAP 391* 171* 261*    Meds Scheduled Meds: . azithromycin  500 mg Oral Daily  . enoxaparin (LOVENOX) injection  40 mg Subcutaneous Q24H  . gabapentin  600 mg Oral QHS  . guaiFENesin  1,200 mg Oral BID  . insulin aspart  0-9 Units Subcutaneous TID WC  . insulin aspart  3 Units Subcutaneous TID WC  . insulin glargine  15 Units Subcutaneous QHS  . lisinopril  40 mg Oral Daily  . multivitamin with minerals  1 tablet Oral Daily  . pantoprazole  40 mg Oral Daily  . Ensure Max Protein  11 oz Oral BID  . sodium  chloride flush  3 mL Intravenous Q12H  . triamterene-hydrochlorothiazide  1 tablet Oral Daily   Continuous Infusions: . cefTRIAXone (ROCEPHIN)  IV 2 g (06/02/20 1331)   PRN Meds:.acetaminophen **OR** acetaminophen, melatonin, ondansetron (ZOFRAN) IV, traZODone  Xrays DG CHEST PORT 1 VIEW  Result Date: 06/02/2020 CLINICAL DATA:  Pleural effusions EXAM: PORTABLE CHEST 1 VIEW COMPARISON:  June 01, 2020 chest radiograph and chest CT May 30, 2020 FINDINGS: Chest tube on the left inferiorly, unchanged in position. No pneumothorax. Small pleural effusions bilaterally appear essentially stable. Note that there is opacity in the left apex which likely represents a degree of loculated pleural effusion in this area based on CT findings. There is bibasilar atelectasis. No consolidation. Heart size and pulmonary vascularity are normal. No adenopathy. There is aortic atherosclerosis. IMPRESSION: Stable chest tube positioning on the left. Small pleural effusions bilaterally with a loculated component to left pleural effusion in the left apex region. Bibasilar atelectasis. No new opacity evident. Stable cardiac silhouette. Aortic Atherosclerosis (ICD10-I70.0). Electronically Signed   By: Lowella Grip III M.D.   On: 06/02/2020 08:11    Assessment/Plan:  1 afeb, VSS 2 sats good on RA 3 leukocytosis improved  4 CXR small bilat effus 5 CT drainage only 25 cc/24 h- poss remove today     LOS: 4 days    Eduardo Murray 06/03/2020 I have seen and examined Eduardo Murray and agree with the above assessment  and plan.  Grace Isaac MD Beeper [phone] Office [phone] 06/03/2020 10:40 AM

## 2020-06-03 NOTE — Evaluation (Signed)
Physical Therapy Evaluation Patient Details Name: Eduardo Murray MRN: [id number] DOB: 1941-05-13 Today's Date: 06/03/2020   History of Present Illness  79 y.o. male with medical history significant for type 2 diabetes, hypertension, and RBBB who presented to the ED for evaluation of shortness of breath. Pt admitted with dx of sepsis due to PNA.    Clinical Impression  Pt admitted with above diagnosis. PTA pt independent, living at home with his wife. On eval, he required min assist bed mobility, min guard assist sit to stand from elevated surface, and min guard assist ambulation 100' with RW. Pt mobilized on RA. SpO2 94% at rest. Desat to 87% during amb. 2-minute recovery time in recliner to return to 94%. 1/4 DOE noted. Pt with c/o pain or dizziness, just stating he feels weak. Pt will benefit from skilled PT to increase their independence and safety with mobility to allow discharge to the venue listed below.      Follow Up Recommendations Home health PT;Supervision/Assistance - 24 hour    Equipment Recommendations  None recommended by PT    Recommendations for Other Services       Precautions / Restrictions Precautions Precautions: Fall;Other (comment) Precaution Comments: L chest tube      Mobility  Bed Mobility Overal bed mobility: Needs Assistance Bed Mobility: Supine to Sit     Supine to sit: HOB elevated     General bed mobility comments: +rail, assist to elevate trunk, increased time  Transfers Overall transfer level: Needs assistance Equipment used: Rolling walker (2 wheeled) Transfers: Sit to/from Stand Sit to Stand: Min guard;From elevated surface         General transfer comment: cues for hand placement  Ambulation/Gait Ambulation/Gait assistance: Min guard Gait Distance (Feet): 100 Feet Assistive device: Rolling walker (2 wheeled) Gait Pattern/deviations: Step-through pattern;Decreased stride length Gait velocity: decreased Gait velocity  interpretation: <1.31 ft/sec, indicative of household ambulator General Gait Details: Amb on RA with desat to 87%. Max HR 104. 1/4 DOE  Financial trader Rankin (Stroke Patients Only)       Balance Overall balance assessment: Needs assistance Sitting-balance support: No upper extremity supported;Feet supported Sitting balance-Leahy Scale: Good     Standing balance support: Bilateral upper extremity supported;During functional activity   Standing balance comment: reliant on BUE support                             Pertinent Vitals/Pain Pain Assessment: No/denies pain    Home Living Family/patient expects to be discharged to:: Private residence Living Arrangements: Spouse/significant other Available Help at Discharge: Family;Available 24 hours/day Type of Home: House Home Access: Stairs to enter Entrance Stairs-Rails: Can reach both;Right;Left Entrance Stairs-Number of Steps: 3   Home Equipment: [place] - 2 wheels;Walker - 4 wheels;Cane - quad;Cane - single point;Shower seat;Grab bars - tub/shower;Grab bars - toilet;Wheelchair - manual Additional Comments: Pt with good sense of humor, stating "I have a wheelchair with a motor. It's called a Conservation officer, nature."    Prior Function Level of Independence: Independent         Comments: Pt reports limited mobility over past week due to illness. Reports one fall without injury.     Hand Dominance        Extremity/Trunk Assessment   Upper Extremity Assessment Upper Extremity Assessment: Defer to OT evaluation    Lower Extremity Assessment Lower  Extremity Assessment: Generalized weakness    Cervical / Trunk Assessment Cervical / Trunk Assessment: Kyphotic  Communication   Communication: No difficulties  Cognition Arousal/Alertness: Awake/alert Behavior During Therapy: WFL for tasks assessed/performed Overall Cognitive Status: Within Functional Limits for tasks assessed                                  General Comments: When asked "are you Mr. Bun?" Pt replied "What's left of him."      General Comments General comments (skin integrity, edema, etc.): SpO2 94% at rest on RA.    Exercises     Assessment/Plan    PT Assessment Patient needs continued PT services  PT Problem List Decreased strength;Decreased mobility;Decreased activity tolerance;Cardiopulmonary status limiting activity;Decreased balance       PT Treatment Interventions Therapeutic activities;DME instruction;Gait training;Therapeutic exercise;Patient/family education;Stair training;Balance training;Functional mobility training    PT Goals (Current goals can be found in the Care Plan section)  Acute Rehab PT Goals Patient Stated Goal: get better PT Goal Formulation: With patient Time For Goal Achievement: 06/17/20 Potential to Achieve Goals: Good    Frequency Min 3X/week   Barriers to discharge        Co-evaluation               AM-PAC PT "6 Clicks" Mobility  Outcome Measure Help needed turning from your back to your side while in a flat bed without using bedrails?: None Help needed moving from lying on your back to sitting on the side of a flat bed without using bedrails?: A Little Help needed moving to and from a bed to a chair (including a wheelchair)?: A Little Help needed standing up from a chair using your arms (e.g., wheelchair or bedside chair)?: A Little Help needed to walk in hospital room?: A Little Help needed climbing 3-5 steps with a railing? : A Lot 6 Click Score: 18    End of Session Equipment Utilized During Treatment: Gait belt Activity Tolerance: Patient tolerated treatment well Patient left: in chair;with call bell/phone within reach Nurse Communication: Mobility status PT Visit Diagnosis: Muscle weakness (generalized) (M62.81);Difficulty in walking, not elsewhere classified (R26.2)    Time: 2633-3545 PT Time Calculation (min)  (ACUTE ONLY): 26 min   Charges:   PT Evaluation $PT Eval Moderate Complexity: 1 Mod PT Treatments $Gait Training: 8-22 mins        Lorrin Goodell, PT  Office # [phone] Pager [phone]   Eduardo Murray 06/03/2020, 8:50 AM

## 2020-06-04 ENCOUNTER — Inpatient Hospital Stay (HOSPITAL_COMMUNITY): Payer: Medicare HMO

## 2020-06-04 LAB — CBC WITH DIFFERENTIAL/PLATELET
Abs Immature Granulocytes: 0.07 10*3/uL (ref 0.00–0.07)
Basophils Absolute: 0.1 10*3/uL (ref 0.0–0.1)
Basophils Relative: 1 %
Eosinophils Absolute: 0.2 10*3/uL (ref 0.0–0.5)
Eosinophils Relative: 2 %
HCT: 42.2 % (ref 39.0–52.0)
Hemoglobin: 13.4 g/dL (ref 13.0–17.0)
Immature Granulocytes: 1 %
Lymphocytes Relative: 20 %
Lymphs Abs: 1.8 10*3/uL (ref 0.7–4.0)
MCH: 28.7 pg (ref 26.0–34.0)
MCHC: 31.8 g/dL (ref 30.0–36.0)
MCV: 90.4 fL (ref 80.0–100.0)
Monocytes Absolute: 0.6 10*3/uL (ref 0.1–1.0)
Monocytes Relative: 7 %
Neutro Abs: 6.3 10*3/uL (ref 1.7–7.7)
Neutrophils Relative %: 69 %
Platelets: 402 10*3/uL — ABNORMAL HIGH (ref 150–400)
RBC: 4.67 MIL/uL (ref 4.22–5.81)
RDW: 13.1 % (ref 11.5–15.5)
WBC: 9 10*3/uL (ref 4.0–10.5)
nRBC: 0 % (ref 0.0–0.2)

## 2020-06-04 LAB — COMPREHENSIVE METABOLIC PANEL
ALT: 30 U/L (ref 0–44)
AST: 25 U/L (ref 15–41)
Albumin: 2.4 g/dL — ABNORMAL LOW (ref 3.5–5.0)
Alkaline Phosphatase: 46 U/L (ref 38–126)
Anion gap: 8 (ref 5–15)
BUN: 33 mg/dL — ABNORMAL HIGH (ref 8–23)
CO2: 27 mmol/L (ref 22–32)
Calcium: 9.1 mg/dL (ref 8.9–10.3)
Chloride: 100 mmol/L (ref 98–111)
Creatinine, Ser: 1.21 mg/dL (ref 0.61–1.24)
GFR, Estimated: 57 mL/min — ABNORMAL LOW (ref >60)
Glucose, Bld: 203 mg/dL — ABNORMAL HIGH (ref 70–99)
Potassium: 5.1 mmol/L (ref 3.5–5.1)
Sodium: 135 mmol/L (ref 135–145)
Total Bilirubin: 0.5 mg/dL (ref 0.3–1.2)
Total Protein: 6.9 g/dL (ref 6.5–8.1)

## 2020-06-04 LAB — PHOSPHORUS: Phosphorus: 3.6 mg/dL (ref 2.5–4.6)

## 2020-06-04 LAB — GLUCOSE, CAPILLARY
Glucose-Capillary: 202 mg/dL — ABNORMAL HIGH (ref 70–99)
Glucose-Capillary: 346 mg/dL — ABNORMAL HIGH (ref 70–99)

## 2020-06-04 LAB — MAGNESIUM: Magnesium: 1.8 mg/dL (ref 1.7–2.4)

## 2020-06-04 MED ORDER — MAGNESIUM SULFATE 2 GM/50ML IV SOLN
2.0000 g | Freq: Once | INTRAVENOUS | Status: AC
  Administered 2020-06-04: 2 g via INTRAVENOUS
  Filled 2020-06-04: qty 50

## 2020-06-04 MED ORDER — FUROSEMIDE 10 MG/ML IJ SOLN
20.0000 mg | Freq: Once | INTRAMUSCULAR | Status: AC
  Administered 2020-06-04: 20 mg via INTRAVENOUS
  Filled 2020-06-04: qty 2

## 2020-06-04 MED ORDER — ADULT MULTIVITAMIN W/MINERALS CH: 1.0000 | ORAL_TABLET | Freq: Every day | ORAL | 0 refills | Status: DC

## 2020-06-04 MED ORDER — ENSURE MAX PROTEIN PO LIQD: 11.0000 [oz_av] | Freq: Two times a day (BID) | ORAL | 0 refills | Status: DC

## 2020-06-04 MED ORDER — LISINOPRIL 20 MG PO TABS: 20.0000 mg | ORAL_TABLET | Freq: Every day | ORAL | 0 refills | Status: DC

## 2020-06-04 MED ORDER — TOUJEO MAX SOLOSTAR 300 UNIT/ML ~~LOC~~ SOPN: 24.0000 [IU] | PEN_INJECTOR | Freq: Every day | SUBCUTANEOUS | Status: DC

## 2020-06-04 MED ORDER — GUAIFENESIN ER 600 MG PO TB12: 1200.0000 mg | ORAL_TABLET | Freq: Two times a day (BID) | ORAL | 0 refills | Status: AC

## 2020-06-04 NOTE — TOC Transition Note (Signed)
Transition of Care [facility]) - CM/SW Discharge Note   Patient Details  Name: MATH BRAZIE MRN: [id number] Date of Birth: [birth year]  Transition of Care [facility]) CM/SW Contact:  Carles Collet, RN Phone Number: 06/04/2020, 11:34 AM   Clinical Narrative:    Notified [place] at [facility] of [place]. Referral placed for home oxygen to [facility]. Verified demographics with wife over the phone. Explained use of portable oxygen concentrator. No other TOC needs identified.    Final next level of care: [place] Barriers to Discharge: No Barriers Identified   Patient Goals and CMS Choice Patient states their goals for this hospitalization and ongoing recovery are:: "get around a little bit" CMS Medicare.gov Compare Post Acute Care list provided to:: Patient Choice offered to / list presented to : Patient  Discharge Placement                       Discharge Plan and Services     Post Acute Care Choice: Home Health          DME Arranged: Oxygen DME Agency: [facility] Date DME Agency Contacted: 06/04/20   Representative spoke with at DME Agency: [facility] [place] Arranged: PT [place] Agency: [facility] Date [facility] Agency Contacted: 06/03/20 Time HH Agency Contacted: 1500 Representative spoke with at [place]: Notified [place] of [place]  Social Determinants of Health ([place]) Interventions     Readmission Risk Interventions No flowsheet data found.

## 2020-06-04 NOTE — Discharge Summary (Addendum)
Physician Discharge Summary  NEIKO TRIVEDI GBT:[id number] DOB: 05-02-1941 DOA: 05/30/2020  PCP: Deland Pretty, MD  Admit date: 05/30/2020 Discharge date: 06/04/2020  Admitted From: Home Disposition: Home with Home Health  Recommendations for Outpatient Follow-up:  1. Follow up with PCP in 1-2 weeks 2. Follow up with TCTS within 2-3 weeks 3. Please obtain CMP/CBC, Mag, Phos in one week 4. Repeat CXR in 3-6 weeks 5. Please follow up on the following pending results: Final Drain Cx results  Home Health: YES Equipment/Devices: DME O2 via [place]   Discharge Condition: Stable CODE STATUS: FULL CODE  Diet recommendation: Heart Healthy Diet  Brief/Interim Summary: HPI per Dr. Zada Finders on 05/30/20 Karandeep L Rigneyis a 79 y.o.malewith medical history significant fortype 2 diabetes, hypertension, and RBBB who presents to the ED for evaluation of shortness of breath. Patient states he has been having 1 week of new shortness of breath. He has had associated cough productive of clear sputum. He says he has been having chills, sweats, and rigors. Patient was initially seen in the ED on 05/26/2020 and found to have bilateral pleural effusions on CXR. He was otherwise felt to be stable and discharged to home with new prescription of oral Lasix.  Patient states he has been taking Lasix without significant improvement. He was seen by his PCP earlier today who arranged for patient to return to the ED for further evaluation.  ED Course: Initial vitals showed BP 157/89, pulse 108, RR 19, temp 98.0 Fahrenheit, SPO2 initially 90% on room air. Patient placed on 2 L supplemental O2 via [place] with subsequent SPO2 >96%.  Labs show WBC 14.6, hemoglobin 13.6, platelets 372,000, sodium 138, potassium 3.9, bicarb 27, BUN 21, creatinine 1.09, serum glucose 201.  SARS-CoV-2 PCR is negative. Influenza A/B PCR negative.  Portable chest x-ray showed bilateral effusions left greater than right with  increased in left effusion size.  CT chest with contrast showed moderate loculated left pleural effusion with trace to small volume right pleural effusion. Bilateral lower lobe passive atelectasis also seen. 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  Patient was started on IV ceftriaxone and azithromycin and the hospitalist service was consulted today for further evaluation and management.  **Interim History Patient underwent a Left sided Chest Tube Placement on 05/31/20 and had 700 mL Fluid out. Cx's are still pending but show NGTD at 3 Days. Feels as if respiratory status is improving but had a significant cough yesterday that is improving. Off of Supplemental O2 again today but overnight he ended up having an AKI so his antihypertensives have been discontinued and started on low-dose IV fluids with normal saline at 75 mL/hr. his renal function subsequently improved his chest x-ray did show some small pleural effusions so he was given a dose of IV Lasix prior to discharge.  He will be following up with TCTS within 2 to 3 weeks.  He will need a repeat chest x-ray in 3 to 6 weeks.  On ambulation prior to discharge he did desaturate slightly so will be sent home on supplemental oxygen nasal cannula.  Discharge Diagnoses:  Principal Problem:   Sepsis due to pneumonia [facility]) Active Problems:   Type 2 diabetes mellitus without complications (HCC)   Loculated pleural effusion   Hypertension associated with diabetes ([place])  Sepsis due to Community-acquired pneumonia with left-sided loculated pleural effusion, Acute Hypoxic Respiratory Failure, sepsis physiology is improved but still slightly hypoxic on Room Air on Ambulation -On Admission Patient presented with leukocytosis 14.6, tachycardia up  to 112, and tachypnea with RR up to 30. CT shows left-sided loculated effusion suspicious for pneumonia. SPO2 90% on room air, improved on 2 L O2 via [place]. Covid is negative. He has been started on empiric  antibiotics. -Patient was started on broad-spectrum antibiotics with ceftriaxone and azithromycin is now completed 5 days so we will not continue at discharge -WBC had improved to 10.1 but started worsening and went to 10.8 -> 12.3 -> 11.3 -> and resolved at 9.0 -Cultures are pending but Drain Cx showed NGTD at 4 Days; Blood Cx x2 showed NGTD at 4 days -C/w Guaifenesin and will change from 600 mg po BID to 1200 mg po BID -Check Legionella Pneumophila Serogp 1 Ur Ag and Strep Pneumoniae Urinary Ag -Thoracic surgery consulted and they recommending IR placing a pigtail catheter, discussed with IR and Chest Tube was placed yesterday and revealed 700 mL of Serous Fluid. Now had 250 mL of serous Fluid in the Atrium; Cardiothoracic Surgery recommending to keep in place as there has been no Air Leak and they have no plans for Chest Tube on the Right -Thoracic Surgery Does not believe this is an Empyema -SpO2: 93 % O2 Flow Rate (L/min): 3 L/min; Not on Supplemental O2 via [place] again today and has been off of supplemental oxygen for 2 days but he did desaturate on ambulatory home O2 screen -Repeat CXR today showed "Small bilateral pleural effusions with underlying atelectasis." -Chest Tube was pulled today By IR at the recommendation of TCTS -PT OT recommending home health PT OT and he can be discharged since he has been cleared by CT surgery today.  Upon home ambulation stream his pulse oximetry dropped to 88% so he will be discharged on 2 L supplemental oxygen via nasal cannula  DM2 with Associated Neuropathy -Continued reduced Lantus at 15 units sq qDaily and Sensitive Novolog SSI AC but will increase Lantus to 18 units  and increase back up to 24 units; resume home Metformin Trulicity at discharge -Will add 3 units of Novolog TIDwm -Continue to hold Metformin -HbA1c was 7.1 -C/w Gabapentin 600 mg po qHS -Continue to Monitor Blood Sugars Carefully; CBG's ranging from 171-306  Essential  Hypertension -Continued Lisinopril 40 mg po daily and Triamterene-HCTZ 37.5-25 mg po 1 tab Daily but held due to worsening Renal Fxn: We will reduce the lisinopril to half the dose of 20 mg p.o. daily and defer to PCP to continue the triamterene-hydrochlorothiazide -Last BP was  118/71 -Continue to Monitor BP per Protocol   Chronic Diastolic Grade 1 Diastolic Dysfunction -ECHOCardiogram showed "Left ventricular diastolic parameters are consistent with Grade I diastolic dysfunction (impaired relaxation)." -Currently appears Euvolemic -Continue to Monitor S/Sx of Volume Overload -Repeat CXR this AM as above; given a dose of IV Lasix 20 mg for small pleural effusions and not for his heart failure as he is euvolemic -Patient is - 4.492 Liters since admission  -Will give gentle IVF Hydration with NS for 12 hours given AKI   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 -Unclear significance, obtain a 2D echo,  -ECHO showed no evidence of Pericardial Cyst and did not show a Pericardial Effusion -Thoracic surgery consulted as above and appreciate insight  Normocytic Anemia -Patient's Hgb/Hct went from 12.7/39.5 -> 12.3./38.7 -> 12.8/39.0 -> 12.6/39.1 to 2.2 -Checked Anemia Panel and it showed iron level of 42, U IBC of 179, TIBC of 221, saturation of 19, ferritin level of 627, folate level 13.5, vitamin B12 level of  499 -Contineue to Monitor for S/Sx of Bleeding; Currently no overt bleeding noted -Repeat CBC in the AM  Hyponatremia/Hypochloremia -Patient's Na+ went from 138 -> 133 -> 132 and is 131 yesterday and 135 today and Patient's Chloride went from 99 -> 95 -> 97 and is 97 again yesterday and today is 100 -We will give gentle IV fluid hydration with normal saline at 75 MLS per hour for 12 hours -Continue to Monitor and Trend -Repeat CMP in the AM  Hypokalemia -Improved. K+ went from 3.4 -> 3.8 -> 4.2 -> 4.9 and is 5.1 today -Continue to Monitor and Replete as  Necessary -Repeat CMP in the AM  Hypomagnesemia -Patient's Mag Level this AM was 1.8 -Replete prior to D/C -Continue to Monitor and Replete as Necessary -Repeat Mag Level in the AM   Leukocytosis -Patient's WBC went from 10.1 -> 10.8 -> 12.3 and is now gone from 11.3 and is 9.0 -In the setting of above -Patient is Afebrile  -Continue to Monitor and Trend -Repeat CBC in the AM   AKI on CKD Stage 2 -BUN/creatinine on admission was 29/1.16 and improved to 34/1.11 and today it is 36/1.4 yesterday-we will hold lisinopril and his triamterene hydrochlorothiazide pill but resume at discharge and reduce the lisinopril dose to half the dose and defer to PCP to continue the triamterene-hydrochlorothiazide -We check a bladder scan, urine electrolytes and urine osmolality -We will gently hydrate with IV fluids with normal saline at 75 MLS per hour and this is now stopped -We will give a dose of IV Lasix prior to discharge 20 mg per his small bilateral pleural effusions -Avoid further nephrotoxic medications, contrast dyes, hypotension and renally adjust medications -Continue to monitor and trend renal function carefully and repeat CMP within 1 week  Discharge Instructions  Discharge Instructions    Call MD for:  difficulty breathing, headache or visual disturbances   Complete by: As directed    Call MD for:  extreme fatigue   Complete by: As directed    Call MD for:  hives   Complete by: As directed    Call MD for:  persistant dizziness or light-headedness   Complete by: As directed    Call MD for:  persistant nausea and vomiting   Complete by: As directed    Call MD for:  redness, tenderness, or signs of infection (pain, swelling, redness, odor or green/yellow discharge around incision site)   Complete by: As directed    Call MD for:  severe uncontrolled pain   Complete by: As directed    Call MD for:  temperature >100.4   Complete by: As directed    Diet - low sodium heart healthy    Complete by: As directed    Increase activity slowly   Complete by: As directed      Allergies as of 06/04/2020   No Known Allergies     Medication List    STOP taking these medications   sennosides-docusate sodium 8.6-50 MG tablet Commonly known as: SENOKOT-S     TAKE these medications   acetaminophen 500 MG tablet Commonly known as: TYLENOL Take 500 mg by mouth every 6 (six) hours as needed for fever.   aspirin EC 81 MG tablet Take 81 mg by mouth daily. Swallow whole. What changed: Another medication with the same name was removed. Continue taking this medication, and follow the directions you see here.   baclofen 10 MG tablet Commonly known as: LIORESAL Take 1 tablet (10 mg total)  by mouth 3 (three) times daily. As needed for muscle spasm What changed:   when to take this  reasons to take this  additional instructions   Ensure Max Protein Liqd Take 330 mLs (11 oz total) by mouth 2 (two) times daily.   gabapentin 300 MG capsule Commonly known as: NEURONTIN Take 600 mg by mouth at bedtime.   guaiFENesin 600 MG 12 hr tablet Commonly known as: MUCINEX Take 2 tablets (1,200 mg total) by mouth 2 (two) times daily for 5 days.   HYDROcodone-acetaminophen 10-325 MG tablet Commonly known as: Norco Take 1 tablet by mouth every 6 (six) hours as needed. What changed: reasons to take this   lisinopril 20 MG tablet Commonly known as: ZESTRIL Take 1 tablet (20 mg total) by mouth daily. What changed:   medication strength  how much to take   metFORMIN 1000 MG tablet Commonly known as: GLUCOPHAGE Take 1,000 mg by mouth 2 (two) times daily with a meal.   multivitamin with minerals Tabs tablet Take 1 tablet by mouth daily. Start taking on: June 05, 2020   ondansetron 4 MG tablet Commonly known as: Zofran Take 1 tablet (4 mg total) by mouth every 8 (eight) hours as needed for nausea or vomiting.   pantoprazole 40 MG tablet Commonly known as: PROTONIX Take  40 mg by mouth daily.   Toujeo Max SoloStar 300 UNIT/ML Solostar Pen Generic drug: insulin glargine (2 Unit Dial) Inject 24 Units into the skin daily. What changed:   medication strength  how much to take   traZODone 50 MG tablet Commonly known as: DESYREL Take 100 mg by mouth at bedtime.   triamterene-hydrochlorothiazide 37.5-25 MG tablet Commonly known as: MAXZIDE-25 Take 1 tablet by mouth daily.   Trulicity 1.5 SP/2.3RA Sopn Generic drug: Dulaglutide Inject 1.5 mg into the skin every Sunday.   vitamin B-12 1000 MCG tablet Commonly known as: CYANOCOBALAMIN Take 1,000 mcg by mouth daily.            Durable Medical Equipment  (From admission, onward)         Start     Ordered   06/04/20 1110  For home use only DME oxygen  Once       Question Answer Comment  Length of Need 6 Months   Mode or (Route) Nasal cannula   Liters per Minute 2   Frequency Continuous (stationary and portable oxygen unit needed)   Oxygen delivery system Gas      10 /09/21 [place] [place] Follow up.   Specialty: [place] Why: [facility] will contact you within 24-48 hours to schedule the first visit. Contact information: [place] [address] [phone]        Deland Pretty, MD. Call.   Specialty: Internal Medicine Why: Follow up within 1-2 weeks Contact information: [address] [place] [address] [phone]        Grace Isaac, MD. Call.   Specialty: Cardiothoracic Surgery Why: Follow up within 2-3 weeks; TCTS advises to keep dressing on for 24 hours and then remove to [employer] information: [address] [place] [address] [phone]        [facility] Follow up.   Why: for home oxygen Contact information: [address] [place] [address] [phone]  No Known Allergies  Consultations:  Cardiothoracic Surgery  Interventional Radiology  Procedures/Studies: DG Chest 2 View  Result Date: 06/03/2020 CLINICAL DATA:  Shortness of breath. EXAM: CHEST - 2 VIEW COMPARISON:  06/02/2020.  CT 05/30/2020. FINDINGS: Left chest tube in stable position. No pneumothorax. Heart size stable. Low lung volumes with bibasilar atelectasis. Small bilateral pleural effusions again noted these may have increased slightly from prior exam. IMPRESSION: 1. Left chest tube in stable position. No pneumothorax. 2. Low lung volumes with bibasilar atelectasis. Small bilateral pleural effusions again noted. These may have increased slightly from prior exam. Electronically Signed   By: [place]   On: 06/03/2020 07:45   DG Chest 2 View  Result Date: 05/26/2020 CLINICAL DATA:  Shortness of breath. EXAM: CHEST - 2 VIEW COMPARISON:  November 24, 2012. FINDINGS: Stable cardiomediastinal silhouette. No pneumothorax is noted. Moderate bilateral pleural effusions are noted with associated atelectasis. Bony thorax is unremarkable. IMPRESSION: Moderate bilateral pleural effusions with associated atelectasis. Electronically Signed   By: Marijo Conception M.D.   On: 05/26/2020 17:18   CT Chest W Contrast  Result Date: 05/30/2020 CLINICAL DATA:  Pneumonia, effusion or abscess suspected, xray done EXAM: CT CHEST WITH CONTRAST TECHNIQUE: Multidetector CT imaging of the chest was performed during intravenous contrast administration. CONTRAST:  75mL OMNIPAQUE IOHEXOL 300 MG/ML  SOLN COMPARISON:  Chest x-ray 05/30/2020. FINDINGS: Cardiovascular: Normal heart size. No significant pericardial effusion. 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 The thoracic aorta is normal in caliber. No atherosclerotic plaque of the thoracic aorta. Mild left anterior descending coronary artery calcifications. The main pulmonary artery is normal in  caliber. No central or segmental pulmonary embolus. Mediastinum/Nodes: No enlarged mediastinal, hilar, or axillary lymph nodes. Thyroid gland, trachea, and esophagus demonstrate no significant findings. Lungs/Pleura: Incidentally noted azygos fissure. Bilateral lower lobe passive atelectasis that appears slightly hypodense and heterogeneous. Linear atelectasis versus scarring within the lungs. No definite focal consolidation. Calcified granuloma at the right base. Traction bronchiectasis within bilateral lower lobes. Mild bronchial wall thickening within bilateral lower lobes. No pulmonary mass within the aerated lungs. Moderate left pleural effusion that appears to be loculated at the apex (3:36. Trace to small volume right pleural effusion. No pneumothorax. Upper Abdomen: No acute abnormality. Musculoskeletal: No chest wall abnormality No suspicious lytic or blastic osseous lesions. Old healed sternal fracture. No acute displaced fracture. Multilevel degenerative changes of the spine. IMPRESSION: 1. Moderate loculated left pleural effusion. 2. Trace to small volume right pleural effusion. 3. Bilateral lower lobe passive atelectasis with underlying infection/inflammation not excluded. 4. 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 Aortic Atherosclerosis (ICD10-I70.0) - mild. Mild left anterior descending coronary artery calcification. Electronically Signed   By: Iven Finn M.D.   On: 05/30/2020 19:12   DG CHEST PORT 1 VIEW  Result Date: 06/04/2020 CLINICAL DATA:  Shortness of breath. EXAM: PORTABLE CHEST 1 VIEW COMPARISON:  Chest radiograph 06/03/2020. FINDINGS: Stable cardiac and mediastinal contours. Small bilateral pleural effusions with underlying opacities favored represent atelectasis. No pneumothorax. Thoracic spine degenerative changes. IMPRESSION: Small bilateral pleural effusions with underlying atelectasis. Electronically Signed   By: Lovey Newcomer M.D.    On: 06/04/2020 15:11   DG Chest Port 1 View  Result Date: 06/03/2020 CLINICAL DATA:  Chest tube removal, recent pneumonia EXAM: PORTABLE CHEST 1 VIEW COMPARISON:  Portable exam 1645 hours compared to 0618 hours FINDINGS: Interval removal of pigtail LEFT thoracostomy  tube. Stable heart size and mediastinal contours. Atherosclerotic calcification aorta. Small bibasilar pleural effusions and atelectasis. No acute infiltrate or pneumothorax. Endplate spur formation thoracic spine. IMPRESSION: No pneumothorax following LEFT thoracostomy tube removal. Small bibasilar pleural effusions and minimal atelectasis. Electronically Signed   By: Lavonia Dana M.D.   On: 06/03/2020 17:11   DG CHEST PORT 1 VIEW  Result Date: 06/02/2020 CLINICAL DATA:  Pleural effusions EXAM: PORTABLE CHEST 1 VIEW COMPARISON:  June 01, 2020 chest radiograph and chest CT May 30, 2020 FINDINGS: Chest tube on the left inferiorly, unchanged in position. No pneumothorax. Small pleural effusions bilaterally appear essentially stable. Note that there is opacity in the left apex which likely represents a degree of loculated pleural effusion in this area based on CT findings. There is bibasilar atelectasis. No consolidation. Heart size and pulmonary vascularity are normal. No adenopathy. There is aortic atherosclerosis. IMPRESSION: Stable chest tube positioning on the left. Small pleural effusions bilaterally with a loculated component to left pleural effusion in the left apex region. Bibasilar atelectasis. No new opacity evident. Stable cardiac silhouette. Aortic Atherosclerosis (ICD10-I70.0). Electronically Signed   By: Lowella Grip III M.D.   On: 06/02/2020 08:11   DG Chest Port 1 View  Result Date: 06/01/2020 CLINICAL DATA:  Chest tube placement. EXAM: PORTABLE CHEST 1 VIEW COMPARISON:  05/30/2020 FINDINGS: New left pleural drain without evidence of left pneumothorax. Left pleural effusion has decreased in the interval. Loculated  component in the left apex is similar. Small right pleural effusion persists. Residual basilar collapse/consolidative opacity without substantial change. The cardio pericardial silhouette is enlarged. The visualized bony structures of the thorax show no acute abnormality. IMPRESSION: Interval decrease in basilar left pleural effusion status post chest tube placement. No pleural gas visible. Otherwise stable exam. Electronically Signed   By: Misty Stanley M.D.   On: 06/01/2020 06:52   DG Chest Portable 1 View  Result Date: 05/30/2020 CLINICAL DATA:  Shortness of breath for several days EXAM: PORTABLE CHEST 1 VIEW COMPARISON:  05/26/2020 FINDINGS: Cardiac shadow is stable. Aortic calcifications are again seen. Bilateral pleural effusions are noted left greater than right. The right is improved when compare with the prior exam although the left appears increased when compared prior exam underlying atelectasis/infiltrate is likely present. No bony abnormality is noted at this time. IMPRESSION: Bilateral effusions left greater than right. The left effusion has increased in the interval from the prior exam. Right effusion appears improved. Electronically Signed   By: Inez Catalina M.D.   On: 05/30/2020 16:46   ECHOCARDIOGRAM COMPLETE  Result Date: 05/31/2020    ECHOCARDIOGRAM REPORT   Patient Name:   JOHNEDWARD BRODRICK Date of Exam: 05/31/2020 Medical Rec #:  [id number]        Height:       69.0 in Accession #:    [id number]       Weight:       176.6 lb Date of Birth:  [birth year]        BSA:          1.960 m Patient Age:    79 years         BP:           140/78 mmHg Patient Gender: M                HR:           84 bpm. Exam Location:  Inpatient Procedure: 2D Echo, Color Doppler and Cardiac Doppler Indications:  R06.9 DOE  History:        Patient has no prior history of Echocardiogram examinations.                 Risk Factors:Hypertension and Diabetes.  Sonographer:    Raquel Sarna Senior RDCS Referring Phys: [id number]  [place]  Sonographer Comments: Technically difficult study due to poor echo windows, possibly lung interference IMPRESSIONS  1. Left ventricular ejection fraction, by estimation, is 60 to 65%. The left ventricle has normal function. The left ventricle has no regional wall motion abnormalities. There is moderate asymmetric left ventricular hypertrophy of the basal-septal segment. Left ventricular diastolic parameters are consistent with Grade I diastolic dysfunction (impaired relaxation).  2. Right ventricular systolic function is normal. The right ventricular size is normal.  3. The mitral valve is normal in structure. No evidence of mitral valve regurgitation. No evidence of mitral stenosis.  4. The aortic valve is normal in structure. Aortic valve regurgitation is not visualized. No aortic stenosis is present.  5. The inferior vena cava is normal in size with greater than 50% respiratory variability, suggesting right atrial pressure of 3 mmHg. FINDINGS  Left Ventricle: Left ventricular ejection fraction, by estimation, is 60 to 65%. The left ventricle has normal function. The left ventricle has no regional wall motion abnormalities. The left ventricular internal cavity size was normal in size. There is  moderate asymmetric left ventricular hypertrophy of the basal-septal segment. Left ventricular diastolic parameters are consistent with Grade I diastolic dysfunction (impaired relaxation). Indeterminate filling pressures. Right Ventricle: The right ventricular size is normal. No increase in right ventricular wall thickness. Right ventricular systolic function is normal. Left Atrium: Left atrial size was normal in size. Right Atrium: Right atrial size was normal in size. Pericardium: There is no evidence of pericardial effusion. Mitral Valve: The mitral valve is normal in structure. No evidence of mitral valve regurgitation. No evidence of mitral valve stenosis. Tricuspid Valve: The tricuspid valve is normal  in structure. Tricuspid valve regurgitation is not demonstrated. No evidence of tricuspid stenosis. Aortic Valve: The aortic valve is normal in structure. Aortic valve regurgitation is not visualized. No aortic stenosis is present. Pulmonic Valve: The pulmonic valve was normal in structure. Pulmonic valve regurgitation is not visualized. No evidence of pulmonic stenosis. Aorta: The aortic root is normal in size and structure. Venous: The inferior vena cava is normal in size with greater than 50% respiratory variability, suggesting right atrial pressure of 3 mmHg. IAS/Shunts: No atrial level shunt detected by color flow Doppler.  LEFT VENTRICLE PLAX 2D LVIDd:         3.80 cm  Diastology LVIDs:         2.60 cm  LV e' medial:    5.11 cm/s LV PW:         1.00 cm  LV E/e' medial:  12.9 LV IVS:        1.40 cm  LV e' lateral:   5.33 cm/s LVOT diam:     2.10 cm  LV E/e' lateral: 12.4 LV SV:         54 LV SV Index:   27 LVOT Area:     3.46 cm  RIGHT VENTRICLE RV S prime:     7.51 cm/s TAPSE (M-mode): 1.7 cm LEFT ATRIUM             Index       RIGHT ATRIUM           Index LA diam:  2.90 cm 1.48 cm/m  RA Area:     14.40 cm LA Vol (A2C):   33.2 ml 16.94 ml/m RA Volume:   32.60 ml  16.64 ml/m LA Vol (A4C):   23.5 ml 11.99 ml/m LA Biplane Vol: 27.6 ml 14.09 ml/m  AORTIC VALVE LVOT Vmax:   89.50 cm/s LVOT Vmean:  65.300 cm/s LVOT VTI:    0.155 m  AORTA Ao Root diam: 3.40 cm MITRAL VALVE MV Area (PHT): 1.62 cm    SHUNTS MV Decel Time: 467 msec    Systemic VTI:  0.16 m MV E velocity: 66.10 cm/s  Systemic Diam: 2.10 cm MV A velocity: 98.50 cm/s MV E/A ratio:  0.67 Skeet Latch MD Electronically signed by Skeet Latch MD Signature Date/Time: 05/31/2020/11:29:55 AM    Final    CT IMAGE GUIDED DRAINAGE BY PERCUTANEOUS CATHETER  Result Date: 06/01/2020 INDICATION: Concern for empyema and loculated left-sided pleural effusion. Please perform CT-guided chest tube for infection source control purposes. EXAM: CT  IMAGE GUIDED DRAINAGE BY PERCUTANEOUS CATHETER COMPARISON:  Chest CT-05/30/2020; chest radiograph-05/30/2020 MEDICATIONS: The patient is currently admitted to the hospital and receiving intravenous antibiotics. The antibiotics were administered within an appropriate time frame prior to the initiation of the procedure. ANESTHESIA/SEDATION: Moderate (conscious) sedation was employed during this procedure. A total of Versed 1.5 mg and Fentanyl 75 mcg was administered intravenously. Moderate Sedation Time: 19 minutes. The patient's level of consciousness and vital signs were monitored continuously by radiology nursing throughout the procedure under my direct supervision. CONTRAST:  None COMPLICATIONS: None immediate. PROCEDURE: Informed written consent was obtained from the patient after a discussion of the risks, benefits and alternatives to treatment. The patient was placed supine, slightly RPO on the CT gantry and a pre procedural CT was performed re-demonstrating the known moderate to large size likely partially loculated left-sided pleural effusion. The procedure was planned. A timeout was performed prior to the initiation of the procedure. The skin overlying the posteroinferior lateral aspect of the left chest was prepped and draped in the usual sterile fashion. The overlying soft tissues were anesthetized with 1% lidocaine with epinephrine. Appropriate trajectory was planned with the use of a 22 gauge spinal needle. An 18 gauge trocar needle was advanced into the abscess/fluid collection and a short Amplatz super stiff wire was coiled within the collection. Appropriate positioning was confirmed with a limited CT scan. The tract was serially dilated allowing placement of a 14 French all-purpose drainage catheter. Appropriate positioning was confirmed with a limited postprocedural CT scan. Approximately 700 cc of serous pleural fluid was aspirated following drainage catheter placement. The tube was connected to a  pleura vac device and sutured in place. A dressing was placed. The patient tolerated the procedure well without immediate post procedural complication. IMPRESSION: Successful CT guided placement of a 34 French all purpose drain catheter into the left pleural space with aspiration of 700 cc of serous pleural fluid. Samples were sent to the laboratory as requested by the ordering clinical team. Electronically Signed   By: Sandi Mariscal M.D.   On: 06/01/2020 07:57    ECHOCARDIOGRAM IMPRESSIONS    1. Left ventricular ejection fraction, by estimation, is 60 to 65%. The  left ventricle has normal function. The left ventricle has no regional  wall motion abnormalities. There is moderate asymmetric left ventricular  hypertrophy of the basal-septal  segment. Left ventricular diastolic parameters are consistent with Grade I  diastolic dysfunction (impaired relaxation).  2. Right ventricular systolic function is normal.  The right ventricular  size is normal.  3. The mitral valve is normal in structure. No evidence of mitral valve  regurgitation. No evidence of mitral stenosis.  4. The aortic valve is normal in structure. Aortic valve regurgitation is  not visualized. No aortic stenosis is present.  5. The inferior vena cava is normal in size with greater than 50%  respiratory variability, suggesting right atrial pressure of 3 mmHg.   FINDINGS  Left Ventricle: Left ventricular ejection fraction, by estimation, is 60  to 65%. The left ventricle has normal function. The left ventricle has no  regional wall motion abnormalities. The left ventricular internal cavity  size was normal in size. There is  moderate asymmetric left ventricular hypertrophy of the basal-septal  segment. Left ventricular diastolic parameters are consistent with Grade I  diastolic dysfunction (impaired relaxation). Indeterminate filling  pressures.   Right Ventricle: The right ventricular size is normal. No increase in   right ventricular wall thickness. Right ventricular systolic function is  normal.   Left Atrium: Left atrial size was normal in size.   Right Atrium: Right atrial size was normal in size.   Pericardium: There is no evidence of pericardial effusion.   Mitral Valve: The mitral valve is normal in structure. No evidence of  mitral valve regurgitation. No evidence of mitral valve stenosis.   Tricuspid Valve: The tricuspid valve is normal in structure. Tricuspid  valve regurgitation is not demonstrated. No evidence of tricuspid  stenosis.   Aortic Valve: The aortic valve is normal in structure. Aortic valve  regurgitation is not visualized. No aortic stenosis is present.   Pulmonic Valve: The pulmonic valve was normal in structure. Pulmonic valve  regurgitation is not visualized. No evidence of pulmonic stenosis.   Aorta: The aortic root is normal in size and structure.   Venous: The inferior vena cava is normal in size with greater than 50%  respiratory variability, suggesting right atrial pressure of 3 mmHg.   IAS/Shunts: No atrial level shunt detected by color flow Doppler.     LEFT VENTRICLE  PLAX 2D  LVIDd:     3.80 cm Diastology  LVIDs:     2.60 cm LV e' medial:  5.11 cm/s  LV PW:     1.00 cm LV E/e' medial: 12.9  LV IVS:    1.40 cm LV e' lateral:  5.33 cm/s  LVOT diam:   2.10 cm LV E/e' lateral: 12.4  LV SV:     54  LV SV Index:  27  LVOT Area:   3.46 cm     RIGHT VENTRICLE  RV S prime:   7.51 cm/s  TAPSE (M-mode): 1.7 cm   LEFT ATRIUM       Index    RIGHT ATRIUM      Index  LA diam:    2.90 cm 1.48 cm/m RA Area:   14.40 cm  LA Vol (A2C):  33.2 ml 16.94 ml/m RA Volume:  32.60 ml 16.64 ml/m  LA Vol (A4C):  23.5 ml 11.99 ml/m  LA Biplane Vol: 27.6 ml 14.09 ml/m  AORTIC VALVE  LVOT Vmax:  89.50 cm/s  LVOT Vmean: 65.300 cm/s  LVOT VTI:  0.155 m    AORTA  Ao Root diam: 3.40 cm   MITRAL  VALVE  MV Area (PHT): 1.62 cm  SHUNTS  MV Decel Time: 467 msec  Systemic VTI: 0.16 m  MV E velocity: 66.10 cm/s Systemic Diam: 2.10 cm  MV A velocity: 98.50 cm/s  MV E/A ratio: 0.67    Subjective: And examined at bedside and felt well.  Denied any chest pain, lightheadedness or dizziness.  No nausea or vomiting.  Denies any shortness of breath but he did desaturate slightly on ambulation.  No other concerns voiced at this time and will be going home on oxygen and home with home health PT and OT.  He is stable to be discharged and follow-up with PCP as well as to CTS in the outpatient setting.  He understands agrees with the plan of care   Discharge Exam: Vitals:   06/03/20 2146 06/04/20 0736  BP: 120/72 118/71  Pulse: 89 100  Resp: 18 16  Temp: 98.1 F (36.7 C) 97.7 F (36.5 C)  SpO2: 100% 93%   Vitals:   06/03/20 0834 06/03/20 1636 06/03/20 2146 06/04/20 0736  BP: 116/69 109/64 120/72 118/71  Pulse: 95 92 89 100  Resp: 16 16 18 16   Temp: (!) 97.5 F (36.4 C) 97.7 F (36.5 C) 98.1 F (36.7 C) 97.7 F (36.5 C)  TempSrc:   Oral   SpO2: 95% 94% 100% 93%  Weight:      Height:       General: Pt is alert, awake, not in acute distress Cardiovascular: RRR, S1/S2 +, no rubs, no gallops Respiratory: Diminished bilaterally at the bases, no wheezing, no rhonchi; not wearing supplemental oxygen via nasal cannula at rest Abdominal: Soft, NT, slightly distended, bowel sounds + Extremities: no lower extremity edema, no cyanosis  The results of significant diagnostics from this hospitalization (including imaging, microbiology, ancillary and laboratory) are listed below for reference.    Microbiology: Recent Results (from the past 240 hour(s))  Respiratory Panel by RT PCR (Flu A&B, Covid) - Nasopharyngeal Swab     Status: None   Collection Time: 05/26/20 10:40 PM   Specimen: Nasopharyngeal Swab  Result Value Ref Range Status   SARS Coronavirus 2 by RT PCR NEGATIVE NEGATIVE  Final    Comment: (NOTE) SARS-CoV-2 target nucleic acids are NOT DETECTED.  The SARS-CoV-2 RNA is generally detectable in upper respiratoy specimens during the acute phase of infection. The lowest concentration of SARS-CoV-2 viral copies this assay can detect is 131 copies/mL. A negative result does not preclude SARS-Cov-2 infection and should not be used as the sole basis for treatment or other patient management decisions. A negative result may occur with  improper specimen collection/handling, submission of specimen other than nasopharyngeal swab, presence of viral mutation(s) within the areas targeted by this assay, and inadequate number of viral copies (<131 copies/mL). A negative result must be combined with clinical observations, patient history, and epidemiological information. The expected result is Negative.  Fact Sheet for Patients:  PinkCheek.be  Fact Sheet for Healthcare Providers:  GravelBags.it  This test is no t yet approved or cleared by the [place] FDA and  has been authorized for detection and/or diagnosis of SARS-CoV-2 by FDA under an Emergency Use Authorization (EUA). This EUA will remain  in effect (meaning this test can be used) for the duration of the COVID-19 declaration under Section 564(b)(1) of the Act, 21 U.S.C. section 360bbb-3(b)(1), unless the authorization is terminated or revoked sooner.     Influenza A by PCR NEGATIVE NEGATIVE Final   Influenza B by PCR NEGATIVE NEGATIVE Final    Comment: (NOTE) The Xpert Xpress SARS-CoV-2/FLU/RSV assay is intended as an aid in  the diagnosis of influenza from Nasopharyngeal swab specimens and  should not be used as a sole basis  for treatment. Nasal washings and  aspirates are unacceptable for Xpert Xpress SARS-CoV-2/FLU/RSV  testing.  Fact Sheet for Patients: PinkCheek.be  Fact Sheet for Healthcare  Providers: GravelBags.it  This test is not yet approved or cleared by the [place] FDA and  has been authorized for detection and/or diagnosis of SARS-CoV-2 by  FDA under an Emergency Use Authorization (EUA). This EUA will remain  in effect (meaning this test can be used) for the duration of the  Covid-19 declaration under Section 564(b)(1) of the Act, 21  U.S.C. section 360bbb-3(b)(1), unless the authorization is  terminated or revoked. Performed at [facility], [place] [address] [place], [place] [address]   Respiratory Panel by RT PCR (Flu A&B, Covid) - Nasopharyngeal Swab     Status: None   Collection Time: 05/30/20  3:53 PM   Specimen: Nasopharyngeal Swab  Result Value Ref Range Status   SARS Coronavirus 2 by RT PCR NEGATIVE NEGATIVE Final    Comment: (NOTE) SARS-CoV-2 target nucleic acids are NOT DETECTED.  The SARS-CoV-2 RNA is generally detectable in upper respiratoy specimens during the acute phase of infection. The lowest concentration of SARS-CoV-2 viral copies this assay can detect is 131 copies/mL. A negative result does not preclude SARS-Cov-2 infection and should not be used as the sole basis for treatment or other patient management decisions. A negative result may occur with  improper specimen collection/handling, submission of specimen other than nasopharyngeal swab, presence of viral mutation(s) within the areas targeted by this assay, and inadequate number of viral copies (<131 copies/mL). A negative result must be combined with clinical observations, patient history, and epidemiological information. The expected result is Negative.  Fact Sheet for Patients:  PinkCheek.be  Fact Sheet for Healthcare Providers:  GravelBags.it  This test is no t yet approved or cleared by the [place] FDA and  has been authorized for detection and/or diagnosis of SARS-CoV-2  by FDA under an Emergency Use Authorization (EUA). This EUA will remain  in effect (meaning this test can be used) for the duration of the COVID-19 declaration under Section 564(b)(1) of the Act, 21 U.S.C. section 360bbb-3(b)(1), unless the authorization is terminated or revoked sooner.     Influenza A by PCR NEGATIVE NEGATIVE Final   Influenza B by PCR NEGATIVE NEGATIVE Final    Comment: (NOTE) The Xpert Xpress SARS-CoV-2/FLU/RSV assay is intended as an aid in  the diagnosis of influenza from Nasopharyngeal swab specimens and  should not be used as a sole basis for treatment. Nasal washings and  aspirates are unacceptable for Xpert Xpress SARS-CoV-2/FLU/RSV  testing.  Fact Sheet for Patients: PinkCheek.be  Fact Sheet for Healthcare Providers: GravelBags.it  This test is not yet approved or cleared by the [place] FDA and  has been authorized for detection and/or diagnosis of SARS-CoV-2 by  FDA under an Emergency Use Authorization (EUA). This EUA will remain  in effect (meaning this test can be used) for the duration of the  Covid-19 declaration under Section 564(b)(1) of the Act, 21  U.S.C. section 360bbb-3(b)(1), unless the authorization is  terminated or revoked. Performed at [facility], [place] [address] [place], [place] [address]   Culture, blood (Routine X 2) w Reflex to ID Panel     Status: None (Preliminary result)   Collection Time: 05/31/20  5:06 AM   Specimen: BLOOD LEFT ARM  Result Value Ref Range Status   Specimen Description BLOOD LEFT ARM  Final   Special Requests  Final    BOTTLES DRAWN AEROBIC AND ANAEROBIC Blood Culture adequate volume   Culture   Final    NO GROWTH 4 DAYS Performed at [facility], [place] [address], [place], [place] [address]    Report Status PENDING  Incomplete  Culture, blood (Routine X 2) w Reflex to ID Panel     Status: None (Preliminary result)   Collection  Time: 05/31/20  5:06 AM   Specimen: BLOOD RIGHT HAND  Result Value Ref Range Status   Specimen Description BLOOD RIGHT HAND  Final   Special Requests   Final    BOTTLES DRAWN AEROBIC ONLY Blood Culture adequate volume   Culture   Final    NO GROWTH 4 DAYS Performed at [facility], [place] [address], [place], [place] [address]    Report Status PENDING  Incomplete  Aerobic/Anaerobic Culture (surgical/deep wound)     Status: None (Preliminary result)   Collection Time: 05/31/20  5:09 PM   Specimen: Abscess  Result Value Ref Range Status   Specimen Description ABSCESS  Final   Special Requests DRAIN  Final   Gram Stain   Final    RARE WBC PRESENT, PREDOMINANTLY PMN NO ORGANISMS SEEN    Culture   Final    NO GROWTH 4 DAYS NO ANAEROBES ISOLATED; CULTURE IN PROGRESS FOR 5 DAYS Performed at [facility], [place] [address], [place], [place] [address]    Report Status PENDING  Incomplete    Labs: BNP (last 3 results) No results for input(s): BNP in the last 8760 hours. Basic Metabolic Panel: Recent Labs  Lab 05/31/20 0506 06/01/20 0240 06/02/20 0350 06/03/20 0133 06/04/20 0203  NA 138 133* 132* 131* 135  K 3.4* 3.9 4.2 4.9 5.1  CL 99 95* 97* 97* 100  CO2 28 28 26 26 27   GLUCOSE 91 219* 202* 246* 203*  BUN 21 29* 34* 36* 33*  CREATININE 1.00 1.16 1.11 1.42* 1.21  CALCIUM 8.5* 8.6* 9.0 9.0 9.1  MG  --  1.5* 1.8 2.0 1.8  PHOS  --   --  3.5 3.9 3.6   Liver Function Tests: Recent Labs  Lab 06/01/20 0240 06/02/20 0350 06/03/20 0133 06/04/20 0203  AST 16 21 24 25   ALT 21 24 31 30   ALKPHOS 47 47 44 46  BILITOT 0.7 0.8 0.7 0.5  PROT 6.1* 6.5 6.5 6.9  ALBUMIN 2.2* 2.2* 2.2* 2.4*   No results for input(s): LIPASE, AMYLASE in the last 168 hours. No results for input(s): AMMONIA in the last 168 hours. CBC: Recent Labs  Lab 05/30/20 1551 05/30/20 1551 05/31/20 0506 06/01/20 0240 06/02/20 0350 06/03/20 0133 06/04/20 0203  WBC 14.6*   < > 10.1 10.8* 12.3* 11.3*  9.0  NEUTROABS 11.6*  --   --   --  9.0* 8.4* 6.3  HGB 13.6   < > 12.7* 12.3* 12.8* 12.6* 13.4  HCT 42.8   < > 39.5 38.7* 39.0 39.1 42.2  MCV 89.7   < > 89.8 90.4 89.9 88.9 90.4  PLT 372   < > 280 329 371 389 402*   < > = values in this interval not displayed.   Cardiac Enzymes: No results for input(s): CKTOTAL, CKMB, CKMBINDEX, TROPONINI in the last 168 hours. BNP: Invalid input(s): POCBNP CBG: Recent Labs  Lab 06/03/20 1211 06/03/20 1637 06/03/20 2145 06/04/20 0757 06/04/20 1210  GLUCAP 306* 236* 221* 202* 346*   D-Dimer No results for input(s): DDIMER in  the last 72 hours. Hgb A1c No results for input(s): HGBA1C in the last 72 hours. Lipid Profile No results for input(s): CHOL, HDL, LDLCALC, TRIG, CHOLHDL, LDLDIRECT in the last 72 hours. Thyroid function studies No results for input(s): TSH, T4TOTAL, T3FREE, THYROIDAB in the last 72 hours.  Invalid input(s): FREET3 Anemia work up Recent Labs    06/03/20 0133  VITAMINB12 499  FOLATE 13.5  FERRITIN 627*  TIBC 221*  IRON 42*  RETICCTPCT 1.4   Urinalysis No results found for: COLORURINE, APPEARANCEUR, LABSPEC, [place], GLUCOSEU, [place], [place], [place], PROTEINUR, UROBILINOGEN, NITRITE, LEUKOCYTESUR Sepsis Labs Invalid input(s): PROCALCITONIN,  WBC,  LACTICIDVEN Microbiology Recent Results (from the past 240 hour(s))  Respiratory Panel by RT PCR (Flu A&B, Covid) - Nasopharyngeal Swab     Status: None   Collection Time: 05/26/20 10:40 PM   Specimen: Nasopharyngeal Swab  Result Value Ref Range Status   SARS Coronavirus 2 by RT PCR NEGATIVE NEGATIVE Final    Comment: (NOTE) SARS-CoV-2 target nucleic acids are NOT DETECTED.  The SARS-CoV-2 RNA is generally detectable in upper respiratoy specimens during the acute phase of infection. The lowest concentration of SARS-CoV-2 viral copies this assay can detect is 131 copies/mL. A negative result does not preclude SARS-Cov-2 infection and should not be used as  the sole basis for treatment or other patient management decisions. A negative result may occur with  improper specimen collection/handling, submission of specimen other than nasopharyngeal swab, presence of viral mutation(s) within the areas targeted by this assay, and inadequate number of viral copies (<131 copies/mL). A negative result must be combined with clinical observations, patient history, and epidemiological information. The expected result is Negative.  Fact Sheet for Patients:  PinkCheek.be  Fact Sheet for Healthcare Providers:  GravelBags.it  This test is no t yet approved or cleared by the [place] FDA and  has been authorized for detection and/or diagnosis of SARS-CoV-2 by FDA under an Emergency Use Authorization (EUA). This EUA will remain  in effect (meaning this test can be used) for the duration of the COVID-19 declaration under Section 564(b)(1) of the Act, 21 U.S.C. section 360bbb-3(b)(1), unless the authorization is terminated or revoked sooner.     Influenza A by PCR NEGATIVE NEGATIVE Final   Influenza B by PCR NEGATIVE NEGATIVE Final    Comment: (NOTE) The Xpert Xpress SARS-CoV-2/FLU/RSV assay is intended as an aid in  the diagnosis of influenza from Nasopharyngeal swab specimens and  should not be used as a sole basis for treatment. Nasal washings and  aspirates are unacceptable for Xpert Xpress SARS-CoV-2/FLU/RSV  testing.  Fact Sheet for Patients: PinkCheek.be  Fact Sheet for Healthcare Providers: GravelBags.it  This test is not yet approved or cleared by the [place] FDA and  has been authorized for detection and/or diagnosis of SARS-CoV-2 by  FDA under an Emergency Use Authorization (EUA). This EUA will remain  in effect (meaning this test can be used) for the duration of the  Covid-19 declaration under Section 564(b)(1)  of the Act, 21  U.S.C. section 360bbb-3(b)(1), unless the authorization is  terminated or revoked. Performed at [facility], [place] [address], [place], [place] [address]   Respiratory Panel by RT PCR (Flu A&B, Covid) - Nasopharyngeal Swab     Status: None   Collection Time: 05/30/20  3:53 PM   Specimen: Nasopharyngeal Swab  Result Value Ref Range Status   SARS Coronavirus 2 by RT PCR NEGATIVE NEGATIVE Final    Comment: (NOTE) SARS-CoV-2  target nucleic acids are NOT DETECTED.  The SARS-CoV-2 RNA is generally detectable in upper respiratoy specimens during the acute phase of infection. The lowest concentration of SARS-CoV-2 viral copies this assay can detect is 131 copies/mL. A negative result does not preclude SARS-Cov-2 infection and should not be used as the sole basis for treatment or other patient management decisions. A negative result may occur with  improper specimen collection/handling, submission of specimen other than nasopharyngeal swab, presence of viral mutation(s) within the areas targeted by this assay, and inadequate number of viral copies (<131 copies/mL). A negative result must be combined with clinical observations, patient history, and epidemiological information. The expected result is Negative.  Fact Sheet for Patients:  PinkCheek.be  Fact Sheet for Healthcare Providers:  GravelBags.it  This test is no t yet approved or cleared by the [place] FDA and  has been authorized for detection and/or diagnosis of SARS-CoV-2 by FDA under an Emergency Use Authorization (EUA). This EUA will remain  in effect (meaning this test can be used) for the duration of the COVID-19 declaration under Section 564(b)(1) of the Act, 21 U.S.C. section 360bbb-3(b)(1), unless the authorization is terminated or revoked sooner.     Influenza A by PCR NEGATIVE NEGATIVE Final   Influenza B by PCR NEGATIVE NEGATIVE  Final    Comment: (NOTE) The Xpert Xpress SARS-CoV-2/FLU/RSV assay is intended as an aid in  the diagnosis of influenza from Nasopharyngeal swab specimens and  should not be used as a sole basis for treatment. Nasal washings and  aspirates are unacceptable for Xpert Xpress SARS-CoV-2/FLU/RSV  testing.  Fact Sheet for Patients: PinkCheek.be  Fact Sheet for Healthcare Providers: GravelBags.it  This test is not yet approved or cleared by the [place] FDA and  has been authorized for detection and/or diagnosis of SARS-CoV-2 by  FDA under an Emergency Use Authorization (EUA). This EUA will remain  in effect (meaning this test can be used) for the duration of the  Covid-19 declaration under Section 564(b)(1) of the Act, 21  U.S.C. section 360bbb-3(b)(1), unless the authorization is  terminated or revoked. Performed at [facility], [place] [address] [place], [place] [address]   Culture, blood (Routine X 2) w Reflex to ID Panel     Status: None (Preliminary result)   Collection Time: 05/31/20  5:06 AM   Specimen: BLOOD LEFT ARM  Result Value Ref Range Status   Specimen Description BLOOD LEFT ARM  Final   Special Requests   Final    BOTTLES DRAWN AEROBIC AND ANAEROBIC Blood Culture adequate volume   Culture   Final    NO GROWTH 4 DAYS Performed at [facility], [place] [address] [place], [place] [address]    Report Status PENDING  Incomplete  Culture, blood (Routine X 2) w Reflex to ID Panel     Status: None (Preliminary result)   Collection Time: 05/31/20  5:06 AM   Specimen: BLOOD RIGHT HAND  Result Value Ref Range Status   Specimen Description BLOOD RIGHT HAND  Final   Special Requests   Final    BOTTLES DRAWN AEROBIC ONLY Blood Culture adequate volume   Culture   Final    NO GROWTH 4 DAYS Performed at [facility], [place] [address], [place], [place] [address]    Report Status PENDING  Incomplete   Aerobic/Anaerobic Culture (surgical/deep wound)     Status: None (Preliminary result)   Collection Time: 05/31/20  5:09 PM  Specimen: Abscess  Result Value Ref Range Status   Specimen Description ABSCESS  Final   Special Requests DRAIN  Final   Gram Stain   Final    RARE WBC PRESENT, PREDOMINANTLY PMN NO ORGANISMS SEEN    Culture   Final    NO GROWTH 4 DAYS NO ANAEROBES ISOLATED; CULTURE IN PROGRESS FOR 5 DAYS Performed at [facility], [place] [address], [place], [place] [address]    Report Status PENDING  Incomplete   Time coordinating discharge: 35 minutes  SIGNED:  Kerney Elbe, DO [facility] 06/04/2020, 6:30 PM Pager is on AMION  If 7PM-7AM, please contact night-coverage [email or web address]

## 2020-06-04 NOTE — Progress Notes (Signed)
SATURATION QUALIFICATIONS:   Patient Saturations on Room Air at Rest = 93%  Patient Saturations on Room Air while Ambulating = 88%  Patient Saturations on 2 Liters of oxygen while Ambulating = 97%

## 2020-06-05 LAB — CULTURE, BLOOD (ROUTINE X 2)
Culture: NO GROWTH
Culture: NO GROWTH
Special Requests: ADEQUATE
Special Requests: ADEQUATE

## 2020-06-05 LAB — AEROBIC/ANAEROBIC CULTURE W GRAM STAIN (SURGICAL/DEEP WOUND): Culture: NO GROWTH

## 2020-06-09 DIAGNOSIS — J479 Bronchiectasis, uncomplicated: Secondary | ICD-10-CM | POA: Diagnosis not present

## 2020-06-09 DIAGNOSIS — I130 Hypertensive heart and chronic kidney disease with heart failure and stage 1 through stage 4 chronic kidney disease, or unspecified chronic kidney disease: Secondary | ICD-10-CM | POA: Diagnosis not present

## 2020-06-09 DIAGNOSIS — J8410 Pulmonary fibrosis, unspecified: Secondary | ICD-10-CM | POA: Diagnosis not present

## 2020-06-09 DIAGNOSIS — N182 Chronic kidney disease, stage 2 (mild): Secondary | ICD-10-CM | POA: Diagnosis not present

## 2020-06-09 DIAGNOSIS — E1140 Type 2 diabetes mellitus with diabetic neuropathy, unspecified: Secondary | ICD-10-CM | POA: Diagnosis not present

## 2020-06-09 DIAGNOSIS — I318 Other specified diseases of pericardium: Secondary | ICD-10-CM | POA: Diagnosis not present

## 2020-06-09 DIAGNOSIS — I5032 Chronic diastolic (congestive) heart failure: Secondary | ICD-10-CM | POA: Diagnosis not present

## 2020-06-09 DIAGNOSIS — D631 Anemia in chronic kidney disease: Secondary | ICD-10-CM | POA: Diagnosis not present

## 2020-06-09 DIAGNOSIS — E1122 Type 2 diabetes mellitus with diabetic chronic kidney disease: Secondary | ICD-10-CM | POA: Diagnosis not present

## 2020-06-14 DIAGNOSIS — N182 Chronic kidney disease, stage 2 (mild): Secondary | ICD-10-CM | POA: Diagnosis not present

## 2020-06-14 DIAGNOSIS — I5032 Chronic diastolic (congestive) heart failure: Secondary | ICD-10-CM | POA: Diagnosis not present

## 2020-06-14 DIAGNOSIS — E1122 Type 2 diabetes mellitus with diabetic chronic kidney disease: Secondary | ICD-10-CM | POA: Diagnosis not present

## 2020-06-14 DIAGNOSIS — I318 Other specified diseases of pericardium: Secondary | ICD-10-CM | POA: Diagnosis not present

## 2020-06-14 DIAGNOSIS — I130 Hypertensive heart and chronic kidney disease with heart failure and stage 1 through stage 4 chronic kidney disease, or unspecified chronic kidney disease: Secondary | ICD-10-CM | POA: Diagnosis not present

## 2020-06-14 DIAGNOSIS — E1140 Type 2 diabetes mellitus with diabetic neuropathy, unspecified: Secondary | ICD-10-CM | POA: Diagnosis not present

## 2020-06-14 DIAGNOSIS — J479 Bronchiectasis, uncomplicated: Secondary | ICD-10-CM | POA: Diagnosis not present

## 2020-06-14 DIAGNOSIS — D631 Anemia in chronic kidney disease: Secondary | ICD-10-CM | POA: Diagnosis not present

## 2020-06-14 DIAGNOSIS — J8410 Pulmonary fibrosis, unspecified: Secondary | ICD-10-CM | POA: Diagnosis not present

## 2020-06-16 DIAGNOSIS — E1122 Type 2 diabetes mellitus with diabetic chronic kidney disease: Secondary | ICD-10-CM | POA: Diagnosis not present

## 2020-06-16 DIAGNOSIS — I130 Hypertensive heart and chronic kidney disease with heart failure and stage 1 through stage 4 chronic kidney disease, or unspecified chronic kidney disease: Secondary | ICD-10-CM | POA: Diagnosis not present

## 2020-06-16 DIAGNOSIS — I5032 Chronic diastolic (congestive) heart failure: Secondary | ICD-10-CM | POA: Diagnosis not present

## 2020-06-17 ENCOUNTER — Encounter: Payer: Self-pay | Admitting: Internal Medicine

## 2020-06-17 ENCOUNTER — Other Ambulatory Visit: Payer: Self-pay

## 2020-06-17 ENCOUNTER — Ambulatory Visit: Payer: Medicare HMO | Admitting: Internal Medicine

## 2020-06-17 VITALS — BP 120/60 | HR 104 | Ht 69.0 in | Wt 183.0 lb

## 2020-06-17 DIAGNOSIS — E119 Type 2 diabetes mellitus without complications: Secondary | ICD-10-CM | POA: Diagnosis not present

## 2020-06-17 DIAGNOSIS — J90 Pleural effusion, not elsewhere classified: Secondary | ICD-10-CM | POA: Diagnosis not present

## 2020-06-17 DIAGNOSIS — I10 Essential (primary) hypertension: Secondary | ICD-10-CM

## 2020-06-17 DIAGNOSIS — Q248 Other specified congenital malformations of heart: Secondary | ICD-10-CM | POA: Diagnosis not present

## 2020-06-17 MED ORDER — FUROSEMIDE 20 MG PO TABS: 20.0000 mg | ORAL_TABLET | Freq: Every day | ORAL | 3 refills | Status: DC

## 2020-06-17 NOTE — Progress Notes (Signed)
Cardiology Office Note:    Date:  06/17/2020   ID:  Eduardo, Murray [birth year], MRN [id number]  PCP:  Deland Pretty, MD   CC: Called to make appointment Consulted for the evaluation of pleural effusion at the behest of Deland Pretty, MD  History of Present Illness:    Eduardo Murray is a 79 y.o. male with a hx of Diabetes with HTN and pericardial cyst here for hospital follow up.  Seen 06/04/20 with shorntess of breath and found to have bilateral pleural effusions.    Patient notes that he was diagnosed with PNA and had terrible breathing issues in late September  Never had shortness of breath or DOE prior to this diagnosis of PNA and hospitalization .  No chest pain, chest pressure, no syncope.  No palpitations.  Occasional has sternal pain with coughing.  No fevers chills. Had abscess drained and thoracentesis with draining of the fluid.  No growth from the fluid.  Past Medical History:  Diagnosis Date  . Arthritis   . Diabetes mellitus without complication ([place])    type 2  . Hypertension   . Pneumonia   . Primary localized osteoarthritis of left hip 03/24/2019  . TMJ (temporomandibular joint disorder)     Past Surgical History:  Procedure Laterality Date  . right elbow surgery    . right hand surgery    . ROTATOR CUFF REPAIR Left   . 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 acetaminophen (TYLENOL) 500 MG tablet Take 500 mg by mouth every 6 (six) hours as needed for fever.  Marland Kitchen aspirin EC 81 MG tablet Take 81 mg by mouth daily. Swallow whole.  . baclofen (LIORESAL) 10 MG tablet Take 1 tablet (10 mg total) by mouth 3 (three) times daily. As needed for muscle spasm  . Dulaglutide (TRULICITY) 1.5 DG/3.8VF SOPN Inject 1.5 mg into the skin every Sunday.  . Ensure Max Protein (ENSURE MAX PROTEIN)  LIQD Take 330 mLs (11 oz total) by mouth 2 (two) times daily.  Marland Kitchen gabapentin (NEURONTIN) 300 MG capsule Take 600 mg by mouth at bedtime.  Marland Kitchen HYDROcodone-acetaminophen (NORCO) 10-325 MG tablet Take 1 tablet by mouth every 6 (six) hours as needed.  . insulin glargine, 2 Unit Dial, (TOUJEO MAX SOLOSTAR) 300 UNIT/ML Solostar Pen Inject 24 Units into the skin daily.  Marland Kitchen lisinopril (ZESTRIL) 20 MG tablet Take 1 tablet (20 mg total) by mouth daily.  . metFORMIN (GLUCOPHAGE) 1000 MG tablet Take 1,000 mg by mouth 2 (two) times daily with a meal.  . Multiple Vitamin (MULTIVITAMIN WITH MINERALS) TABS tablet Take 1 tablet by mouth daily.  . ondansetron (ZOFRAN) 4 MG tablet Take 1 tablet (4 mg total) by mouth every 8 (eight) hours as needed for nausea or vomiting.  . pantoprazole (PROTONIX) 40 MG tablet Take 40 mg by mouth daily.   . traZODone (DESYREL) 50 MG tablet Take 100 mg by mouth at bedtime.  . triamterene-hydrochlorothiazide (MAXZIDE-25) 37.5-25 MG tablet Take 1 tablet by mouth daily.  . vitamin B-12 (CYANOCOBALAMIN) 1000 MCG tablet Take 1,000 mcg by mouth daily.     Allergies:   Patient has no known allergies.   Social History   Socioeconomic History  . Marital status: Married    Spouse name: Not on file  . Number of children: Not on  file  . Years of education: Not on file  . Highest education level: Not on file  Occupational History  . Not on file  Tobacco Use  . Smoking status: Former Smoker    Packs/day: 1.00    Years: 30.00    Pack years: 30.00    Types: Cigarettes    Quit date: 12/02/1992    Years since quitting: 27.5  . Smokeless tobacco: Former Systems developer    Types: Snuff    Quit date: 12/03/2007  Vaping Use  . Vaping Use: Never used  Substance and Sexual Activity  . Alcohol use: No  . Drug use: No  . Sexual activity: Not Currently  Other Topics Concern  . Not on file  Social History Narrative  . Not on file   Social Determinants of Health   Financial Resource Strain:   .  Difficulty of Paying Living Expenses: Not on file  Food Insecurity:   . Worried About Charity fundraiser in the Last Year: Not on file  . Ran Out of Food in the Last Year: Not on file  Transportation Needs:   . Lack of Transportation (Medical): Not on file  . Lack of Transportation (Non-Medical): Not on file  Physical Activity:   . Days of Exercise per Week: Not on file  . Minutes of Exercise per Session: Not on file  Stress:   . Feeling of Stress : Not on file  Social Connections:   . Frequency of Communication with Friends and Family: Not on file  . Frequency of Social Gatherings with Friends and Family: Not on file  . Attends Religious Services: Not on file  . Active Member of Clubs or Organizations: Not on file  . Attends Archivist Meetings: Not on file  . Marital Status: Not on file     Family History: The patient's family history includes Cancer in his father and sister; Diabetes in his brother, mother, and sister.  ROS:   Please see the history of present illness.    All other systems reviewed and are negative.  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  Recent Labs: 06/04/2020: ALT 30; BUN 33; Creatinine, Ser 1.21; Hemoglobin 13.4; Magnesium 1.8; Platelets 402; Potassium 5.1; Sodium 135   Physical Exam:    VS:  BP 120/60   Pulse (!) 104   Ht 5\' 9"  (1.753 m)   Wt 183 lb (83 kg)   SpO2 94%   BMI 27.02 kg/m     Repeat HR 84  Wt Readings from Last 3 Encounters:  06/17/20 183 lb (83 kg)  06/03/20 179 lb 3.7 oz (81.3 kg)  05/26/20 192 lb 0.3 oz (87.1 kg)    GEN: Elderly  well developed in no acute distress HEENT: Bilateral Frank's Sign NECK: No JVD; No carotid bruits LYMPHATICS: No lymphadenopathy CARDIAC: RRR, no murmurs, rubs, gallops RESPIRATORY:  Clear to auscultation without rales, wheezing or rhonchi  ABDOMEN: Soft, non-tender,  non-distended MUSCULOSKELETAL:  +1 edema; No deformity  SKIN: Warm and dry NEUROLOGIC:  Alert and oriented x 3 PSYCHIATRIC:  Normal affect   ASSESSMENT:    1. Chronic bilateral pleural effusions   2. Diabetes mellitus with coincident hypertension ([place])   3. Pericardial cyst    PLAN:    In order of problems listed above:  Bilateral pleural Effusions Pericardial Cyst Lower Extremity Edema - will trial lasix 20  mg PO Daily - BMP and Mg in 7-10 days - will get echocardiogram  4 month follow up unless new symptoms or abnormal test results warranting change in plan  Would be reasonable for APP Follow up   Medication Adjustments/Labs and Tests Ordered: Current medicines are reviewed at length with the patient today.  Concerns regarding medicines are outlined above.  No orders of the defined types were placed in this encounter.  No orders of the defined types were placed in this encounter.   There are no Patient Instructions on file for this visit.   Signed, Werner Lean, MD  06/17/2020 5:20 PM    [place]

## 2020-06-17 NOTE — Patient Instructions (Signed)
Medication Instructions:  Your physician has recommended you make the following change in your medication:  1.) start furosemide (Lasix) 20 mg once a day  *If you need a refill on your cardiac medications before your next appointment, please call your pharmacy*   Lab Work: In 7 days - please return for labs (bmet, [place])  If you have labs (blood work) drawn today and your tests are completely normal, you will receive your results only by: Marland Kitchen MyChart Message (if you have MyChart) OR . A paper copy in the mail If you have any lab test that is abnormal or we need to change your treatment, we will call you to review the results.   Testing/Procedures: Your physician has requested that you have an echocardiogram. Echocardiography is a painless test that uses sound waves to create images of your heart. It provides your doctor with information about the size and shape of your heart and how well your heart's chambers and valves are working. This procedure takes approximately one hour. There are no restrictions for this procedure.   Follow-Up: At [facility], you and your health needs are our priority.  As part of our continuing mission to provide you with exceptional heart care, we have created designated Provider Care Teams.  These Care Teams include your primary Cardiologist (physician) and Advanced Practice Providers (APPs -  Physician Assistants and Nurse Practitioners) who all work together to provide you with the care you need, when you need it.  We recommend signing up for the patient portal called "MyChart".  Sign up information is provided on this After Visit Summary.  MyChart is used to connect with patients for Virtual Visits (Telemedicine).  Patients are able to view lab/test results, encounter notes, upcoming appointments, etc.  Non-urgent messages can be sent to your provider as well.   To learn more about what you can do with MyChart, go to [email or web address].    Your next  appointment:   4 month(s)  The format for your next appointment:   In Person  Provider:   Rudean Haskell, MD   Other Instructions

## 2020-06-21 DIAGNOSIS — J8410 Pulmonary fibrosis, unspecified: Secondary | ICD-10-CM | POA: Diagnosis not present

## 2020-06-21 DIAGNOSIS — D631 Anemia in chronic kidney disease: Secondary | ICD-10-CM | POA: Diagnosis not present

## 2020-06-21 DIAGNOSIS — E1122 Type 2 diabetes mellitus with diabetic chronic kidney disease: Secondary | ICD-10-CM | POA: Diagnosis not present

## 2020-06-21 DIAGNOSIS — J479 Bronchiectasis, uncomplicated: Secondary | ICD-10-CM | POA: Diagnosis not present

## 2020-06-21 DIAGNOSIS — I5032 Chronic diastolic (congestive) heart failure: Secondary | ICD-10-CM | POA: Diagnosis not present

## 2020-06-21 DIAGNOSIS — E1140 Type 2 diabetes mellitus with diabetic neuropathy, unspecified: Secondary | ICD-10-CM | POA: Diagnosis not present

## 2020-06-21 DIAGNOSIS — I130 Hypertensive heart and chronic kidney disease with heart failure and stage 1 through stage 4 chronic kidney disease, or unspecified chronic kidney disease: Secondary | ICD-10-CM | POA: Diagnosis not present

## 2020-06-21 DIAGNOSIS — I318 Other specified diseases of pericardium: Secondary | ICD-10-CM | POA: Diagnosis not present

## 2020-06-21 DIAGNOSIS — N182 Chronic kidney disease, stage 2 (mild): Secondary | ICD-10-CM | POA: Diagnosis not present

## 2020-06-23 DIAGNOSIS — J8410 Pulmonary fibrosis, unspecified: Secondary | ICD-10-CM | POA: Diagnosis not present

## 2020-06-23 DIAGNOSIS — D631 Anemia in chronic kidney disease: Secondary | ICD-10-CM | POA: Diagnosis not present

## 2020-06-23 DIAGNOSIS — I5032 Chronic diastolic (congestive) heart failure: Secondary | ICD-10-CM | POA: Diagnosis not present

## 2020-06-23 DIAGNOSIS — J479 Bronchiectasis, uncomplicated: Secondary | ICD-10-CM | POA: Diagnosis not present

## 2020-06-23 DIAGNOSIS — I318 Other specified diseases of pericardium: Secondary | ICD-10-CM | POA: Diagnosis not present

## 2020-06-23 DIAGNOSIS — E1140 Type 2 diabetes mellitus with diabetic neuropathy, unspecified: Secondary | ICD-10-CM | POA: Diagnosis not present

## 2020-06-23 DIAGNOSIS — I130 Hypertensive heart and chronic kidney disease with heart failure and stage 1 through stage 4 chronic kidney disease, or unspecified chronic kidney disease: Secondary | ICD-10-CM | POA: Diagnosis not present

## 2020-06-23 DIAGNOSIS — E1122 Type 2 diabetes mellitus with diabetic chronic kidney disease: Secondary | ICD-10-CM | POA: Diagnosis not present

## 2020-06-23 DIAGNOSIS — N182 Chronic kidney disease, stage 2 (mild): Secondary | ICD-10-CM | POA: Diagnosis not present

## 2020-06-24 ENCOUNTER — Other Ambulatory Visit: Payer: Self-pay

## 2020-06-24 ENCOUNTER — Other Ambulatory Visit: Payer: Medicare HMO | Admitting: *Deleted

## 2020-06-24 DIAGNOSIS — E119 Type 2 diabetes mellitus without complications: Secondary | ICD-10-CM | POA: Diagnosis not present

## 2020-06-24 DIAGNOSIS — Q248 Other specified congenital malformations of heart: Secondary | ICD-10-CM

## 2020-06-24 DIAGNOSIS — J90 Pleural effusion, not elsewhere classified: Secondary | ICD-10-CM

## 2020-06-24 DIAGNOSIS — I10 Essential (primary) hypertension: Secondary | ICD-10-CM | POA: Diagnosis not present

## 2020-06-24 LAB — BASIC METABOLIC PANEL
BUN/Creatinine Ratio: 19 (ref 10–24)
BUN: 20 mg/dL (ref 8–27)
CO2: 28 mmol/L (ref 20–29)
Calcium: 8.6 mg/dL (ref 8.6–10.2)
Chloride: 96 mmol/L (ref 96–106)
Creatinine, Ser: 1.07 mg/dL (ref 0.76–1.27)
GFR calc Af Amer: 76 mL/min/{1.73_m2} (ref >59)
GFR calc non Af Amer: 66 mL/min/{1.73_m2} (ref >59)
Glucose: 110 mg/dL — ABNORMAL HIGH (ref 65–99)
Potassium: 4.3 mmol/L (ref 3.5–5.2)
Sodium: 137 mmol/L (ref 134–144)

## 2020-06-24 LAB — MAGNESIUM: Magnesium: 1.2 mg/dL — ABNORMAL LOW (ref 1.6–2.3)

## 2020-06-27 ENCOUNTER — Other Ambulatory Visit: Payer: Medicare HMO

## 2020-06-28 DIAGNOSIS — I130 Hypertensive heart and chronic kidney disease with heart failure and stage 1 through stage 4 chronic kidney disease, or unspecified chronic kidney disease: Secondary | ICD-10-CM | POA: Diagnosis not present

## 2020-06-28 DIAGNOSIS — I5032 Chronic diastolic (congestive) heart failure: Secondary | ICD-10-CM | POA: Diagnosis not present

## 2020-06-28 DIAGNOSIS — N182 Chronic kidney disease, stage 2 (mild): Secondary | ICD-10-CM | POA: Diagnosis not present

## 2020-06-28 DIAGNOSIS — I318 Other specified diseases of pericardium: Secondary | ICD-10-CM | POA: Diagnosis not present

## 2020-06-28 DIAGNOSIS — J8410 Pulmonary fibrosis, unspecified: Secondary | ICD-10-CM | POA: Diagnosis not present

## 2020-06-28 DIAGNOSIS — E1140 Type 2 diabetes mellitus with diabetic neuropathy, unspecified: Secondary | ICD-10-CM | POA: Diagnosis not present

## 2020-06-28 DIAGNOSIS — J479 Bronchiectasis, uncomplicated: Secondary | ICD-10-CM | POA: Diagnosis not present

## 2020-06-28 DIAGNOSIS — D631 Anemia in chronic kidney disease: Secondary | ICD-10-CM | POA: Diagnosis not present

## 2020-06-28 DIAGNOSIS — E1122 Type 2 diabetes mellitus with diabetic chronic kidney disease: Secondary | ICD-10-CM | POA: Diagnosis not present

## 2020-06-30 ENCOUNTER — Telehealth: Payer: Self-pay | Admitting: Internal Medicine

## 2020-06-30 MED ORDER — MAGNESIUM OXIDE 400 (241.3 MG) MG PO TABS: 400.0000 mg | ORAL_TABLET | Freq: Every day | ORAL | 3 refills | Status: DC

## 2020-06-30 NOTE — Telephone Encounter (Signed)
Wife of patient returning a call about lab results. Please call back

## 2020-06-30 NOTE — Telephone Encounter (Signed)
I spoke with patient's wife and reviewed recent lab results with her.  Will send Magnesium oxide prescription to [facility] in [facility]

## 2020-07-05 DIAGNOSIS — J189 Pneumonia, unspecified organism: Secondary | ICD-10-CM | POA: Diagnosis not present

## 2020-07-05 DIAGNOSIS — A419 Sepsis, unspecified organism: Secondary | ICD-10-CM | POA: Diagnosis not present

## 2020-07-06 ENCOUNTER — Ambulatory Visit (HOSPITAL_COMMUNITY): Payer: Medicare HMO | Attending: Cardiovascular Disease

## 2020-07-06 ENCOUNTER — Other Ambulatory Visit: Payer: Self-pay | Admitting: Internal Medicine

## 2020-07-06 ENCOUNTER — Other Ambulatory Visit: Payer: Self-pay

## 2020-07-06 DIAGNOSIS — J90 Pleural effusion, not elsewhere classified: Secondary | ICD-10-CM | POA: Insufficient documentation

## 2020-07-06 DIAGNOSIS — E119 Type 2 diabetes mellitus without complications: Secondary | ICD-10-CM | POA: Insufficient documentation

## 2020-07-06 DIAGNOSIS — Q248 Other specified congenital malformations of heart: Secondary | ICD-10-CM

## 2020-07-06 DIAGNOSIS — I10 Essential (primary) hypertension: Secondary | ICD-10-CM | POA: Insufficient documentation

## 2020-07-06 LAB — ECHOCARDIOGRAM LIMITED
Area-P 1/2: 4.83 cm2
S' Lateral: 2.4 cm

## 2020-07-07 DIAGNOSIS — J8410 Pulmonary fibrosis, unspecified: Secondary | ICD-10-CM | POA: Diagnosis not present

## 2020-07-07 DIAGNOSIS — N182 Chronic kidney disease, stage 2 (mild): Secondary | ICD-10-CM | POA: Diagnosis not present

## 2020-07-07 DIAGNOSIS — D631 Anemia in chronic kidney disease: Secondary | ICD-10-CM | POA: Diagnosis not present

## 2020-07-07 DIAGNOSIS — E1140 Type 2 diabetes mellitus with diabetic neuropathy, unspecified: Secondary | ICD-10-CM | POA: Diagnosis not present

## 2020-07-07 DIAGNOSIS — I318 Other specified diseases of pericardium: Secondary | ICD-10-CM | POA: Diagnosis not present

## 2020-07-07 DIAGNOSIS — I130 Hypertensive heart and chronic kidney disease with heart failure and stage 1 through stage 4 chronic kidney disease, or unspecified chronic kidney disease: Secondary | ICD-10-CM | POA: Diagnosis not present

## 2020-07-07 DIAGNOSIS — E1122 Type 2 diabetes mellitus with diabetic chronic kidney disease: Secondary | ICD-10-CM | POA: Diagnosis not present

## 2020-07-07 DIAGNOSIS — J479 Bronchiectasis, uncomplicated: Secondary | ICD-10-CM | POA: Diagnosis not present

## 2020-07-07 DIAGNOSIS — I5032 Chronic diastolic (congestive) heart failure: Secondary | ICD-10-CM | POA: Diagnosis not present

## 2020-07-08 ENCOUNTER — Telehealth: Payer: Self-pay

## 2020-07-08 NOTE — Telephone Encounter (Signed)
-----   Message from Werner Lean, MD sent at 07/08/2020 11:43 AM EST ----- Results: No evidence of heart problems from incident pericardial cyst on CT Normal LV function and IVC 3 No significant TDI dysfunction with presence of pericardial cyst Plan: Continue current therapeutic plan  Werner Lean, MD

## 2020-07-08 NOTE — Telephone Encounter (Signed)
Informed patient of results and verbal understanding expressed.  

## 2020-07-08 NOTE — Telephone Encounter (Signed)
Patient's wife calling in. She stated that someone left a message for a 'Mr. [place]' and it came from a [place]. I advised that we were indeed reaching out to Mr. Bart and it was Noberto Retort who called - patient's wife says she will have the patient call us back later.

## 2020-07-08 NOTE — Telephone Encounter (Signed)
Follow Up:      Pt is returning Sheleigh's call.

## 2020-07-08 NOTE — Telephone Encounter (Signed)
Left a message for the patient to call the office back regarding recent test results.

## 2020-07-12 DIAGNOSIS — I5032 Chronic diastolic (congestive) heart failure: Secondary | ICD-10-CM | POA: Diagnosis not present

## 2020-07-12 DIAGNOSIS — N182 Chronic kidney disease, stage 2 (mild): Secondary | ICD-10-CM | POA: Diagnosis not present

## 2020-07-12 DIAGNOSIS — J479 Bronchiectasis, uncomplicated: Secondary | ICD-10-CM | POA: Diagnosis not present

## 2020-07-12 DIAGNOSIS — I318 Other specified diseases of pericardium: Secondary | ICD-10-CM | POA: Diagnosis not present

## 2020-07-12 DIAGNOSIS — E1122 Type 2 diabetes mellitus with diabetic chronic kidney disease: Secondary | ICD-10-CM | POA: Diagnosis not present

## 2020-07-12 DIAGNOSIS — J8410 Pulmonary fibrosis, unspecified: Secondary | ICD-10-CM | POA: Diagnosis not present

## 2020-07-12 DIAGNOSIS — E1140 Type 2 diabetes mellitus with diabetic neuropathy, unspecified: Secondary | ICD-10-CM | POA: Diagnosis not present

## 2020-07-12 DIAGNOSIS — I130 Hypertensive heart and chronic kidney disease with heart failure and stage 1 through stage 4 chronic kidney disease, or unspecified chronic kidney disease: Secondary | ICD-10-CM | POA: Diagnosis not present

## 2020-07-12 DIAGNOSIS — D631 Anemia in chronic kidney disease: Secondary | ICD-10-CM | POA: Diagnosis not present

## 2020-07-18 DIAGNOSIS — E1140 Type 2 diabetes mellitus with diabetic neuropathy, unspecified: Secondary | ICD-10-CM | POA: Diagnosis not present

## 2020-07-18 DIAGNOSIS — J479 Bronchiectasis, uncomplicated: Secondary | ICD-10-CM | POA: Diagnosis not present

## 2020-07-18 DIAGNOSIS — D631 Anemia in chronic kidney disease: Secondary | ICD-10-CM | POA: Diagnosis not present

## 2020-07-18 DIAGNOSIS — N182 Chronic kidney disease, stage 2 (mild): Secondary | ICD-10-CM | POA: Diagnosis not present

## 2020-07-18 DIAGNOSIS — I130 Hypertensive heart and chronic kidney disease with heart failure and stage 1 through stage 4 chronic kidney disease, or unspecified chronic kidney disease: Secondary | ICD-10-CM | POA: Diagnosis not present

## 2020-07-18 DIAGNOSIS — I318 Other specified diseases of pericardium: Secondary | ICD-10-CM | POA: Diagnosis not present

## 2020-07-18 DIAGNOSIS — I5032 Chronic diastolic (congestive) heart failure: Secondary | ICD-10-CM | POA: Diagnosis not present

## 2020-07-18 DIAGNOSIS — E1122 Type 2 diabetes mellitus with diabetic chronic kidney disease: Secondary | ICD-10-CM | POA: Diagnosis not present

## 2020-07-18 DIAGNOSIS — J8410 Pulmonary fibrosis, unspecified: Secondary | ICD-10-CM | POA: Diagnosis not present

## 2020-08-04 DIAGNOSIS — I5032 Chronic diastolic (congestive) heart failure: Secondary | ICD-10-CM | POA: Diagnosis not present

## 2020-08-04 DIAGNOSIS — J479 Bronchiectasis, uncomplicated: Secondary | ICD-10-CM | POA: Diagnosis not present

## 2020-08-04 DIAGNOSIS — D631 Anemia in chronic kidney disease: Secondary | ICD-10-CM | POA: Diagnosis not present

## 2020-08-04 DIAGNOSIS — I130 Hypertensive heart and chronic kidney disease with heart failure and stage 1 through stage 4 chronic kidney disease, or unspecified chronic kidney disease: Secondary | ICD-10-CM | POA: Diagnosis not present

## 2020-08-04 DIAGNOSIS — J8410 Pulmonary fibrosis, unspecified: Secondary | ICD-10-CM | POA: Diagnosis not present

## 2020-08-04 DIAGNOSIS — E1140 Type 2 diabetes mellitus with diabetic neuropathy, unspecified: Secondary | ICD-10-CM | POA: Diagnosis not present

## 2020-08-04 DIAGNOSIS — E1122 Type 2 diabetes mellitus with diabetic chronic kidney disease: Secondary | ICD-10-CM | POA: Diagnosis not present

## 2020-08-04 DIAGNOSIS — I318 Other specified diseases of pericardium: Secondary | ICD-10-CM | POA: Diagnosis not present

## 2020-08-04 DIAGNOSIS — N182 Chronic kidney disease, stage 2 (mild): Secondary | ICD-10-CM | POA: Diagnosis not present

## 2020-10-06 ENCOUNTER — Ambulatory Visit: Payer: Medicare HMO | Admitting: Internal Medicine

## 2020-10-06 NOTE — Progress Notes (Deleted)
Cardiology Office Note:    Date:  10/06/2020   ID:  Eduardo Murray, Eduardo Murray 09-02-1940, MRN [id number]  PCP:  Deland Pretty, MD   CC: Follow up pericardial cyst Consulted for the evaluation of pleural effusion at the behest of Deland Pretty, MD  History of Present Illness:    Eduardo Murray is a 80 y.o. male with a hx of Diabetes with HTN and pericardial cyst here for hospital follow up.  Seen 06/04/20 with shorntess of breath and found to have bilateral pleural effusions and presented for evaluation 06/17/20.  In interim had echocardiogram that did not demonstrated the pericardial cyst (location on CT was PA bifurcation).    Patient notes that he is doing ***.  Since day prior/last visit notes *** changes.  Relevant interval testing or therapy include ***.  There are no*** interval hospital/ED visit.    No chest pain or pressure ***.  No SOB/DOE*** and no PND/Orthopnea***.  No weight gain or leg swelling***.  No palpitations or syncope ***.  Ambulatory blood pressure ***.   Past Medical History:  Diagnosis Date  . Arthritis   . Diabetes mellitus without complication ([place])    type 2  . Hypertension   . Pneumonia   . Primary localized osteoarthritis of left hip 03/24/2019  . TMJ (temporomandibular joint disorder)     Past Surgical History:  Procedure Laterality Date  . right elbow surgery    . right hand surgery    . ROTATOR CUFF REPAIR Left   . 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   Procedure: TOTAL HIP ARTHROPLASTY;  Surgeon: Marchia Bond, MD;  Location: [facility];  Service: Orthopedics;  Laterality: Left;   Current Medications: No outpatient medications have been marked as taking for the 10/06/20 encounter (Appointment) with Werner Lean, MD.     Allergies:   Patient has no known allergies.   Social History   Socioeconomic History  . Marital status: Married    Spouse name: Not on file  . Number of children: Not on file  . Years of education: Not on file   . Highest education level: Not on file  Occupational History  . Not on file  Tobacco Use  . Smoking status: Former Smoker    Packs/day: 1.00    Years: 30.00    Pack years: 30.00    Types: Cigarettes    Quit date: 12/02/1992    Years since quitting: 27.8  . Smokeless tobacco: Former Systems developer    Types: Snuff    Quit date: 12/03/2007  Vaping Use  . Vaping Use: Never used  Substance and Sexual Activity  . Alcohol use: No  . Drug use: No  . Sexual activity: Not Currently  Other Topics Concern  . Not on file  Social History Narrative  . Not on file   Social Determinants of Health   Financial Resource Strain: Not on file  Food Insecurity: Not on file  Transportation Needs: Not on file  Physical Activity: Not on file  Stress: Not on file  Social Connections: Not on file     Family History: The patient's family history includes Cancer in his father and sister; Diabetes in his brother, mother, and sister.  ROS:   Please see the history of present illness.    All other systems reviewed and are negative.  EKGs/Labs/Other Studies Reviewed:    The following studies were reviewed today:  EKG:   10/06/20: *** 05/30/20 SR vs EAT rate 105 with RBBB and  LAFB.  Recent Labs: 06/04/2020: ALT 30; Hemoglobin 13.4; Platelets 402 06/24/2020: BUN 20; Creatinine, Ser 1.07; Magnesium 1.2; Potassium 4.3; Sodium 137   Physical Exam:    VS:  There were no vitals taken for this visit.    Wt Readings from Last 3 Encounters:  06/17/20 183 lb (83 kg)  06/03/20 179 lb 3.7 oz (81.3 kg)  05/26/20 192 lb 0.3 oz (87.1 kg)    GEN: Elderly  well developed in no acute distress HEENT: Bilateral Frank's Sign NECK: No JVD; No carotid bruits LYMPHATICS: No lymphadenopathy CARDIAC: RRR, no murmurs, rubs, gallops RESPIRATORY:  Clear to auscultation without rales, wheezing or rhonchi  ABDOMEN: Soft, non-tender, non-distended MUSCULOSKELETAL:  +1 edema; No deformity  SKIN: Warm and dry NEUROLOGIC:  Alert  and oriented x 3 PSYCHIATRIC:  Normal affect   ASSESSMENT:    No diagnosis found. PLAN:    In order of problems listed above:  Pericardial Cyst  Lower extremity Edema (bilateral) with concern for venous insufficiency - without without leg claudication or ulcer *** - will get echocardiogram, BNP/NTproBNP - will get Arterial duplex bilateral and ABI - no evidence of VTE; will order Bilateral Venous Lower Extremity Reflux study - will increase utilization of compression stockings - CMP, UA, and thyroid studies for rule out hypoproteinemia, nephrotic syndrome, thyroid disease based on prior testing - on no iatrogenic medications - if no improvement by follow up visit, with no other cause, will send to vein/vascular specialist for evaluation   *** follow up unless new symptoms or abnormal test results warranting change in plan  Would be reasonable for *** Video Visit Follow up  Would be reasonable for *** APP Follow up   Medication Adjustments/Labs and Tests Ordered: Current medicines are reviewed at length with the patient today.  Concerns regarding medicines are outlined above.  No orders of the defined types were placed in this encounter.  No orders of the defined types were placed in this encounter.   There are no Patient Instructions on file for this visit.   Signed, Werner Lean, MD  10/06/2020 12:55 PM    [place]

## 2020-11-08 DIAGNOSIS — I10 Essential (primary) hypertension: Secondary | ICD-10-CM | POA: Diagnosis not present

## 2020-11-08 DIAGNOSIS — E1165 Type 2 diabetes mellitus with hyperglycemia: Secondary | ICD-10-CM | POA: Diagnosis not present

## 2020-11-08 DIAGNOSIS — Z125 Encounter for screening for malignant neoplasm of prostate: Secondary | ICD-10-CM | POA: Diagnosis not present

## 2020-11-10 DIAGNOSIS — N400 Enlarged prostate without lower urinary tract symptoms: Secondary | ICD-10-CM | POA: Diagnosis not present

## 2020-11-10 DIAGNOSIS — E113292 Type 2 diabetes mellitus with mild nonproliferative diabetic retinopathy without macular edema, left eye: Secondary | ICD-10-CM | POA: Diagnosis not present

## 2020-11-10 DIAGNOSIS — I10 Essential (primary) hypertension: Secondary | ICD-10-CM | POA: Diagnosis not present

## 2020-11-10 DIAGNOSIS — Z0000 Encounter for general adult medical examination without abnormal findings: Secondary | ICD-10-CM | POA: Diagnosis not present

## 2020-11-10 DIAGNOSIS — R0989 Other specified symptoms and signs involving the circulatory and respiratory systems: Secondary | ICD-10-CM | POA: Diagnosis not present

## 2020-11-10 DIAGNOSIS — E1121 Type 2 diabetes mellitus with diabetic nephropathy: Secondary | ICD-10-CM | POA: Diagnosis not present

## 2020-11-10 DIAGNOSIS — F5104 Psychophysiologic insomnia: Secondary | ICD-10-CM | POA: Diagnosis not present

## 2020-11-10 DIAGNOSIS — E7800 Pure hypercholesterolemia, unspecified: Secondary | ICD-10-CM | POA: Diagnosis not present

## 2020-11-16 DIAGNOSIS — H2513 Age-related nuclear cataract, bilateral: Secondary | ICD-10-CM | POA: Diagnosis not present

## 2020-11-16 DIAGNOSIS — E113293 Type 2 diabetes mellitus with mild nonproliferative diabetic retinopathy without macular edema, bilateral: Secondary | ICD-10-CM | POA: Diagnosis not present

## 2020-11-21 DIAGNOSIS — E119 Type 2 diabetes mellitus without complications: Secondary | ICD-10-CM | POA: Diagnosis not present

## 2020-11-21 DIAGNOSIS — H2513 Age-related nuclear cataract, bilateral: Secondary | ICD-10-CM | POA: Diagnosis not present

## 2020-11-21 DIAGNOSIS — I10 Essential (primary) hypertension: Secondary | ICD-10-CM | POA: Diagnosis not present

## 2020-11-21 DIAGNOSIS — H2511 Age-related nuclear cataract, right eye: Secondary | ICD-10-CM | POA: Diagnosis not present

## 2020-12-06 DIAGNOSIS — E1165 Type 2 diabetes mellitus with hyperglycemia: Secondary | ICD-10-CM | POA: Diagnosis not present

## 2020-12-06 DIAGNOSIS — I10 Essential (primary) hypertension: Secondary | ICD-10-CM | POA: Diagnosis not present

## 2020-12-08 DIAGNOSIS — E1165 Type 2 diabetes mellitus with hyperglycemia: Secondary | ICD-10-CM | POA: Diagnosis not present

## 2020-12-08 DIAGNOSIS — I10 Essential (primary) hypertension: Secondary | ICD-10-CM | POA: Diagnosis not present

## 2020-12-15 DIAGNOSIS — H2511 Age-related nuclear cataract, right eye: Secondary | ICD-10-CM | POA: Diagnosis not present

## 2020-12-16 DIAGNOSIS — H2512 Age-related nuclear cataract, left eye: Secondary | ICD-10-CM | POA: Diagnosis not present

## 2020-12-16 DIAGNOSIS — H25012 Cortical age-related cataract, left eye: Secondary | ICD-10-CM | POA: Diagnosis not present

## 2020-12-16 DIAGNOSIS — H25042 Posterior subcapsular polar age-related cataract, left eye: Secondary | ICD-10-CM | POA: Diagnosis not present

## 2020-12-23 ENCOUNTER — Institutional Professional Consult (permissible substitution): Payer: Medicare HMO | Admitting: Pulmonary Disease

## 2020-12-29 DIAGNOSIS — H2512 Age-related nuclear cataract, left eye: Secondary | ICD-10-CM | POA: Diagnosis not present

## 2021-01-17 ENCOUNTER — Other Ambulatory Visit: Payer: Self-pay

## 2021-01-17 ENCOUNTER — Ambulatory Visit: Payer: Medicare HMO | Admitting: Pulmonary Disease

## 2021-01-17 ENCOUNTER — Encounter: Payer: Self-pay | Admitting: Pulmonary Disease

## 2021-01-17 VITALS — BP 122/74 | HR 76 | Temp 98.0°F | Ht 69.0 in | Wt 190.6 lb

## 2021-01-17 DIAGNOSIS — J90 Pleural effusion, not elsewhere classified: Secondary | ICD-10-CM | POA: Diagnosis not present

## 2021-01-17 NOTE — Progress Notes (Signed)
Synopsis: Referred in May 2022 for right pleural effusion  Subjective:   PATIENT ID: Eduardo Murray GENDER: male DOB: June 17, 1941, MRN: [id number]   HPI  Chief Complaint  Patient presents with  . Consult    Referred by Dr. Shelia Media for fluid around right lung. States he had PNA last year. Denies any current symptoms.    Eduardo Murray is a 80 year old male, former smoker with DMII and hypertension who is referred to pulmonary clinic for right pleural effusion.   Patient was admitted October 2021 for bilateral pleural effusions in the setting of acute pneumonia.  He had a left-sided pigtail chest tube placed by CT surgery with resolution of the left pleural effusion.  Pleural fluid culture from 05/31/2020 is negative.  On subsequent chest radiograph on November 10, 2020 which shows persistent small right pleural effusion and slight left effusion.  Patient reports his breathing has been stable since hospitalization.  He denies any issues or limitations to his daily activities based on his breathing.  He is currently working on [employer] a garden.  He denies any cough or wheezing.  He denies any coughing or choking with eating solids or liquids.  He has reflux that is well controlled on pantoprazole.  He is a former smoker with a 30-pack-year smoking history.  Past Medical History:  Diagnosis Date  . Arthritis   . Diabetes mellitus without complication ([place])    type 2  . Hypertension   . Pneumonia   . Primary localized osteoarthritis of left hip 03/24/2019  . TMJ (temporomandibular joint disorder)      Family History  Problem Relation Age of Onset  . Cancer Father   . Diabetes Mother   . Cancer Sister   . Diabetes Sister   . Diabetes Brother      Social History   Socioeconomic History  . Marital status: Married    Spouse name: Not on file  . Number of children: Not on file  . Years of education: Not on file  . Highest education level: Not on file  Occupational History  . Not  on file  Tobacco Use  . Smoking status: Former Smoker    Packs/day: 1.00    Years: 30.00    Pack years: 30.00    Types: Cigarettes    Quit date: 12/02/1992    Years since quitting: 28.1  . Smokeless tobacco: Former Systems developer    Types: Snuff    Quit date: 12/03/2007  Vaping Use  . Vaping Use: Never used  Substance and Sexual Activity  . Alcohol use: No  . Drug use: No  . Sexual activity: Not Currently  Other Topics Concern  . Not on file  Social History Narrative  . Not on file   Social Determinants of Health   Financial Resource Strain: Not on file  Food Insecurity: Not on file  Transportation Needs: Not on file  Physical Activity: Not on file  Stress: Not on file  Social Connections: Not on file  Intimate Partner Violence: Not on file     No Known Allergies   Outpatient Medications Prior to Visit  Medication Sig Dispense Refill  . aspirin EC 81 MG tablet Take 81 mg by mouth daily. Swallow whole.    . baclofen (LIORESAL) 10 MG tablet Take 1 tablet (10 mg total) by mouth 3 (three) times daily. As needed for muscle spasm 50 tablet 0  . Dulaglutide (TRULICITY) 1.5 JJ/8.8CZ SOPN Inject 1.5 mg into the skin every  Sunday.    . furosemide (LASIX) 20 MG tablet Take 1 tablet (20 mg total) by mouth daily. 90 tablet 3  . gabapentin (NEURONTIN) 300 MG capsule Take 600 mg by mouth at bedtime.    . insulin glargine, 2 Unit Dial, (TOUJEO MAX SOLOSTAR) 300 UNIT/ML Solostar Pen Inject 24 Units into the skin daily.    Marland Kitchen lisinopril (ZESTRIL) 40 MG tablet Take 40 mg by mouth daily.    . magnesium oxide (MAG-OX) 400 (241.3 Mg) MG tablet Take 1 tablet (400 mg total) by mouth daily. 90 tablet 3  . metFORMIN (GLUCOPHAGE) 1000 MG tablet Take 1,000 mg by mouth 2 (two) times daily with a meal.    . Multiple Vitamin (MULTIVITAMIN WITH MINERALS) TABS tablet Take 1 tablet by mouth daily. 30 tablet 0  . pantoprazole (PROTONIX) 40 MG tablet Take 40 mg by mouth daily.     . traZODone (DESYREL) 50 MG tablet  Take 100 mg by mouth at bedtime.    . vitamin B-12 (CYANOCOBALAMIN) 1000 MCG tablet Take 1,000 mcg by mouth daily.    Marland Kitchen acetaminophen (TYLENOL) 500 MG tablet Take 500 mg by mouth every 6 (six) hours as needed for fever.    . Ensure Max Protein (ENSURE MAX PROTEIN) LIQD Take 330 mLs (11 oz total) by mouth 2 (two) times daily. 3300 mL 0  . HYDROcodone-acetaminophen (NORCO) 10-325 MG tablet Take 1 tablet by mouth every 6 (six) hours as needed. 28 tablet 0  . lisinopril (ZESTRIL) 20 MG tablet Take 1 tablet (20 mg total) by mouth daily. 30 tablet 0  . ondansetron (ZOFRAN) 4 MG tablet Take 1 tablet (4 mg total) by mouth every 8 (eight) hours as needed for nausea or vomiting. 10 tablet 0  . triamterene-hydrochlorothiazide (MAXZIDE-25) 37.5-25 MG tablet Take 1 tablet by mouth daily.     No facility-administered medications prior to visit.    Review of Systems  Constitutional: Negative for chills, fever, malaise/fatigue and weight loss.  HENT: Negative for congestion, sinus pain and sore throat.   Eyes: Negative.   Respiratory: Negative for cough, hemoptysis, sputum production, shortness of breath and wheezing.   Cardiovascular: Negative for chest pain, palpitations, orthopnea, claudication and leg swelling.  Gastrointestinal: Negative for abdominal pain, heartburn, nausea and vomiting.  Genitourinary: Negative.   Musculoskeletal: Negative for joint pain and myalgias.  Skin: Negative for rash.  Neurological: Negative for weakness.  Endo/Heme/Allergies: Negative.   Psychiatric/Behavioral: Negative.       Objective:   Vitals:   01/17/21 1139  BP: 122/74  Pulse: 76  Temp: 98 F (36.7 C)  TempSrc: Temporal  SpO2: 97%  Weight: 190 lb 9.6 oz (86.5 kg)  Height: 5\' 9"  (1.753 m)     Physical Exam Constitutional:      General: He is not in acute distress. HENT:     Head: Normocephalic and atraumatic.  Eyes:     Extraocular Movements: Extraocular movements intact.      Conjunctiva/sclera: Conjunctivae normal.     Pupils: Pupils are equal, round, and reactive to light.  Cardiovascular:     Rate and Rhythm: Normal rate and regular rhythm.     Pulses: Normal pulses.     Heart sounds: Normal heart sounds. No murmur heard.   Pulmonary:     Effort: Pulmonary effort is normal.     Breath sounds: Rales (right base) present.  Abdominal:     General: Bowel sounds are normal.     Palpations: Abdomen is soft.  Musculoskeletal:     Right lower leg: No edema.     Left lower leg: No edema.  Lymphadenopathy:     Cervical: No cervical adenopathy.  Skin:    General: Skin is warm and dry.  Neurological:     General: No focal deficit present.     Mental Status: He is alert.  Psychiatric:        Mood and Affect: Mood normal.        Behavior: Behavior normal.        Thought Content: Thought content normal.        Judgment: Judgment normal.       CBC    Component Value Date/Time   WBC 9.0 06/04/2020 0203   RBC 4.67 06/04/2020 0203   HGB 13.4 06/04/2020 0203   HCT 42.2 06/04/2020 0203   PLT 402 (H) 06/04/2020 0203   MCV 90.4 06/04/2020 0203   MCH 28.7 06/04/2020 0203   MCHC 31.8 06/04/2020 0203   RDW 13.1 06/04/2020 0203   LYMPHSABS 1.8 06/04/2020 0203   MONOABS 0.6 06/04/2020 0203   EOSABS 0.2 06/04/2020 0203   BASOSABS 0.1 06/04/2020 0203   BMP Latest Ref Rng & Units 06/24/2020 06/04/2020 06/03/2020  Glucose 65 - 99 mg/dL 110(H) 203(H) 246(H)  BUN 8 - 27 mg/dL 20 33(H) 36(H)  Creatinine 0.76 - 1.27 mg/dL 1.07 1.21 1.42(H)  BUN/Creat Ratio 10 - 24 19 - -  Sodium 134 - 144 mmol/L 137 135 131(L)  Potassium 3.5 - 5.2 mmol/L 4.3 5.1 4.9  Chloride 96 - 106 mmol/L 96 100 97(L)  CO2 20 - 29 mmol/L 28 27 26   Calcium 8.6 - 10.2 mg/dL 8.6 9.1 9.0    Chest imaging: CXR 11/15/20 1. Small right pleural effusion appears similar to prior exams. 2. Decreased size of left pleural effusion with small residual.  CT Chest w contrast 05/30/20 1. Moderate  loculated left pleural effusion. 2. Trace to small volume right pleural effusion. 3. Bilateral lower lobe passive atelectasis with underlying infection/inflammation not excluded.  PFT: No flowsheet data found.   Echo 07/06/20: LVEF 60 to 65%.  LV has normal function.  RV systolic function is normal.  RV size is normal.  No significant valvular issues.  Heart Catheterization:       Assessment & Plan:   Pleural effusion on right - Plan: CT Chest Wo Contrast  Discussion: Eduardo Murray is a 80 year old male, former smoker with DMII and hypertension who is referred to pulmonary clinic for right pleural effusion.   Patient's bilateral pleural effusions are in the setting of a parapneumonic process.  The left pleural effusion is improved after left-sided pigtail chest tube was placed.  He continues to have small amount of fluid in the right pleural space.  We will obtain a follow-up CT chest to evaluate for any loculated effusions.  The patient is otherwise asymptomatic at this time and we will continue to monitor the effusions.  If the patient develops any signs of occult infection such as fatigue, lethargy, lack of appetite, fever, chills, night sweats then he is to call our office.  We will determine follow-up based on his CT scan results.  Freda Jackson, MD [facility] Office: [phone]   See Amion for personal pager PCCM on call pager [phone] until 7pm. Please call Elink 7p-7a. [phone]  Current Outpatient Medications:  .  aspirin EC 81 MG tablet, Take 81 mg by mouth daily. Swallow whole., Disp: , Rfl:  .  baclofen (LIORESAL) 10 MG tablet, Take 1 tablet (10 mg total) by mouth 3 (three) times daily. As needed for muscle spasm, Disp: 50 tablet, Rfl: 0 .  Dulaglutide (TRULICITY) 1.5 XM/5.8KI SOPN, Inject 1.5 mg into the skin every Sunday., Disp: , Rfl:  .  furosemide (LASIX) 20 MG tablet, Take 1 tablet (20 mg total) by mouth daily., Disp: 90  tablet, Rfl: 3 .  gabapentin (NEURONTIN) 300 MG capsule, Take 600 mg by mouth at bedtime., Disp: , Rfl:  .  insulin glargine, 2 Unit Dial, (TOUJEO MAX SOLOSTAR) 300 UNIT/ML Solostar Pen, Inject 24 Units into the skin daily., Disp: , Rfl:  .  lisinopril (ZESTRIL) 40 MG tablet, Take 40 mg by mouth daily., Disp: , Rfl:  .  magnesium oxide (MAG-OX) 400 (241.3 Mg) MG tablet, Take 1 tablet (400 mg total) by mouth daily., Disp: 90 tablet, Rfl: 3 .  metFORMIN (GLUCOPHAGE) 1000 MG tablet, Take 1,000 mg by mouth 2 (two) times daily with a meal., Disp: , Rfl:  .  Multiple Vitamin (MULTIVITAMIN WITH MINERALS) TABS tablet, Take 1 tablet by mouth daily., Disp: 30 tablet, Rfl: 0 .  pantoprazole (PROTONIX) 40 MG tablet, Take 40 mg by mouth daily. , Disp: , Rfl:  .  traZODone (DESYREL) 50 MG tablet, Take 100 mg by mouth at bedtime., Disp: , Rfl:  .  vitamin B-12 (CYANOCOBALAMIN) 1000 MCG tablet, Take 1,000 mcg by mouth daily., Disp: , Rfl:

## 2021-01-17 NOTE — Patient Instructions (Addendum)
We will schedule you for a CT Chest scan to evaluate and monitor the right pleural effusion.   Please call us if your breathing worsens.

## 2021-01-18 ENCOUNTER — Encounter: Payer: Self-pay | Admitting: Pulmonary Disease

## 2021-01-19 ENCOUNTER — Telehealth: Payer: Self-pay | Admitting: Pulmonary Disease

## 2021-01-20 ENCOUNTER — Other Ambulatory Visit: Payer: Self-pay

## 2021-01-20 ENCOUNTER — Ambulatory Visit (INDEPENDENT_AMBULATORY_CARE_PROVIDER_SITE_OTHER)
Admission: RE | Admit: 2021-01-20 | Discharge: 2021-01-20 | Disposition: A | Discharge status: Home / Self Care | Payer: Medicare HMO | Source: Ambulatory Visit | Attending: Pulmonary Disease | Admitting: Pulmonary Disease

## 2021-01-20 DIAGNOSIS — I2510 Atherosclerotic heart disease of native coronary artery without angina pectoris: Secondary | ICD-10-CM | POA: Diagnosis not present

## 2021-01-20 DIAGNOSIS — J90 Pleural effusion, not elsewhere classified: Secondary | ICD-10-CM

## 2021-01-20 DIAGNOSIS — Z8701 Personal history of pneumonia (recurrent): Secondary | ICD-10-CM | POA: Diagnosis not present

## 2021-01-20 DIAGNOSIS — J9811 Atelectasis: Secondary | ICD-10-CM | POA: Diagnosis not present

## 2021-02-01 NOTE — Telephone Encounter (Signed)
Nothing noted in message. Will close encounter.  

## 2021-02-13 DIAGNOSIS — I10 Essential (primary) hypertension: Secondary | ICD-10-CM | POA: Diagnosis not present

## 2021-02-13 DIAGNOSIS — E7800 Pure hypercholesterolemia, unspecified: Secondary | ICD-10-CM | POA: Diagnosis not present

## 2021-02-13 DIAGNOSIS — Z6828 Body mass index (BMI) 28.0-28.9, adult: Secondary | ICD-10-CM | POA: Diagnosis not present

## 2021-02-13 DIAGNOSIS — E113292 Type 2 diabetes mellitus with mild nonproliferative diabetic retinopathy without macular edema, left eye: Secondary | ICD-10-CM | POA: Diagnosis not present

## 2021-02-13 DIAGNOSIS — Z794 Long term (current) use of insulin: Secondary | ICD-10-CM | POA: Diagnosis not present

## 2021-02-13 DIAGNOSIS — E1165 Type 2 diabetes mellitus with hyperglycemia: Secondary | ICD-10-CM | POA: Diagnosis not present

## 2021-02-13 DIAGNOSIS — E1169 Type 2 diabetes mellitus with other specified complication: Secondary | ICD-10-CM | POA: Diagnosis not present

## 2021-02-28 DIAGNOSIS — R899 Unspecified abnormal finding in specimens from other organs, systems and tissues: Secondary | ICD-10-CM | POA: Diagnosis not present

## 2021-06-22 ENCOUNTER — Other Ambulatory Visit: Payer: Self-pay | Admitting: Internal Medicine

## 2021-08-08 DIAGNOSIS — E1165 Type 2 diabetes mellitus with hyperglycemia: Secondary | ICD-10-CM | POA: Diagnosis not present

## 2021-08-15 DIAGNOSIS — E1165 Type 2 diabetes mellitus with hyperglycemia: Secondary | ICD-10-CM | POA: Diagnosis not present

## 2021-08-15 DIAGNOSIS — I10 Essential (primary) hypertension: Secondary | ICD-10-CM | POA: Diagnosis not present

## 2021-08-15 DIAGNOSIS — Z23 Encounter for immunization: Secondary | ICD-10-CM | POA: Diagnosis not present

## 2021-08-15 DIAGNOSIS — E7800 Pure hypercholesterolemia, unspecified: Secondary | ICD-10-CM | POA: Diagnosis not present

## 2021-08-15 DIAGNOSIS — Z6828 Body mass index (BMI) 28.0-28.9, adult: Secondary | ICD-10-CM | POA: Diagnosis not present

## 2021-08-15 DIAGNOSIS — Z794 Long term (current) use of insulin: Secondary | ICD-10-CM | POA: Diagnosis not present

## 2021-08-15 DIAGNOSIS — E113292 Type 2 diabetes mellitus with mild nonproliferative diabetic retinopathy without macular edema, left eye: Secondary | ICD-10-CM | POA: Diagnosis not present

## 2021-08-15 DIAGNOSIS — E1169 Type 2 diabetes mellitus with other specified complication: Secondary | ICD-10-CM | POA: Diagnosis not present

## 2021-08-25 DIAGNOSIS — S43101A Unspecified dislocation of right acromioclavicular joint, initial encounter: Secondary | ICD-10-CM | POA: Diagnosis not present

## 2021-11-10 DIAGNOSIS — E1165 Type 2 diabetes mellitus with hyperglycemia: Secondary | ICD-10-CM | POA: Diagnosis not present

## 2021-11-10 DIAGNOSIS — I10 Essential (primary) hypertension: Secondary | ICD-10-CM | POA: Diagnosis not present

## 2021-11-10 DIAGNOSIS — E7800 Pure hypercholesterolemia, unspecified: Secondary | ICD-10-CM | POA: Diagnosis not present

## 2021-11-15 DIAGNOSIS — R0989 Other specified symptoms and signs involving the circulatory and respiratory systems: Secondary | ICD-10-CM | POA: Diagnosis not present

## 2021-11-15 DIAGNOSIS — Z0000 Encounter for general adult medical examination without abnormal findings: Secondary | ICD-10-CM | POA: Diagnosis not present

## 2021-11-15 DIAGNOSIS — I5032 Chronic diastolic (congestive) heart failure: Secondary | ICD-10-CM | POA: Diagnosis not present

## 2021-11-15 DIAGNOSIS — E113292 Type 2 diabetes mellitus with mild nonproliferative diabetic retinopathy without macular edema, left eye: Secondary | ICD-10-CM | POA: Diagnosis not present

## 2021-11-15 DIAGNOSIS — I10 Essential (primary) hypertension: Secondary | ICD-10-CM | POA: Diagnosis not present

## 2021-11-15 DIAGNOSIS — M792 Neuralgia and neuritis, unspecified: Secondary | ICD-10-CM | POA: Diagnosis not present

## 2021-11-15 DIAGNOSIS — N400 Enlarged prostate without lower urinary tract symptoms: Secondary | ICD-10-CM | POA: Diagnosis not present

## 2021-11-15 DIAGNOSIS — E1121 Type 2 diabetes mellitus with diabetic nephropathy: Secondary | ICD-10-CM | POA: Diagnosis not present

## 2022-02-13 DIAGNOSIS — Z794 Long term (current) use of insulin: Secondary | ICD-10-CM | POA: Diagnosis not present

## 2022-02-13 DIAGNOSIS — E1165 Type 2 diabetes mellitus with hyperglycemia: Secondary | ICD-10-CM | POA: Diagnosis not present

## 2022-02-13 DIAGNOSIS — E7800 Pure hypercholesterolemia, unspecified: Secondary | ICD-10-CM | POA: Diagnosis not present

## 2022-02-13 DIAGNOSIS — I10 Essential (primary) hypertension: Secondary | ICD-10-CM | POA: Diagnosis not present

## 2022-02-13 DIAGNOSIS — E1169 Type 2 diabetes mellitus with other specified complication: Secondary | ICD-10-CM | POA: Diagnosis not present

## 2022-02-13 DIAGNOSIS — E1121 Type 2 diabetes mellitus with diabetic nephropathy: Secondary | ICD-10-CM | POA: Diagnosis not present

## 2022-03-13 IMAGING — CR DG CHEST 2V
2 series · 2 of 2 positions shown · non-contrast
Comparison: November 24, 2012.

CLINICAL DATA: Shortness of breath.

EXAM:
CHEST - 2 VIEW

[chest pa]
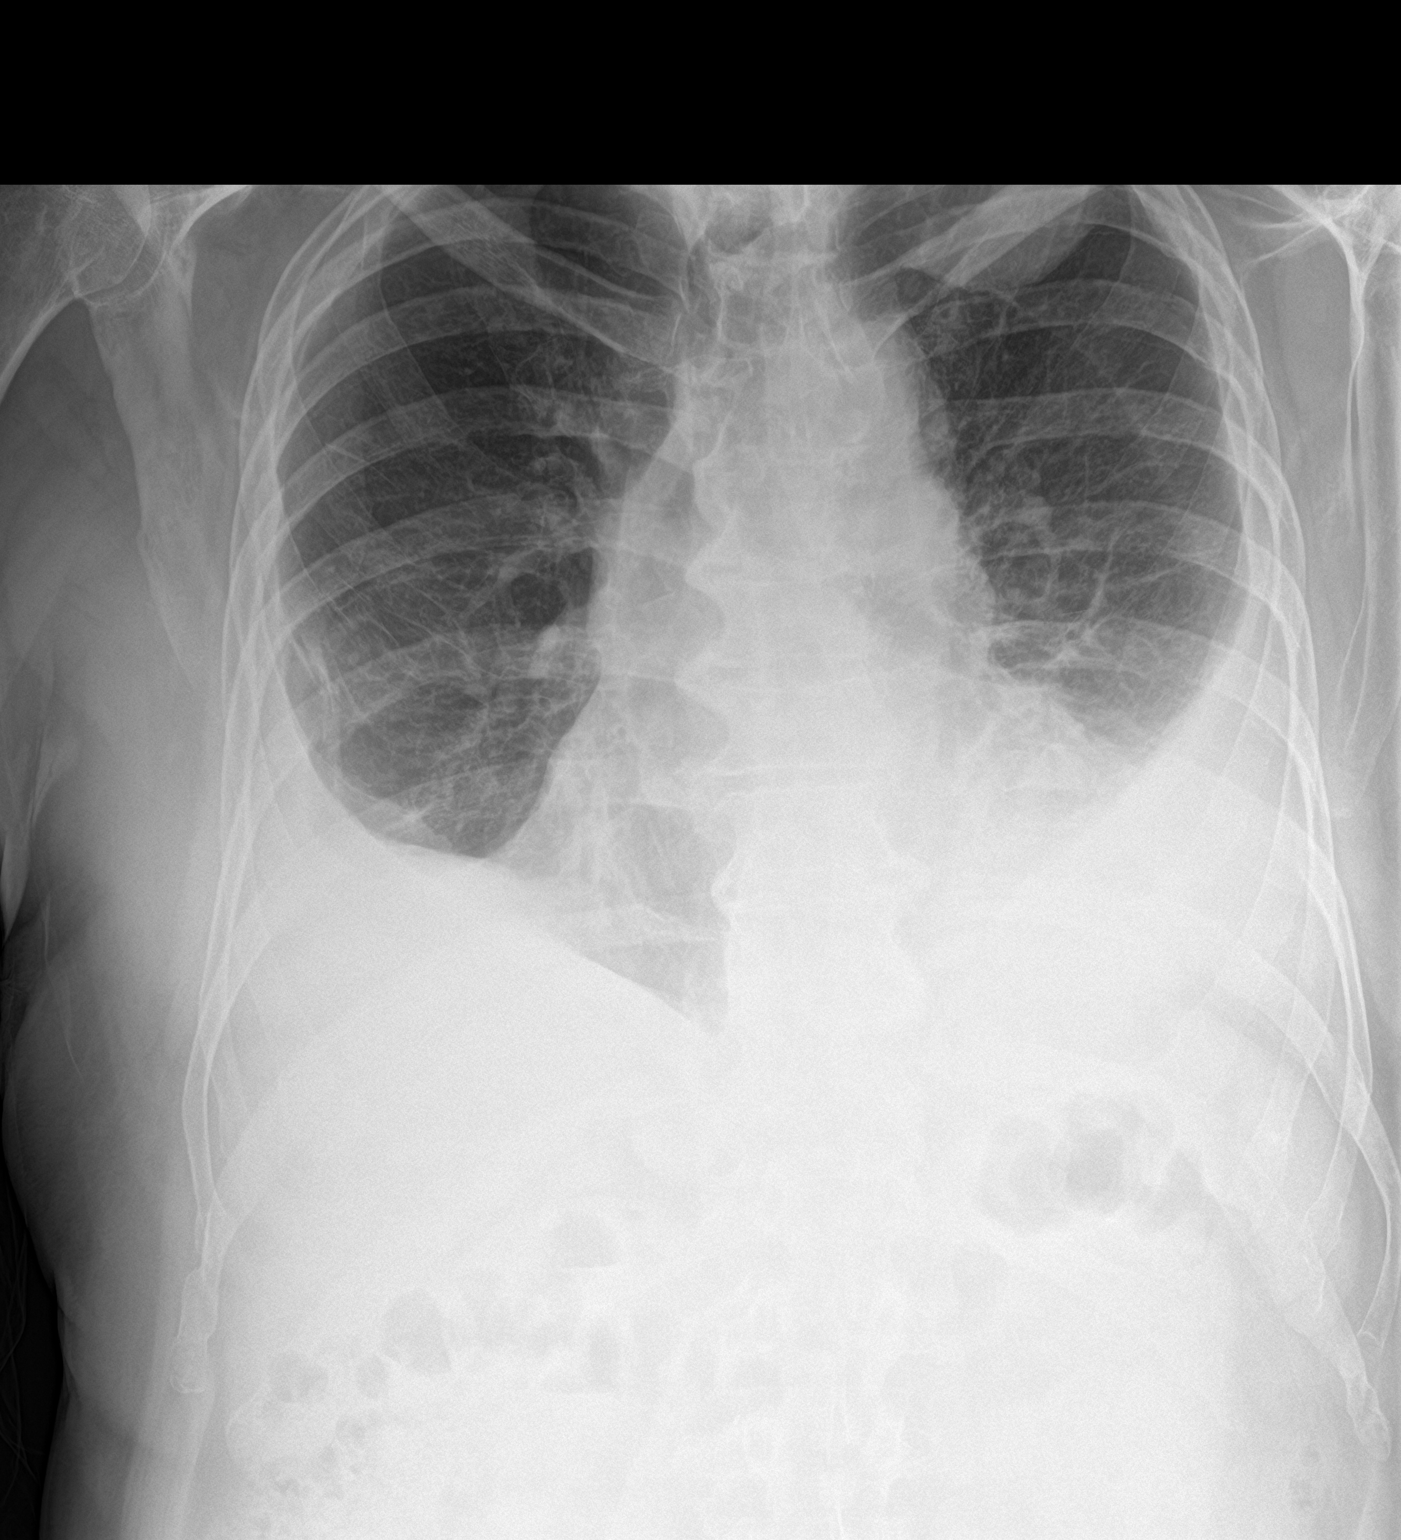

[chest lat]
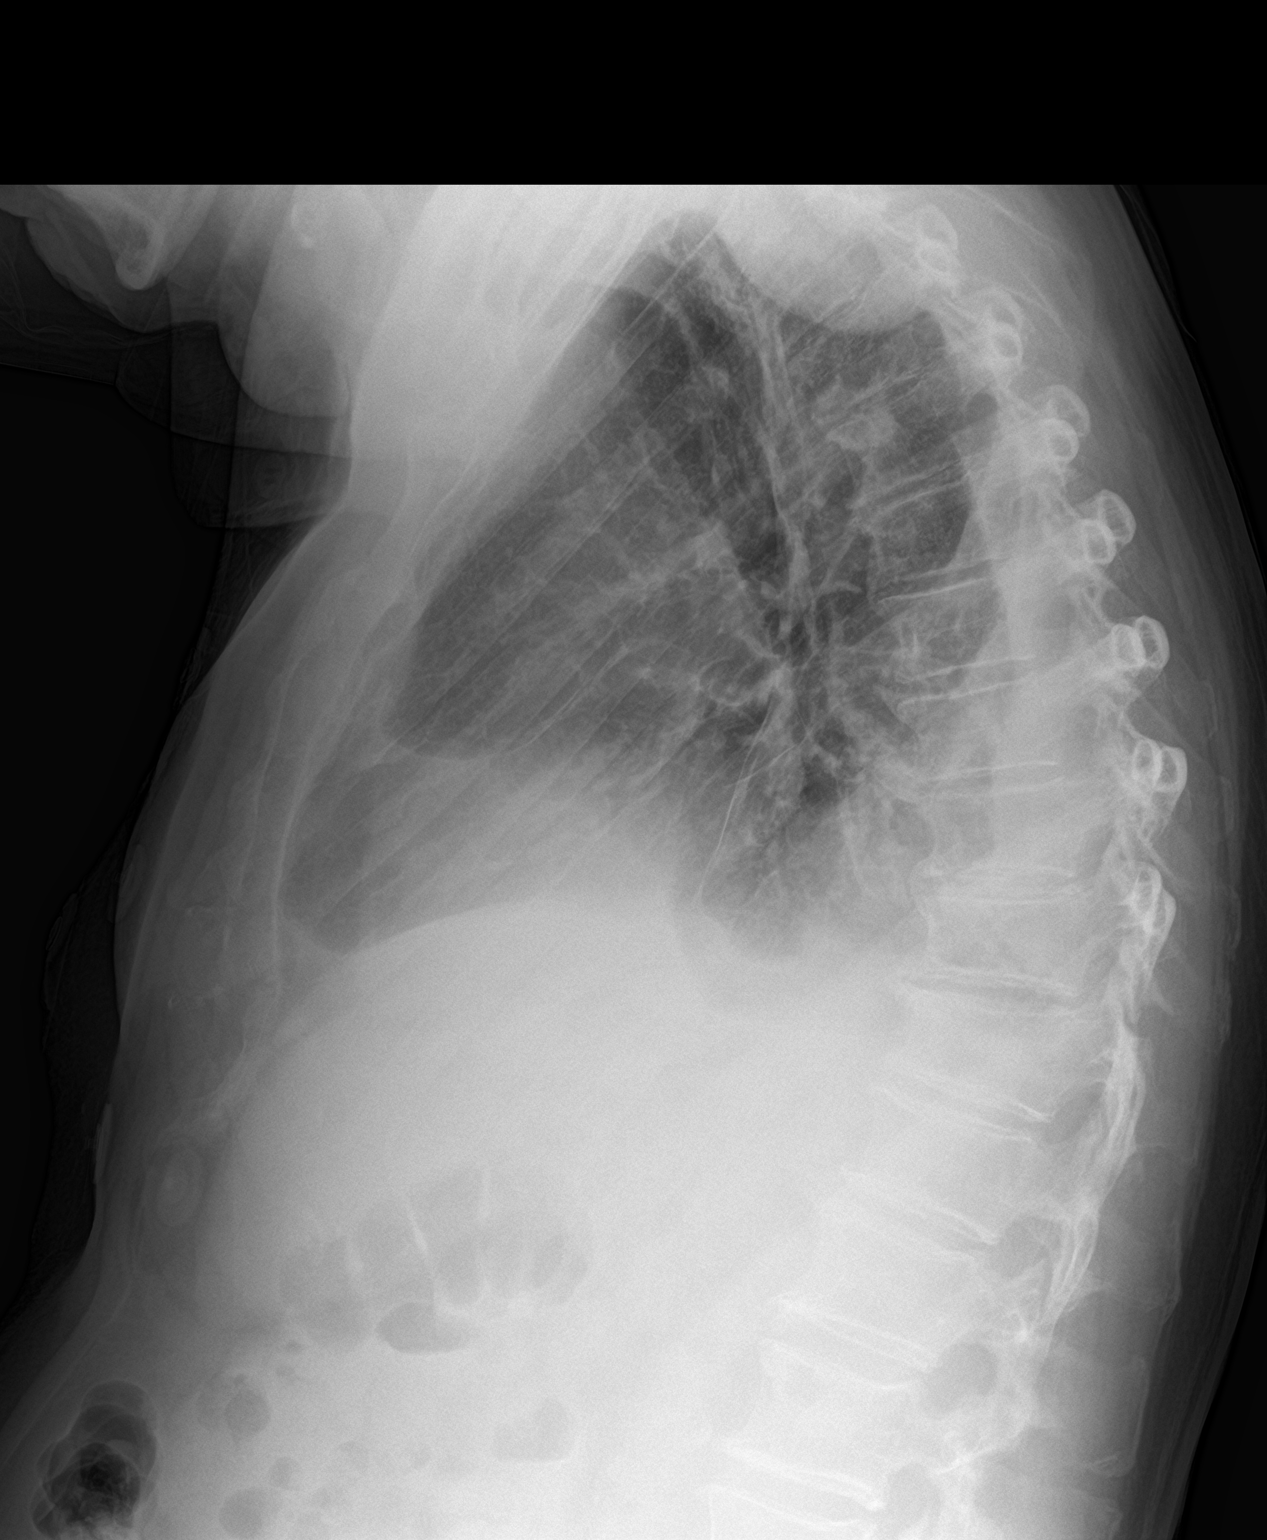

[2 of 2 positions shown; findings below may reference images not displayed]

FINDINGS: Stable cardiomediastinal silhouette. No pneumothorax is noted.
Moderate bilateral pleural effusions are noted with associated
atelectasis. Bony thorax is unremarkable.
IMPRESSION: Moderate bilateral pleural effusions with associated atelectasis.

## 2022-03-17 IMAGING — DX DG CHEST 1V PORT
1 series · 1 of 1 positions shown · non-contrast
Comparison: 05/26/2020

CLINICAL DATA: Shortness of breath for several days

EXAM:
PORTABLE CHEST 1 VIEW

[chest]
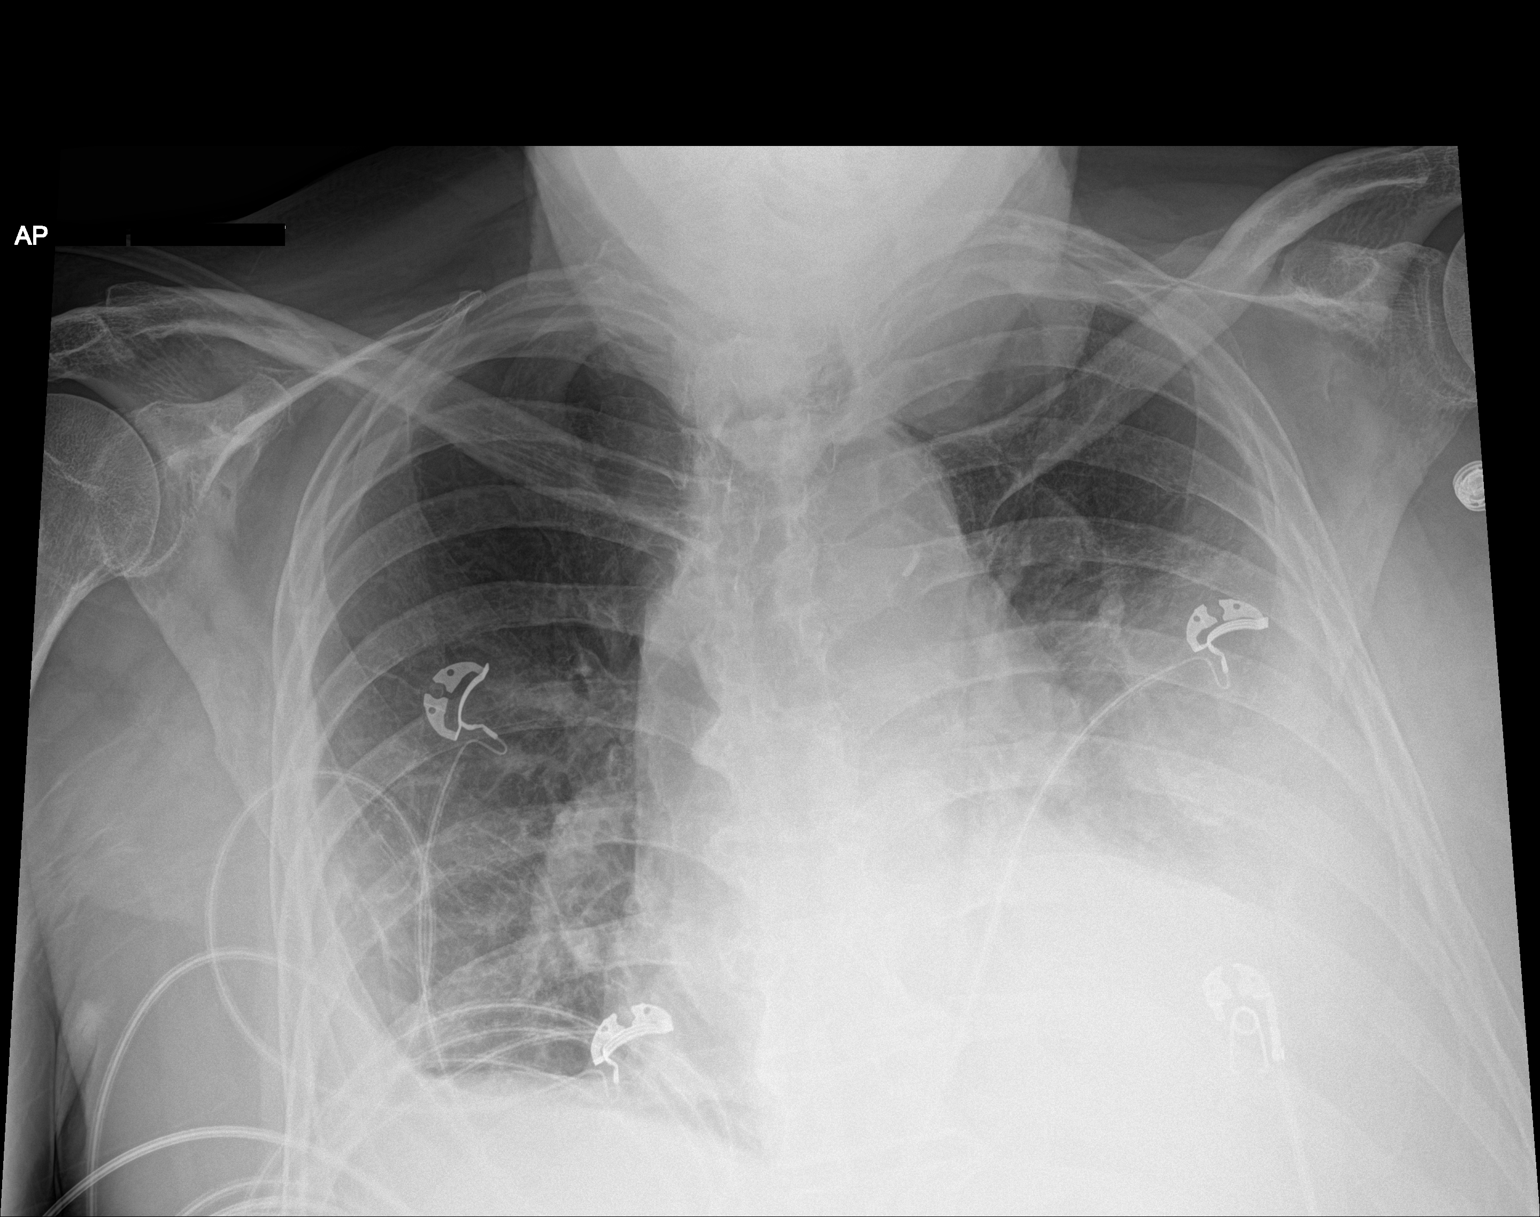

[1 of 1 positions shown; findings below may reference images not displayed]

FINDINGS: Cardiac shadow is stable. Aortic calcifications are again seen.
Bilateral pleural effusions are noted left greater than right. The
right is improved when compare with the prior exam although the left
appears increased when compared prior exam underlying
atelectasis/infiltrate is likely present. No bony abnormality is
noted at this time.
IMPRESSION: Bilateral effusions left greater than right. The left effusion has
increased in the interval from the prior exam. Right effusion
appears improved.

## 2022-03-21 IMAGING — DX DG CHEST 1V PORT
1 series · 1 of 1 positions shown · non-contrast
Comparison: Portable exam 6828 hours compared to 8093 hours

CLINICAL DATA: Chest tube removal, recent pneumonia

EXAM:
PORTABLE CHEST 1 VIEW

[chest]
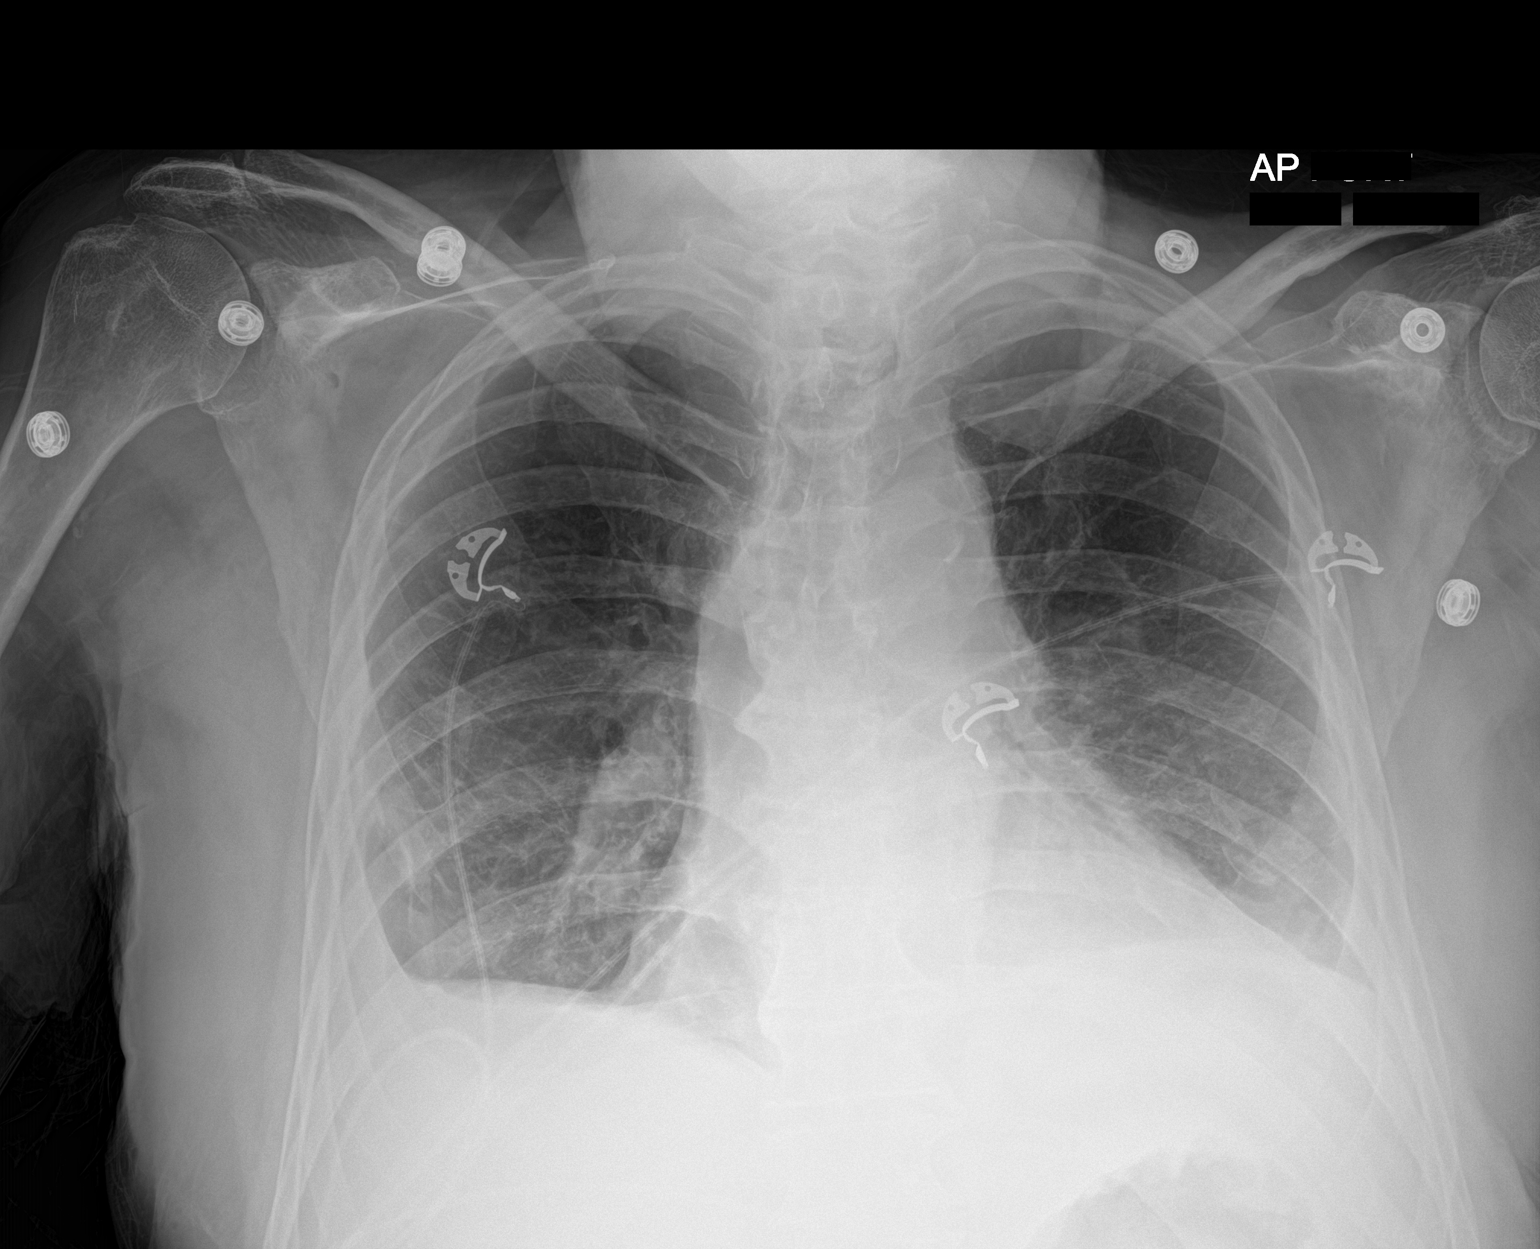

[1 of 1 positions shown; findings below may reference images not displayed]

FINDINGS: Interval removal of pigtail LEFT thoracostomy tube.

Stable heart size and mediastinal contours.

Atherosclerotic calcification aorta.

Small bibasilar pleural effusions and atelectasis.

No acute infiltrate or pneumothorax.

Endplate spur formation thoracic spine.
IMPRESSION: No pneumothorax following LEFT thoracostomy tube removal.

Small bibasilar pleural effusions and minimal atelectasis.

## 2022-03-21 IMAGING — CR DG CHEST 2V
2 series · 2 of 2 positions shown · non-contrast
Comparison: 06/02/2020.  CT 05/30/2020.

CLINICAL DATA: Shortness of breath.

EXAM:
CHEST - 2 VIEW

[chest ap]
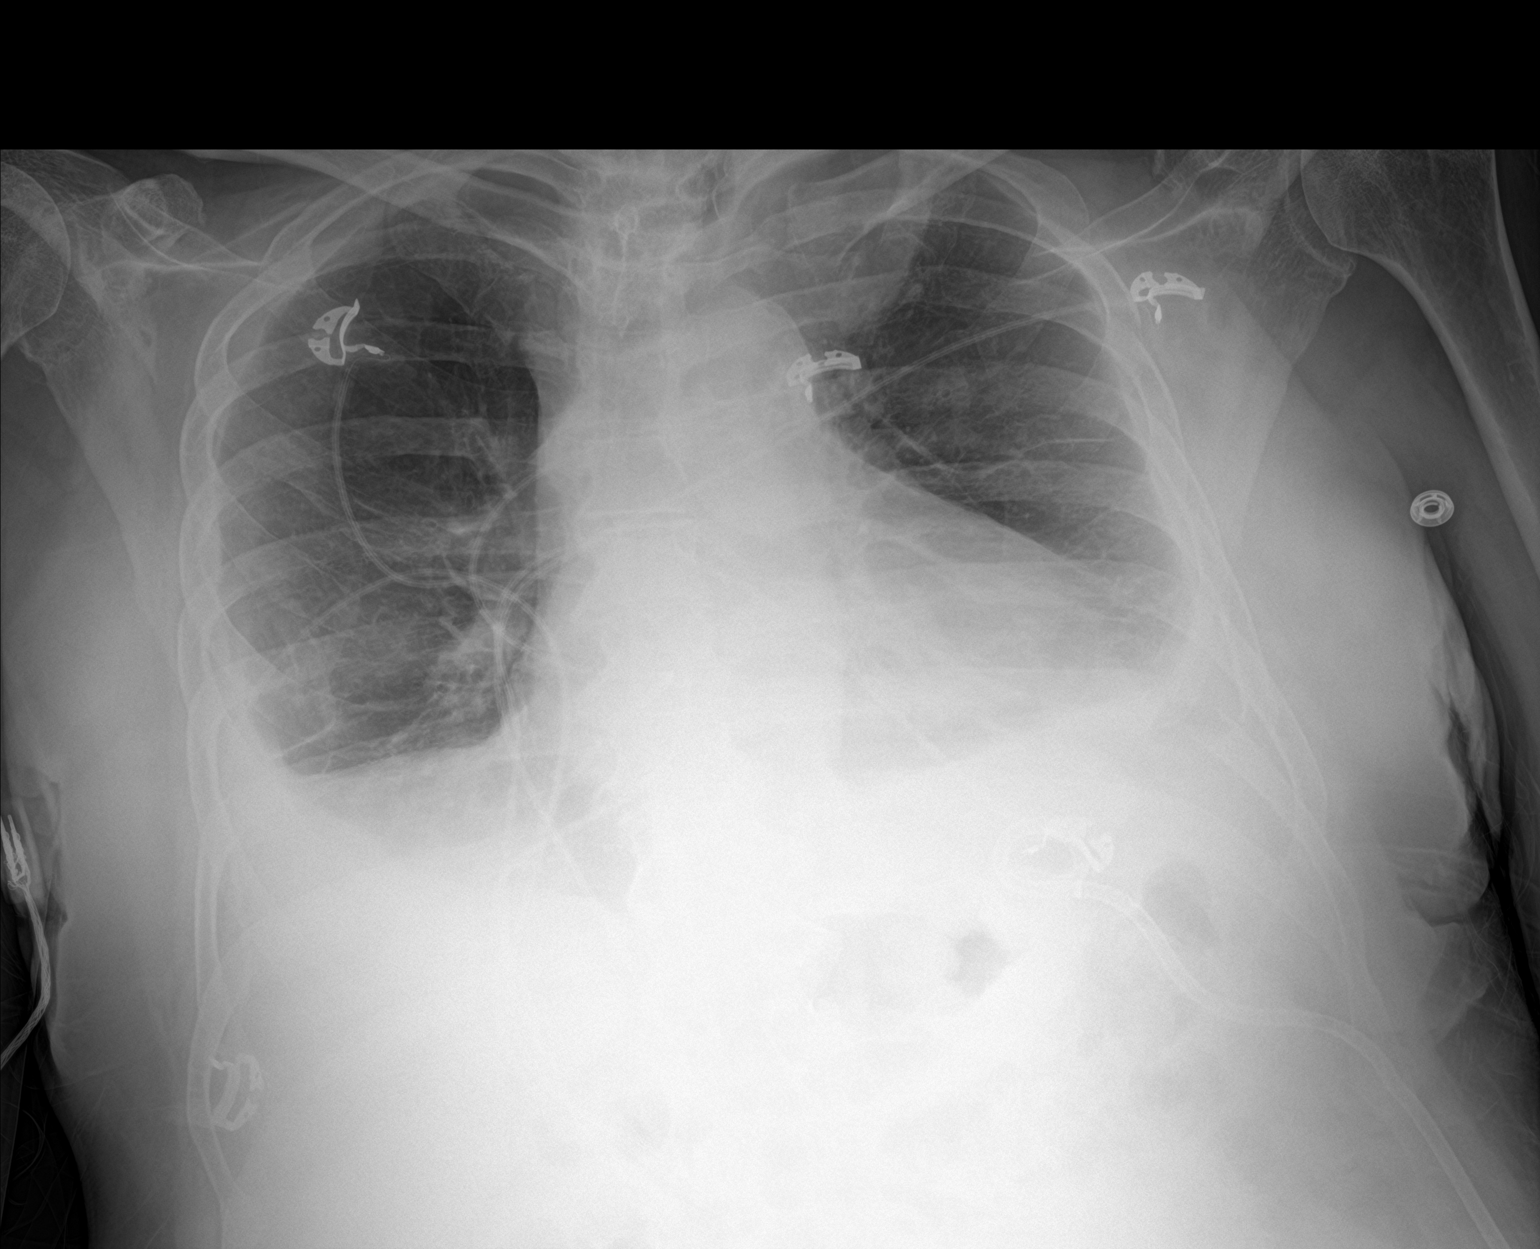

[chest lat]
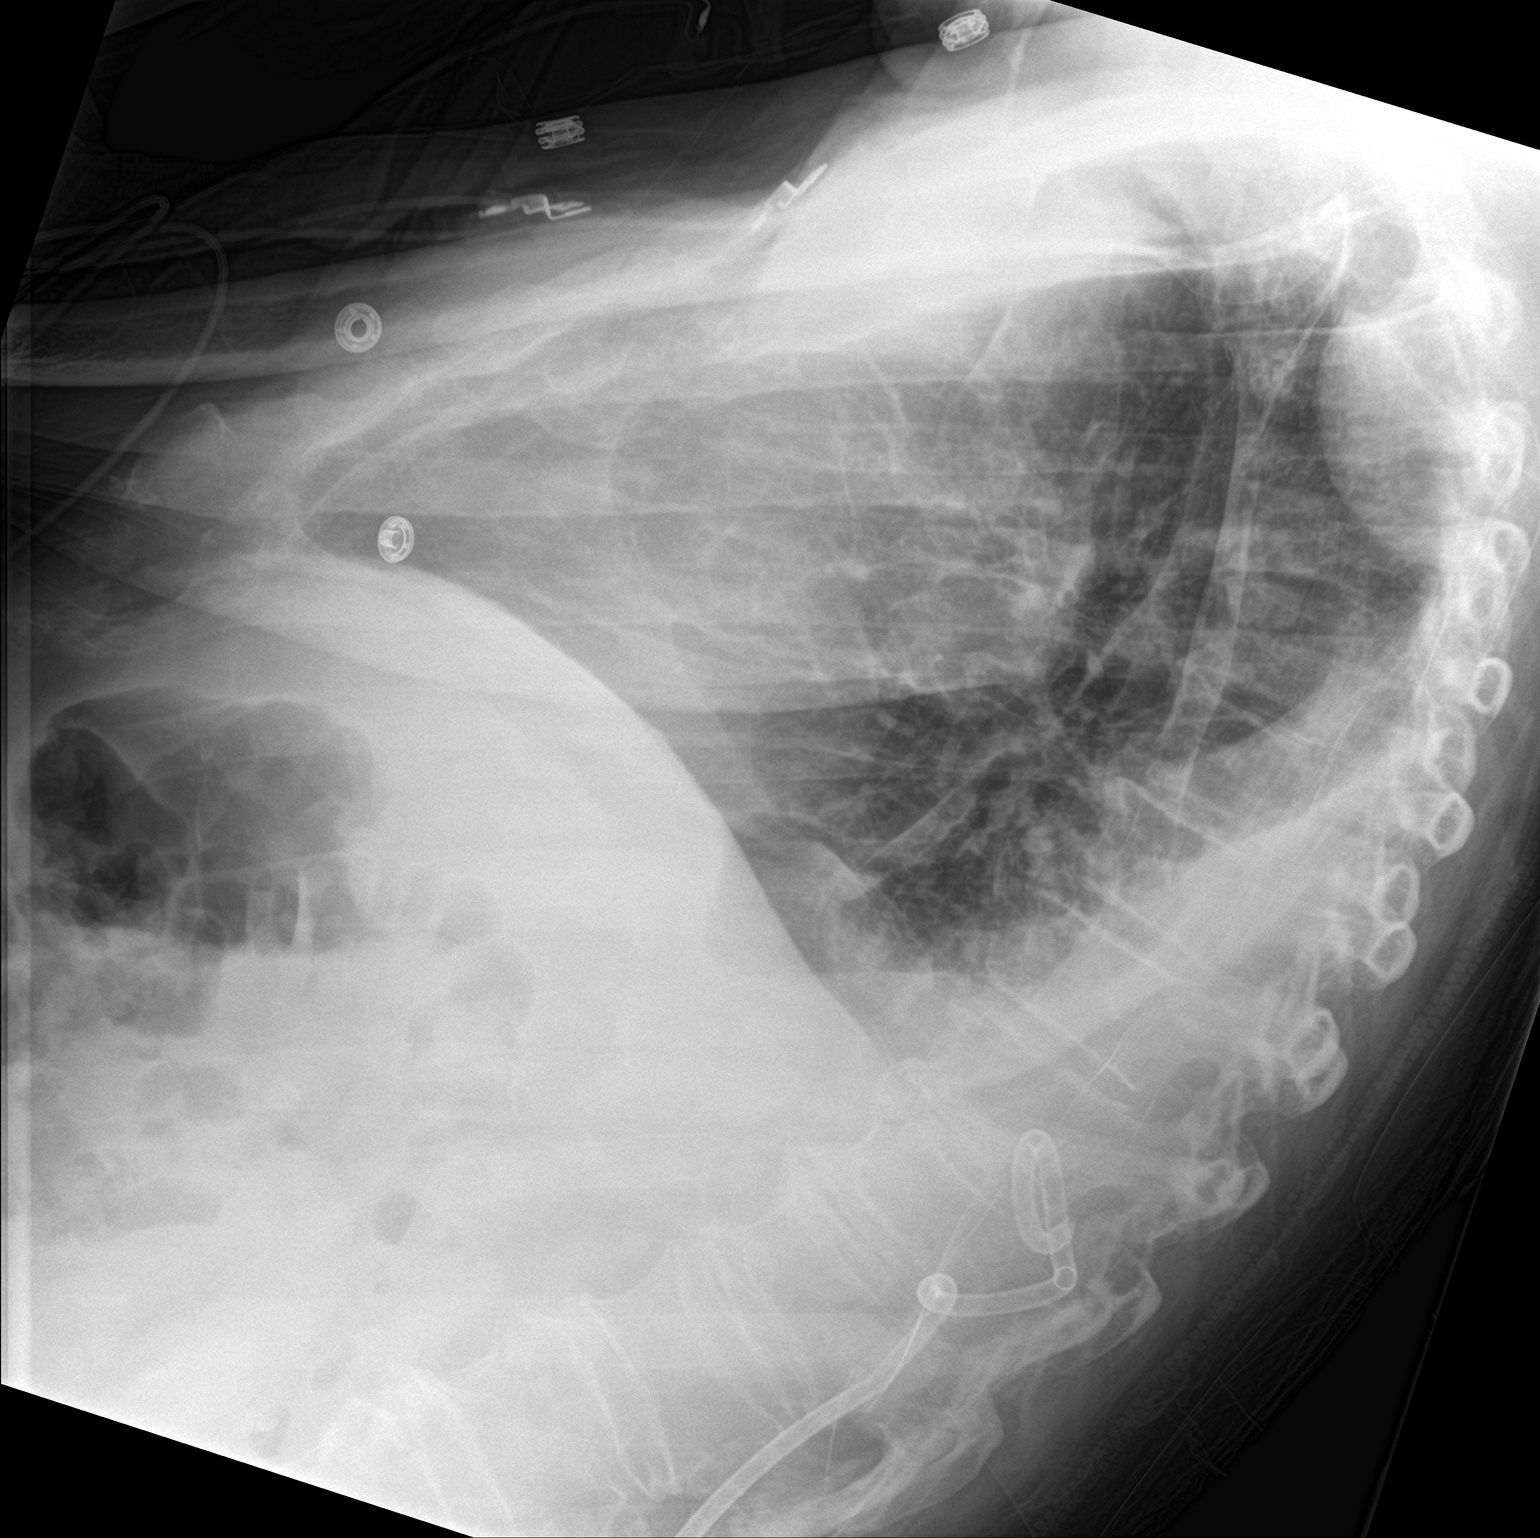

[2 of 2 positions shown; findings below may reference images not displayed]

FINDINGS: Left chest tube in stable position. No pneumothorax. Heart size
stable. Low lung volumes with bibasilar atelectasis. Small bilateral
pleural effusions again noted these may have increased slightly from
prior exam.
IMPRESSION: 1. Left chest tube in stable position. No pneumothorax.
2. Low lung volumes with bibasilar atelectasis. Small bilateral
pleural effusions again noted. These may have increased slightly
from prior exam.

## 2022-04-09 DIAGNOSIS — M545 Low back pain, unspecified: Secondary | ICD-10-CM | POA: Diagnosis not present

## 2022-07-24 ENCOUNTER — Other Ambulatory Visit: Payer: Self-pay

## 2022-07-24 ENCOUNTER — Observation Stay (HOSPITAL_COMMUNITY): Payer: Medicare HMO

## 2022-07-24 ENCOUNTER — Inpatient Hospital Stay (HOSPITAL_COMMUNITY)
Admission: EM | Admit: 2022-07-24 | Discharge: 2022-07-27 | DRG: 690 | Disposition: A | Discharge status: Home Health | Payer: Medicare HMO | Attending: Internal Medicine | Admitting: Internal Medicine

## 2022-07-24 ENCOUNTER — Emergency Department (HOSPITAL_COMMUNITY): Payer: Medicare HMO

## 2022-07-24 ENCOUNTER — Encounter (HOSPITAL_COMMUNITY): Payer: Self-pay | Admitting: Family Medicine

## 2022-07-24 DIAGNOSIS — Z79899 Other long term (current) drug therapy: Secondary | ICD-10-CM

## 2022-07-24 DIAGNOSIS — Y92009 Unspecified place in unspecified non-institutional (private) residence as the place of occurrence of the external cause: Secondary | ICD-10-CM

## 2022-07-24 DIAGNOSIS — T68XXXA Hypothermia, initial encounter: Secondary | ICD-10-CM | POA: Diagnosis not present

## 2022-07-24 DIAGNOSIS — M1612 Unilateral primary osteoarthritis, left hip: Secondary | ICD-10-CM | POA: Diagnosis present

## 2022-07-24 DIAGNOSIS — E8729 Other acidosis: Secondary | ICD-10-CM | POA: Diagnosis present

## 2022-07-24 DIAGNOSIS — E875 Hyperkalemia: Secondary | ICD-10-CM | POA: Diagnosis present

## 2022-07-24 DIAGNOSIS — D696 Thrombocytopenia, unspecified: Secondary | ICD-10-CM | POA: Diagnosis present

## 2022-07-24 DIAGNOSIS — Z7984 Long term (current) use of oral hypoglycemic drugs: Secondary | ICD-10-CM

## 2022-07-24 DIAGNOSIS — Z66 Do not resuscitate: Secondary | ICD-10-CM | POA: Diagnosis present

## 2022-07-24 DIAGNOSIS — Z794 Long term (current) use of insulin: Secondary | ICD-10-CM

## 2022-07-24 DIAGNOSIS — I4719 Other supraventricular tachycardia: Secondary | ICD-10-CM | POA: Diagnosis not present

## 2022-07-24 DIAGNOSIS — W19XXXA Unspecified fall, initial encounter: Secondary | ICD-10-CM

## 2022-07-24 DIAGNOSIS — Z1152 Encounter for screening for COVID-19: Secondary | ICD-10-CM | POA: Diagnosis not present

## 2022-07-24 DIAGNOSIS — R000 Tachycardia, unspecified: Secondary | ICD-10-CM | POA: Diagnosis not present

## 2022-07-24 DIAGNOSIS — Z7985 Long-term (current) use of injectable non-insulin antidiabetic drugs: Secondary | ICD-10-CM

## 2022-07-24 DIAGNOSIS — Z87891 Personal history of nicotine dependence: Secondary | ICD-10-CM | POA: Diagnosis not present

## 2022-07-24 DIAGNOSIS — W06XXXA Fall from bed, initial encounter: Secondary | ICD-10-CM | POA: Diagnosis present

## 2022-07-24 DIAGNOSIS — N179 Acute kidney failure, unspecified: Secondary | ICD-10-CM | POA: Diagnosis not present

## 2022-07-24 DIAGNOSIS — I4510 Unspecified right bundle-branch block: Secondary | ICD-10-CM | POA: Diagnosis present

## 2022-07-24 DIAGNOSIS — Z96642 Presence of left artificial hip joint: Secondary | ICD-10-CM | POA: Diagnosis present

## 2022-07-24 DIAGNOSIS — I6381 Other cerebral infarction due to occlusion or stenosis of small artery: Secondary | ICD-10-CM | POA: Diagnosis not present

## 2022-07-24 DIAGNOSIS — E872 Acidosis, unspecified: Secondary | ICD-10-CM | POA: Diagnosis present

## 2022-07-24 DIAGNOSIS — R0902 Hypoxemia: Secondary | ICD-10-CM | POA: Diagnosis not present

## 2022-07-24 DIAGNOSIS — I700 Atherosclerosis of aorta: Secondary | ICD-10-CM | POA: Diagnosis not present

## 2022-07-24 DIAGNOSIS — Z23 Encounter for immunization: Secondary | ICD-10-CM | POA: Diagnosis not present

## 2022-07-24 DIAGNOSIS — Z8673 Personal history of transient ischemic attack (TIA), and cerebral infarction without residual deficits: Secondary | ICD-10-CM

## 2022-07-24 DIAGNOSIS — R0602 Shortness of breath: Secondary | ICD-10-CM | POA: Diagnosis not present

## 2022-07-24 DIAGNOSIS — R7989 Other specified abnormal findings of blood chemistry: Secondary | ICD-10-CM | POA: Diagnosis not present

## 2022-07-24 DIAGNOSIS — Z7982 Long term (current) use of aspirin: Secondary | ICD-10-CM

## 2022-07-24 DIAGNOSIS — Z8701 Personal history of pneumonia (recurrent): Secondary | ICD-10-CM

## 2022-07-24 DIAGNOSIS — E11649 Type 2 diabetes mellitus with hypoglycemia without coma: Secondary | ICD-10-CM | POA: Diagnosis present

## 2022-07-24 DIAGNOSIS — N390 Urinary tract infection, site not specified: Principal | ICD-10-CM | POA: Diagnosis present

## 2022-07-24 DIAGNOSIS — I10 Essential (primary) hypertension: Secondary | ICD-10-CM | POA: Diagnosis present

## 2022-07-24 DIAGNOSIS — J90 Pleural effusion, not elsewhere classified: Secondary | ICD-10-CM | POA: Diagnosis not present

## 2022-07-24 DIAGNOSIS — R509 Fever, unspecified: Principal | ICD-10-CM

## 2022-07-24 DIAGNOSIS — Z9181 History of falling: Secondary | ICD-10-CM

## 2022-07-24 DIAGNOSIS — B961 Klebsiella pneumoniae [K. pneumoniae] as the cause of diseases classified elsewhere: Secondary | ICD-10-CM | POA: Diagnosis present

## 2022-07-24 DIAGNOSIS — J9601 Acute respiratory failure with hypoxia: Secondary | ICD-10-CM | POA: Diagnosis not present

## 2022-07-24 DIAGNOSIS — Z833 Family history of diabetes mellitus: Secondary | ICD-10-CM

## 2022-07-24 DIAGNOSIS — E860 Dehydration: Secondary | ICD-10-CM | POA: Diagnosis not present

## 2022-07-24 DIAGNOSIS — I2510 Atherosclerotic heart disease of native coronary artery without angina pectoris: Secondary | ICD-10-CM | POA: Diagnosis present

## 2022-07-24 DIAGNOSIS — R0689 Other abnormalities of breathing: Secondary | ICD-10-CM | POA: Diagnosis not present

## 2022-07-24 DIAGNOSIS — E119 Type 2 diabetes mellitus without complications: Secondary | ICD-10-CM | POA: Diagnosis not present

## 2022-07-24 DIAGNOSIS — R319 Hematuria, unspecified: Secondary | ICD-10-CM | POA: Diagnosis present

## 2022-07-24 LAB — RESPIRATORY PANEL BY PCR

## 2022-07-24 LAB — I-STAT VENOUS BLOOD GAS, ED
Acid-base deficit: 6 mmol/L — ABNORMAL HIGH (ref 0.0–2.0)
Bicarbonate: 16.6 mmol/L — ABNORMAL LOW (ref 20.0–28.0)
Calcium, Ion: 0.99 mmol/L — ABNORMAL LOW (ref 1.15–1.40)
HCT: 37 % — ABNORMAL LOW (ref 39.0–52.0)
Hemoglobin: 12.6 g/dL — ABNORMAL LOW (ref 13.0–17.0)
O2 Saturation: 95 %
Potassium: 5.4 mmol/L — ABNORMAL HIGH (ref 3.5–5.1)
Sodium: 132 mmol/L — ABNORMAL LOW (ref 135–145)
TCO2: 17 mmol/L — ABNORMAL LOW (ref 22–32)
pCO2, Ven: 25 mmHg — ABNORMAL LOW (ref 44–60)
pH, Ven: 7.429 (ref 7.25–7.43)
pO2, Ven: 72 mmHg — ABNORMAL HIGH (ref 32–45)

## 2022-07-24 LAB — URINALYSIS, ROUTINE W REFLEX MICROSCOPIC
Bilirubin Urine: NEGATIVE
Glucose, UA: NEGATIVE mg/dL
Ketones, ur: NEGATIVE mg/dL
Nitrite: NEGATIVE
Protein, ur: 30 mg/dL — AB
Specific Gravity, Urine: 1.012 (ref 1.005–1.030)
WBC, UA: >50 WBC/hpf — ABNORMAL HIGH (ref 0–5)
pH: 5 (ref 5.0–8.0)

## 2022-07-24 LAB — I-STAT CHEM 8, ED
BUN: 57 mg/dL — ABNORMAL HIGH (ref 8–23)
Calcium, Ion: 0.98 mmol/L — ABNORMAL LOW (ref 1.15–1.40)
Chloride: 105 mmol/L (ref 98–111)
Creatinine, Ser: 3.1 mg/dL — ABNORMAL HIGH (ref 0.61–1.24)
Glucose, Bld: 139 mg/dL — ABNORMAL HIGH (ref 70–99)
HCT: 39 % (ref 39.0–52.0)
Hemoglobin: 13.3 g/dL (ref 13.0–17.0)
Potassium: 5.4 mmol/L — ABNORMAL HIGH (ref 3.5–5.1)
Sodium: 133 mmol/L — ABNORMAL LOW (ref 135–145)
TCO2: 16 mmol/L — ABNORMAL LOW (ref 22–32)

## 2022-07-24 LAB — CBC WITH DIFFERENTIAL/PLATELET
Abs Immature Granulocytes: 0 10*3/uL (ref 0.00–0.07)
Band Neutrophils: 19 %
Basophils Absolute: 0 10*3/uL (ref 0.0–0.1)
Basophils Relative: 0 %
Eosinophils Absolute: 0 10*3/uL (ref 0.0–0.5)
Eosinophils Relative: 0 %
HCT: 39.4 % (ref 39.0–52.0)
Hemoglobin: 12.6 g/dL — ABNORMAL LOW (ref 13.0–17.0)
Lymphocytes Relative: 3 %
Lymphs Abs: 0.2 10*3/uL — ABNORMAL LOW (ref 0.7–4.0)
MCH: 29.4 pg (ref 26.0–34.0)
MCHC: 32 g/dL (ref 30.0–36.0)
MCV: 91.8 fL (ref 80.0–100.0)
Monocytes Absolute: 0.1 10*3/uL (ref 0.1–1.0)
Monocytes Relative: 1 %
Neutro Abs: 6.4 10*3/uL (ref 1.7–7.7)
Neutrophils Relative %: 77 %
Platelets: 126 10*3/uL — ABNORMAL LOW (ref 150–400)
RBC: 4.29 MIL/uL (ref 4.22–5.81)
RDW: 13.3 % (ref 11.5–15.5)
Smear Review: ADEQUATE
WBC: 6.7 10*3/uL (ref 4.0–10.5)
nRBC: 0 % (ref 0.0–0.2)

## 2022-07-24 LAB — RESP PANEL BY RT-PCR (FLU A&B, COVID) ARPGX2
Influenza A by PCR: NEGATIVE
Influenza B by PCR: NEGATIVE
SARS Coronavirus 2 by RT PCR: NEGATIVE

## 2022-07-24 LAB — COMPREHENSIVE METABOLIC PANEL
ALT: 63 U/L — ABNORMAL HIGH (ref 0–44)
AST: 102 U/L — ABNORMAL HIGH (ref 15–41)
Albumin: 2.8 g/dL — ABNORMAL LOW (ref 3.5–5.0)
Alkaline Phosphatase: 195 U/L — ABNORMAL HIGH (ref 38–126)
Anion gap: 19 — ABNORMAL HIGH (ref 5–15)
BUN: 64 mg/dL — ABNORMAL HIGH (ref 8–23)
CO2: 15 mmol/L — ABNORMAL LOW (ref 22–32)
Calcium: 8.3 mg/dL — ABNORMAL LOW (ref 8.9–10.3)
Chloride: 101 mmol/L (ref 98–111)
Creatinine, Ser: 2.98 mg/dL — ABNORMAL HIGH (ref 0.61–1.24)
GFR, Estimated: 21 mL/min — ABNORMAL LOW (ref >60)
Glucose, Bld: 142 mg/dL — ABNORMAL HIGH (ref 70–99)
Potassium: 5.5 mmol/L — ABNORMAL HIGH (ref 3.5–5.1)
Sodium: 135 mmol/L (ref 135–145)
Total Bilirubin: 1 mg/dL (ref 0.3–1.2)
Total Protein: 6.1 g/dL — ABNORMAL LOW (ref 6.5–8.1)

## 2022-07-24 LAB — BRAIN NATRIURETIC PEPTIDE: B Natriuretic Peptide: 87.2 pg/mL (ref 0.0–100.0)

## 2022-07-24 LAB — TROPONIN I (HIGH SENSITIVITY)
Troponin I (High Sensitivity): 18 ng/L — ABNORMAL HIGH (ref <18)
Troponin I (High Sensitivity): 27 ng/L — ABNORMAL HIGH (ref <18)

## 2022-07-24 LAB — CBG MONITORING, ED: Glucose-Capillary: 133 mg/dL — ABNORMAL HIGH (ref 70–99)

## 2022-07-24 MED ORDER — ACETAMINOPHEN 325 MG PO TABS: 650.0000 mg | ORAL_TABLET | Freq: Four times a day (QID) | ORAL | Status: DC | PRN

## 2022-07-24 MED ORDER — ACETAMINOPHEN 500 MG PO TABS
1000.0000 mg | ORAL_TABLET | Freq: Once | ORAL | Status: AC
  Administered 2022-07-24: 1000 mg via ORAL
  Filled 2022-07-24: qty 2

## 2022-07-24 MED ORDER — INSULIN ASPART 100 UNIT/ML IJ SOLN
0.0000 [IU] | Freq: Three times a day (TID) | INTRAMUSCULAR | Status: DC
  Administered 2022-07-25 (×2): 3 [IU] via SUBCUTANEOUS
  Administered 2022-07-26: 2 [IU] via SUBCUTANEOUS
  Administered 2022-07-26 (×2): 5 [IU] via SUBCUTANEOUS
  Administered 2022-07-27: 3 [IU] via SUBCUTANEOUS

## 2022-07-24 MED ORDER — LACTATED RINGERS IV SOLN: INTRAVENOUS | Status: DC

## 2022-07-24 MED ORDER — METOPROLOL TARTRATE 5 MG/5ML IV SOLN: 5.0000 mg | Freq: Four times a day (QID) | INTRAVENOUS | Status: DC | PRN

## 2022-07-24 MED ORDER — ACETAMINOPHEN 650 MG RE SUPP: 650.0000 mg | Freq: Four times a day (QID) | RECTAL | Status: DC | PRN

## 2022-07-24 MED ORDER — SODIUM CHLORIDE 0.9 % IV BOLUS
1000.0000 mL | Freq: Once | INTRAVENOUS | Status: AC
  Administered 2022-07-24: 1000 mL via INTRAVENOUS

## 2022-07-24 MED ORDER — SODIUM ZIRCONIUM CYCLOSILICATE 10 G PO PACK
10.0000 g | PACK | Freq: Once | ORAL | Status: AC
  Administered 2022-07-24: 10 g via ORAL
  Filled 2022-07-24: qty 1

## 2022-07-24 MED ORDER — SODIUM CHLORIDE 0.9 % IV SOLN
1.0000 g | Freq: Once | INTRAVENOUS | Status: AC
  Administered 2022-07-24: 1 g via INTRAVENOUS
  Filled 2022-07-24: qty 10

## 2022-07-24 MED ORDER — INSULIN ASPART 100 UNIT/ML IJ SOLN
0.0000 [IU] | Freq: Every day | INTRAMUSCULAR | Status: DC
  Administered 2022-07-25: 3 [IU] via SUBCUTANEOUS

## 2022-07-24 MED ORDER — SODIUM CHLORIDE 0.9 % IV SOLN
500.0000 mg | Freq: Once | INTRAVENOUS | Status: AC
  Administered 2022-07-24: 500 mg via INTRAVENOUS
  Filled 2022-07-24: qty 5

## 2022-07-24 NOTE — ED Provider Notes (Addendum)
[facility] EMERGENCY DEPARTMENT Provider Note   CSN: [id number] Arrival date & time: 07/24/22  1527     History  Chief Complaint  Patient presents with   Code Sepsis    Eduardo Murray is a 81 y.o. male.  81 year old male with prior medical history as detailed below presents for evaluation.  Patient arrives by EMS transport from home.  Patient with approximately 24 hours of subjective fever and chills.  Patient with complaint of myalgias as well.  With EMS patient was noted to be tachypneic and tachycardic.  Patient was complaining of moderate shortness of breath.  Room air saturations with EMS were in the mid 80s.  With 4 L nasal cannula supplemental O2 patient sats improved to the low 90s.  Patient reports home COVID testing.  Initially he reported that it was a positive and then he change his mind and reported that this was a negative.  Patient denies abdominal pain or chest pain.  He denies change in bowel movements.  He denies urinary symptoms.  Patient is alert and comfortable on initial evaluation.  Patient is reporting that he feels improved.    The history is provided by the patient and medical records.       Home Medications Prior to Admission medications   Medication Sig Start Date End Date Taking? Authorizing Provider  aspirin EC 81 MG tablet Take 81 mg by mouth daily. Swallow whole.    [provider]  baclofen (LIORESAL) 10 MG tablet Take 1 tablet (10 mg total) by mouth 3 (three) times daily. As needed for muscle spasm 03/24/19   Marchia Bond, MD  Dulaglutide (TRULICITY) 1.5 IP/3.8SN SOPN Inject 1.5 mg into the skin every Sunday.    [provider]  furosemide (LASIX) 20 MG tablet Take 1 tablet (20 mg total) by mouth daily. Pt needs to make appt with provider for additional refills 06/22/21   Rudean Haskell A, MD  gabapentin (NEURONTIN) 300 MG capsule Take 600 mg by mouth at bedtime.    [provider]   insulin glargine, 2 Unit Dial, (TOUJEO MAX SOLOSTAR) 300 UNIT/ML Solostar Pen Inject 24 Units into the skin daily. 06/04/20   Sheikh, Omair Latif, DO  lisinopril (ZESTRIL) 40 MG tablet Take 40 mg by mouth daily. 09/26/20   [provider]  magnesium oxide (MAG-OX) 400 (241.3 Mg) MG tablet Take 1 tablet (400 mg total) by mouth daily. 06/30/20   Chandrasekhar, Terisa Starr, MD  metFORMIN (GLUCOPHAGE) 1000 MG tablet Take 1,000 mg by mouth 2 (two) times daily with a meal.    [provider]  Multiple Vitamin (MULTIVITAMIN WITH MINERALS) TABS tablet Take 1 tablet by mouth daily. 06/05/20   Sheikh, Omair Latif, DO  pantoprazole (PROTONIX) 40 MG tablet Take 40 mg by mouth daily.  02/17/14   [provider]  traZODone (DESYREL) 50 MG tablet Take 100 mg by mouth at bedtime.    [provider]  vitamin B-12 (CYANOCOBALAMIN) 1000 MCG tablet Take 1,000 mcg by mouth daily.    [provider]      Allergies    Patient has no known allergies.    Review of Systems   Review of Systems  All other systems reviewed and are negative.   Physical Exam Updated Vital Signs BP 116/72   Pulse (!) 128   Temp (!) 102.6 F (39.2 C) (Oral)   Resp (!) 24   SpO2 96%  Physical Exam Vitals and nursing note reviewed.  Constitutional:      General: He is not in acute distress.    Appearance: Normal appearance. He is well-developed.  HENT:     Head: Normocephalic and atraumatic.  Eyes:     Conjunctiva/sclera: Conjunctivae normal.     Pupils: Pupils are equal, round, and reactive to light.  Cardiovascular:     Rate and Rhythm: Regular rhythm. Tachycardia present.     Heart sounds: Normal heart sounds.  Pulmonary:     Effort: Pulmonary effort is normal. No respiratory distress.     Breath sounds: Normal breath sounds.  Abdominal:     General: There is no distension.     Palpations: Abdomen is soft.     Tenderness: There is no abdominal tenderness.  Musculoskeletal:         General: No deformity. Normal range of motion.     Cervical back: Normal range of motion and neck supple.  Skin:    General: Skin is warm and dry.  Neurological:     General: No focal deficit present.     Mental Status: He is alert and oriented to person, place, and time.     ED Results / Procedures / Treatments   Labs (all labs ordered are listed, but only abnormal results are displayed) Labs Reviewed  CBC WITH DIFFERENTIAL/PLATELET - Abnormal; Notable for the following components:      Result Value   Hemoglobin 12.6 (*)    Platelets 126 (*)    All other components within normal limits  RESP PANEL BY RT-PCR (FLU A&B, COVID) ARPGX2  CULTURE, BLOOD (ROUTINE X 2)  CULTURE, BLOOD (ROUTINE X 2)  URINE CULTURE  COMPREHENSIVE METABOLIC PANEL  BRAIN NATRIURETIC PEPTIDE  URINALYSIS, ROUTINE W REFLEX MICROSCOPIC  I-STAT CHEM 8, ED  I-STAT VENOUS BLOOD GAS, ED  TROPONIN I (HIGH SENSITIVITY)    EKG EKG Interpretation  Date/Time:  Tuesday July 24 2022 15:38:08 EST Ventricular Rate:  129 PR Interval:    QRS Duration: 138 QT Interval:  354 QTC Calculation: 519 R Axis:   -55 Text Interpretation: 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 Hypertension and diabetes. EXAM: PORTABLE CHEST 1 VIEW COMPARISON:  06/04/2020 and chest CT from 01/20/2021 FINDINGS: Low lung volumes are present, causing crowding of the pulmonary vasculature. Blunting of both lateral costophrenic angles. Atherosclerotic calcification of the aortic arch. Borderline enlargement of the cardiopericardial silhouette, without overt edema. Thoracic spondylosis. IMPRESSION: 1. Borderline enlargement of the cardiopericardial silhouette, without edema. 2. Blunting of both lateral costophrenic angles, favoring  scarring over small bilateral pleural effusions. 3. Thoracic spondylosis. 4. Low lung volumes. Electronically Signed   By: Van Clines M.D.   On: 07/24/2022 16:05    Procedures Procedures    Medications Ordered in ED Medications  cefTRIAXone (ROCEPHIN) 1 g in sodium chloride 0.9 % 100 mL IVPB (1 g Intravenous New Bag/Given 07/24/22 1602)  azithromycin (ZITHROMAX) 500 mg in sodium chloride 0.9 % 250 mL IVPB (has no administration in time range)  acetaminophen (TYLENOL) tablet 1,000 mg (1,000 mg Oral Given 07/24/22 1601)    ED Course/ Medical Decision Making/ A&P                           Medical Decision Making Amount and/or Complexity of Data Reviewed  Labs: ordered. Radiology: ordered.  Risk OTC drugs. Decision regarding hospitalization.    Medical Screen Complete  This patient presented to the ED with complaint of fever, weakness, shortness of breath.  This complaint involves an extensive number of treatment options. The initial differential diagnosis includes, but is not limited to, bacterial versus viral infection, pneumonia, AKI, metabolic abnormality, dehydration, etc.  This presentation is: Acute, Chronic, Self-Limited, Previously Undiagnosed, Uncertain Prognosis, Complicated, Systemic Symptoms, and Threat to Life/Bodily Function  Patient presents with complaint of shortness of breath, cough, weakness, fever, chills, body aches.  Symptoms began approximate 24 hours prior to arrival.  Patient noted to be febrile and hypoxic on arrival.  With supplemental O2 patient feels improved.  Patient's work-up is remarkable for new O2 requirement, elevated BUN and creatinine, and fever on arrival of 102.  Patient appears to be improving with control of fever and IV fluids.  Antibiotics initiated to treat possible occult pneumonia given fever and hypoxia.  Patient's labs are demonstrative of AKI.  Additionally UA is concerning for infection.  Patient without reported  urinary symptoms.  Patient will require admission for further work-up and treatment.   Additional history obtained:  Additional history obtained from Spouse External records from outside sources obtained and reviewed including prior ED visits and prior Inpatient records.    Lab Tests:  I ordered and personally interpreted labs.  The pertinent results include: CBC, CMP, i-STAT VBG, i-STAT Chem-8, troponin UA, urine culture   Imaging Studies ordered:  I ordered imaging studies including chest x-ray I independently visualized and interpreted obtained imaging which showed NAD I agree with the radiologist interpretation.   Cardiac Monitoring:  The patient was maintained on a cardiac monitor.  I personally viewed and interpreted the cardiac monitor which showed an underlying rhythm of: Sinus tach   Medicines ordered:  I ordered medication including IV fluids, antibiotics for dehydration, suspected bacterial infection Reevaluation of the patient after these medicines showed that the patient: improved   Problem List / ED Course:  Fever, hypoxia, dehydration, AKI   Reevaluation:  After the interventions noted above, I reevaluated the patient and found that they have: improved   Disposition:  After consideration of the diagnostic results and the patients response to treatment, I feel that the patent would benefit from admission.    CRITICAL CARE Performed by: Valarie Merino   Total critical care time: 30 minutes  Critical care time was exclusive of separately billable procedures and treating other patients.  Critical care was necessary to treat or prevent imminent or life-threatening deterioration.  Critical care was time spent personally by me on the following activities: development of treatment plan with patient and/or surrogate as well as nursing, discussions with consultants, evaluation of patient's response to treatment, examination of patient, obtaining  history from patient or surrogate, ordering and performing treatments and interventions, ordering and review of laboratory studies, ordering and review of radiographic studies, pulse oximetry and re-evaluation of patient's condition.          Final Clinical Impression(s) / ED Diagnoses Final diagnoses:  Fever, unspecified fever cause  AKI (acute kidney injury) ([place])  Hypoxia    Rx / DC Orders ED Discharge Orders     None         Valarie Merino, MD 07/24/22 1721    Valarie Merino, MD 07/24/22 1721

## 2022-07-24 NOTE — H&P (Signed)
History and Physical    Patient: Eduardo Murray TIW:[id number] DOB: 07/13/1941 DOA: 07/24/2022 DOS: the patient was seen and examined on 07/24/2022 PCP: Deland Pretty, MD  Patient coming from: Home by EMS  Chief Complaint:  Chief Complaint  Patient presents with   Code Sepsis   HPI: Eduardo Murray is an 81 y.o. male with a history of HTN, T2DM, remote tobacco use, history pneumonia and pleural effusions who presented to the ED with 2-3 days of shaking chills found to be febrile. He reports gradual onset of worsening to severe shaking chills that occurred without provocation. His granddaughter who is in nursing school tested him for covid and was negative. One of the the times he drank some orange juice and blood sugar came up to 120's and he felt better. But most of the time he took nothing for it but finally relented and came in today. He has been feeling more generally weak and even rolled out of bed hitting his dresser and liked to never got up, but when he finally did his gait has been normal. He's unclear whether he hit his head. He did not report and other symptoms.  On review of systems he does tell me his urine is getting darker, it's been burning when he pees, and he's urinating very frequently, changes that have come about over the past week or two. He was found to be hypoxic in the ED, told EDP he felt better after oxygen was applied, though tylenol was also given and his fever resolving may explain that. He denies any shortness of breath, cough, leg swelling, orthopnea, PND. No sick contacts, sore throat, rhinorrhea, congestion.   Review of Systems: As mentioned in the history of present illness. All other systems reviewed and are negative. 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 His smoking use included cigarettes. He has a 30.00 pack-year smoking history. He quit smokeless tobacco use about 14 years ago.  His smokeless tobacco use included snuff. He reports that he does not drink alcohol and does not use drugs. Retired Administrator, lives with wife. Largely independent.  No Known Allergies  Family History  Problem Relation Age of Onset   Cancer Father    Diabetes Mother    Cancer Sister    Diabetes Sister    Diabetes Brother     Prior to Admission medications   Medication Sig Start Date End Date Taking? Authorizing Provider  aspirin EC 81 MG tablet Take 81 mg by mouth daily. Swallow whole.    [provider]  baclofen (LIORESAL) 10 MG tablet Take 1 tablet (10 mg total) by mouth 3 (three) times daily. As needed for muscle spasm 03/24/19   Marchia Bond, MD  Dulaglutide (TRULICITY) 1.5 SN/0.5LZ SOPN Inject 1.5 mg into the skin every Sunday.    [provider]  furosemide (  LASIX) 20 MG tablet Take 1 tablet (20 mg total) by mouth daily. Pt needs to make appt with provider for additional refills 06/22/21   Rudean Haskell A, MD  gabapentin (NEURONTIN) 300 MG capsule Take 600 mg by mouth at bedtime.    [provider]  insulin glargine, 2 Unit Dial, (TOUJEO MAX SOLOSTAR) 300 UNIT/ML Solostar Pen Inject 24 Units into the skin daily. 06/04/20   Sheikh, Omair Latif, DO  lisinopril (ZESTRIL) 40 MG tablet Take 40 mg by mouth daily. 09/26/20   [provider]  magnesium oxide (MAG-OX) 400 (241.3 Mg) MG tablet Take 1 tablet (400 mg total) by mouth daily. 06/30/20    Chandrasekhar, Terisa Starr, MD  metFORMIN (GLUCOPHAGE) 1000 MG tablet Take 1,000 mg by mouth 2 (two) times daily with a meal.    [provider]  Multiple Vitamin (MULTIVITAMIN WITH MINERALS) TABS tablet Take 1 tablet by mouth daily. 06/05/20   Sheikh, Omair Latif, DO  pantoprazole (PROTONIX) 40 MG tablet Take 40 mg by mouth daily.  02/17/14   [provider]  traZODone (DESYREL) 50 MG tablet Take 100 mg by mouth at bedtime.    [provider]  vitamin B-12 (CYANOCOBALAMIN) 1000 MCG tablet Take 1,000 mcg by mouth daily.    [provider]    Physical Exam: Vitals:   07/24/22 1630 07/24/22 1700 07/24/22 1730 07/24/22 1734  BP: 125/69 101/71 107/65   Pulse: (!) 113 (!) 117 (!) 112   Resp: (!) 30 20 (!) 31   Temp:    99.1 F (37.3 C)  TempSrc:    Oral  SpO2: 95% 96% 97%   Gen: Pleasant, HOH elderly male in no distress Pulm: Nonlabored breathing room air, SpO2 >95% after I took him off oxygen. Right base does have come crackles, otherwise clear without wheezes. CV: Regular narrow complex with rate 90-100's, no murmur, rub, or gallop. No JVD, no dependent edema. GI: Abdomen soft, non-tender, non-distended, with normoactive bowel sounds.  Ext: Warm, no deformities Skin: Ecchymoses bilateral forearms, hands, no open wounds, rashes, lesions or ulcers on visualized skin. Neuro: Alert and oriented. No focal neurological deficits. Psych: Judgement and insight appear fair. Mood euthymic & affect congruent. Behavior is appropriate.    Data Reviewed: CXR with hypoventilation, on AP film there's enlargement of cardiomediastinal silhouette. No interstitial edema or focal infiltrate. CPA blunting noted bilaterally concerning for small bilateral effusions vs. scarring    CT chest 01/21/2021:  Trace left and moderate right pleural effusions with associated partial compressive atelectasis of the lower lobes versus pneumonia.  ECG: Appears to be MAT to me, similar to prior  ECG.   Covid, flu negative.  WBC 6.7k, ALC 200. K 5.5, SCr 2.98, BUN 64, bicarb 15.   Assessment and Plan: Acute hypoxic respiratory failure: Suspected to be due to pneumonia, though CXR not convincing at this time. - Continue supplemental oxygen to maintain SpO2 >90% and normal WOB. I took him off oxygen and he did not desaturate in the next 10 minutes.  - Continue ceftriaxone to cover for UTI as well. Received azithromycin, will not continue at this time  - Lymphopenia noted, covid test here and at home negative. Will check full viral panel. - Follow up blood cultures, add sputum culture if possible.  UTI: Pt has fever, shaking chills, and symptomatic pyuria, bacteriuria.  - Continue ceftriaxone as above - Urine culture has been sent.  - With this and AKI, and frequent urination, will check renal U/S.  Multifocal atrial tachycardia:  - Rate improving with fluids, consider cardioselective beta blocker if remains elevated.  - metoprolol IV prn  Microscopic hematuria:  - Recheck UA w/micro at follow up given hx smoking.   Hyperkalemia: Mild, driven by acidosis, AKI - Lokelma - Renal diet for now, but don't need fluid restriction - Continue monitoring  AKI with anion gap metabolic acidosis: Suspect prerenal with hx feeling unwell, possible poor po intake, hyaline casts on UA. Also +RBCs. No ketonuria. - Hold lisinopril, lasix - Continue IVF with LR, not overloaded at this time.  - Trend in AM, avoid nephrotoxins.   Elevated LFTs: Mild, AST > ALT, TBili is wnl. - Trend in AM to determine whether further work up is needed  Thrombocytopenia: Has had low platelets in 2014 and 2020. No bleeding - Recheck with CBC in AM as clumping is noted on smear.  Fall at home: days ago. Nonfocal exam - Will follow up CT head - PT/OT  T2DM: Remote A1c is 7.1%.  - Update HbA1c, start SSI, hold metformin (possible etiology of/contributor to [facility]) - Glargine 24u daily home dose, though reports  some hypoglycemic episodes/symptoms. Will hold this for now.  Coronary calcifications: - Continue ASA  Note medication reconciliation is pending at this time.   Advance Care Planning: DNR, confirmed with patient and spouse. His spouse volunteers that she would like to be cremated.  Consults: None  Family Communication: Spouse at bedside  Severity of Illness: The appropriate patient status for this patient is OBSERVATION. Observation status is judged to be reasonable and necessary in order to provide the required intensity of service to ensure the patient's safety. The patient's presenting symptoms, physical exam findings, and initial radiographic and laboratory data in the context of their medical condition is felt to place them at decreased risk for further clinical deterioration. Furthermore, it is anticipated that the patient will be medically stable for discharge from the hospital within 2 midnights of admission.   Author: Patrecia Pour, MD 07/24/2022 5:35 PM  For on call review [email or web address].

## 2022-07-24 NOTE — ED Triage Notes (Signed)
Per EMS patient called EMS due to body shaking fever chills and body aches. RR 44 with EMS , tachypnea, patient denies CP or pain. EMS states that patient was sating 84% room air. EMS placed patient on 4L 02. HR 140 with EMS

## 2022-07-25 DIAGNOSIS — R0902 Hypoxemia: Secondary | ICD-10-CM | POA: Diagnosis not present

## 2022-07-25 DIAGNOSIS — R311 Benign essential microscopic hematuria: Secondary | ICD-10-CM

## 2022-07-25 DIAGNOSIS — E875 Hyperkalemia: Secondary | ICD-10-CM | POA: Diagnosis present

## 2022-07-25 DIAGNOSIS — Z833 Family history of diabetes mellitus: Secondary | ICD-10-CM | POA: Diagnosis not present

## 2022-07-25 DIAGNOSIS — Z96642 Presence of left artificial hip joint: Secondary | ICD-10-CM | POA: Diagnosis present

## 2022-07-25 DIAGNOSIS — Z87891 Personal history of nicotine dependence: Secondary | ICD-10-CM | POA: Diagnosis not present

## 2022-07-25 DIAGNOSIS — Z79899 Other long term (current) drug therapy: Secondary | ICD-10-CM | POA: Diagnosis not present

## 2022-07-25 DIAGNOSIS — Z8673 Personal history of transient ischemic attack (TIA), and cerebral infarction without residual deficits: Secondary | ICD-10-CM | POA: Diagnosis not present

## 2022-07-25 DIAGNOSIS — Z7982 Long term (current) use of aspirin: Secondary | ICD-10-CM | POA: Diagnosis not present

## 2022-07-25 DIAGNOSIS — J9601 Acute respiratory failure with hypoxia: Secondary | ICD-10-CM | POA: Diagnosis present

## 2022-07-25 DIAGNOSIS — Z794 Long term (current) use of insulin: Secondary | ICD-10-CM | POA: Diagnosis not present

## 2022-07-25 DIAGNOSIS — Z7985 Long-term (current) use of injectable non-insulin antidiabetic drugs: Secondary | ICD-10-CM | POA: Diagnosis not present

## 2022-07-25 DIAGNOSIS — E872 Acidosis, unspecified: Secondary | ICD-10-CM | POA: Diagnosis present

## 2022-07-25 DIAGNOSIS — E11649 Type 2 diabetes mellitus with hypoglycemia without coma: Secondary | ICD-10-CM | POA: Diagnosis present

## 2022-07-25 DIAGNOSIS — R7989 Other specified abnormal findings of blood chemistry: Secondary | ICD-10-CM | POA: Diagnosis not present

## 2022-07-25 DIAGNOSIS — Z66 Do not resuscitate: Secondary | ICD-10-CM | POA: Diagnosis present

## 2022-07-25 DIAGNOSIS — E8729 Other acidosis: Secondary | ICD-10-CM | POA: Diagnosis not present

## 2022-07-25 DIAGNOSIS — M1612 Unilateral primary osteoarthritis, left hip: Secondary | ICD-10-CM | POA: Diagnosis present

## 2022-07-25 DIAGNOSIS — E119 Type 2 diabetes mellitus without complications: Secondary | ICD-10-CM | POA: Diagnosis not present

## 2022-07-25 DIAGNOSIS — Z1152 Encounter for screening for COVID-19: Secondary | ICD-10-CM | POA: Diagnosis not present

## 2022-07-25 DIAGNOSIS — I2510 Atherosclerotic heart disease of native coronary artery without angina pectoris: Secondary | ICD-10-CM | POA: Diagnosis present

## 2022-07-25 DIAGNOSIS — W19XXXA Unspecified fall, initial encounter: Secondary | ICD-10-CM | POA: Diagnosis not present

## 2022-07-25 DIAGNOSIS — I4510 Unspecified right bundle-branch block: Secondary | ICD-10-CM | POA: Diagnosis not present

## 2022-07-25 DIAGNOSIS — N390 Urinary tract infection, site not specified: Secondary | ICD-10-CM | POA: Diagnosis present

## 2022-07-25 DIAGNOSIS — Y92009 Unspecified place in unspecified non-institutional (private) residence as the place of occurrence of the external cause: Secondary | ICD-10-CM | POA: Diagnosis not present

## 2022-07-25 DIAGNOSIS — Z23 Encounter for immunization: Secondary | ICD-10-CM | POA: Diagnosis present

## 2022-07-25 DIAGNOSIS — D696 Thrombocytopenia, unspecified: Secondary | ICD-10-CM | POA: Diagnosis present

## 2022-07-25 DIAGNOSIS — N179 Acute kidney failure, unspecified: Secondary | ICD-10-CM | POA: Diagnosis present

## 2022-07-25 DIAGNOSIS — I4719 Other supraventricular tachycardia: Secondary | ICD-10-CM | POA: Diagnosis present

## 2022-07-25 DIAGNOSIS — I10 Essential (primary) hypertension: Secondary | ICD-10-CM | POA: Diagnosis present

## 2022-07-25 DIAGNOSIS — W06XXXA Fall from bed, initial encounter: Secondary | ICD-10-CM | POA: Diagnosis present

## 2022-07-25 DIAGNOSIS — B961 Klebsiella pneumoniae [K. pneumoniae] as the cause of diseases classified elsewhere: Secondary | ICD-10-CM | POA: Diagnosis present

## 2022-07-25 LAB — CBC WITH DIFFERENTIAL/PLATELET
Abs Immature Granulocytes: 0.28 10*3/uL — ABNORMAL HIGH (ref 0.00–0.07)
Basophils Absolute: 0.1 10*3/uL (ref 0.0–0.1)
Basophils Relative: 1 %
Eosinophils Absolute: 0.1 10*3/uL (ref 0.0–0.5)
Eosinophils Relative: 1 %
HCT: 34.7 % — ABNORMAL LOW (ref 39.0–52.0)
Hemoglobin: 11.3 g/dL — ABNORMAL LOW (ref 13.0–17.0)
Immature Granulocytes: 3 %
Lymphocytes Relative: 18 %
Lymphs Abs: 1.9 10*3/uL (ref 0.7–4.0)
MCH: 29.4 pg (ref 26.0–34.0)
MCHC: 32.6 g/dL (ref 30.0–36.0)
MCV: 90.1 fL (ref 80.0–100.0)
Monocytes Absolute: 0.9 10*3/uL (ref 0.1–1.0)
Monocytes Relative: 8 %
Neutro Abs: 7.5 10*3/uL (ref 1.7–7.7)
Neutrophils Relative %: 69 %
Platelets: 118 10*3/uL — ABNORMAL LOW (ref 150–400)
RBC: 3.85 MIL/uL — ABNORMAL LOW (ref 4.22–5.81)
RDW: 13.3 % (ref 11.5–15.5)
WBC: 10.7 10*3/uL — ABNORMAL HIGH (ref 4.0–10.5)
nRBC: 0 % (ref 0.0–0.2)

## 2022-07-25 LAB — COMPREHENSIVE METABOLIC PANEL
ALT: 50 U/L — ABNORMAL HIGH (ref 0–44)
AST: 76 U/L — ABNORMAL HIGH (ref 15–41)
Albumin: 2.3 g/dL — ABNORMAL LOW (ref 3.5–5.0)
Alkaline Phosphatase: 76 U/L (ref 38–126)
Anion gap: 9 (ref 5–15)
BUN: 53 mg/dL — ABNORMAL HIGH (ref 8–23)
CO2: 21 mmol/L — ABNORMAL LOW (ref 22–32)
Calcium: 7.6 mg/dL — ABNORMAL LOW (ref 8.9–10.3)
Chloride: 104 mmol/L (ref 98–111)
Creatinine, Ser: 2.26 mg/dL — ABNORMAL HIGH (ref 0.61–1.24)
GFR, Estimated: 29 mL/min — ABNORMAL LOW (ref >60)
Glucose, Bld: 152 mg/dL — ABNORMAL HIGH (ref 70–99)
Potassium: 4.2 mmol/L (ref 3.5–5.1)
Sodium: 134 mmol/L — ABNORMAL LOW (ref 135–145)
Total Bilirubin: 0.8 mg/dL (ref 0.3–1.2)
Total Protein: 5.4 g/dL — ABNORMAL LOW (ref 6.5–8.1)

## 2022-07-25 LAB — GLUCOSE, CAPILLARY
Glucose-Capillary: 168 mg/dL — ABNORMAL HIGH (ref 70–99)
Glucose-Capillary: 270 mg/dL — ABNORMAL HIGH (ref 70–99)

## 2022-07-25 LAB — CBG MONITORING, ED
Glucose-Capillary: 97 mg/dL (ref 70–99)
Glucose-Capillary: 163 mg/dL — ABNORMAL HIGH (ref 70–99)

## 2022-07-25 MED ORDER — SODIUM CHLORIDE 0.9% FLUSH
3.0000 mL | Freq: Two times a day (BID) | INTRAVENOUS | Status: DC
  Administered 2022-07-25 – 2022-07-27 (×4): 3 mL via INTRAVENOUS

## 2022-07-25 MED ORDER — HEPARIN SODIUM (PORCINE) 5000 UNIT/ML IJ SOLN: 5000.0000 [IU] | Freq: Three times a day (TID) | INTRAMUSCULAR | Status: DC

## 2022-07-25 MED ORDER — SODIUM CHLORIDE 0.9 % IV SOLN
2.0000 g | INTRAVENOUS | Status: DC
  Administered 2022-07-25 – 2022-07-26 (×2): 2 g via INTRAVENOUS
  Filled 2022-07-25 (×2): qty 20

## 2022-07-25 MED ORDER — HEPARIN SODIUM (PORCINE) 5000 UNIT/ML IJ SOLN
5000.0000 [IU] | Freq: Two times a day (BID) | INTRAMUSCULAR | Status: DC
  Administered 2022-07-25 – 2022-07-27 (×4): 5000 [IU] via SUBCUTANEOUS
  Filled 2022-07-25 (×4): qty 1

## 2022-07-25 MED ORDER — SODIUM CHLORIDE 0.9 % IV SOLN: 1.0000 g | INTRAVENOUS | Status: DC

## 2022-07-25 NOTE — Progress Notes (Signed)
PHARMACY - PHYSICIAN COMMUNICATION CRITICAL VALUE ALERT - BLOOD CULTURE IDENTIFICATION (BCID)  Eduardo Murray is an 81 y.o. male who presented to [facility] on 07/24/2022  Assessment:  70 yom presenting with acute hypoxia, initially thought to be PNA but less likely per MD based on chest x-ray. Felt to have symptomatic UTI, received ceftriaxone/azithromycin x 1 in the ER. Now with 1 of 4 blood culture bottles growing GPR (no BCID resulted).  Name of physician (or Provider) Contacted: Pokhrel, [place]  Current antibiotics: ceftriaxone for UTI per MD  Changes to prescribed antibiotics recommended:  None - likely contaminant at this point  No results found for this or any previous visit.   [name], PharmD, BCPS Please check AMION for all [facility] contact numbers Clinical Pharmacist 07/25/2022 6:18 PM

## 2022-07-25 NOTE — Plan of Care (Signed)

## 2022-07-25 NOTE — Progress Notes (Signed)
PROGRESS NOTE    EVEN BUDLONG  ZLD:[id number] DOB: 12-20-1940 DOA: 07/24/2022 PCP: Deland Pretty, MD    Brief Narrative:  Eduardo Murray is an 81 y.o. male with past medical history of HTN, diabetes mellitus type 2, remote tobacco use, history pneumonia and pleural effusions presented hospital with chills fever and shaking for 2 to 3 days prior to presentation.  Home test for COVID was negative.  He also complained of generalized weakness, dark urine and burning micturition with frequency.  In the ED he was also noted to be hypoxic and required 4 L of oxygen by nasal cannula initially.  Initial blood gas showed pO2 of 72.  Eduardo Murray data showed a sodium low at 133 with mild hyperkalemia at 5.4.  Creatinine was elevated at 3.1.  Urinalysis showed negative nitrite but leukocytes with positive more than 50 per high-power field.  Influenza and COVID was negative.  Patient did not have leukocytosis.  Patient was then considered for admission to the hospital for further evaluation and treatment.   Assessment and Plan:  Acute hypoxia.  Initially thought to be secondary to pneumonia but chest x-ray not convincing. COVID and influenza test was negative.  Respiratory viral panel negative.  Blood cultures negative in less than 24 hours.    Symptomatic UTI:  Patient presented with fever chills pyuria bacteriuria and urinary symptoms.  Continue IV Rocephin.  Tmax of 102.6 F.  Follow urine culture which is pending.  Renal ultrasound was negative for hydronephrosis.   Multifocal atrial tachycardia:  Improved at this time.  Metoprolol as needed.  Microscopic hematuria:  Likely secondary to UTI.  Continue IV Rocephin.   Hyperkalemia:  Improved.  Received Lokelma.  Latest potassium of 4.2.  Will closely monitor.   AKI with anion gap metabolic acidosis:  Likely prerenal with poor oral intake.  Urine with hyaline cast.  Hold Lasix and lisinopril.  Continue IV fluids.  Initial creatinine at 3.1 creatinine  has trended down to 2.2 today.  Will continue to monitor.   Elevated AST ALT. Will monitor.   Thrombocytopenia: Has had low platelets in 2014 and 2020. No bleeding Mild.  Platelet count today at 118.   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  Get PT OT evaluation.  Diabetes mellitus type 2.   Remote A1c is 7.1%.  Repeat hemoglobin A1c pending.  Hold metformin.  On glargine 24 units at nighttime at home.  Currently on hold.  Continue sliding scale insulin for now.  Reintroduce long-acting if sugar uncontrolled..   Coronary calcifications: - Continue ASA    DVT prophylaxis:  Will add heparin subcu  Code Status:     Code Status: DNR  Disposition: Home likely in 1 to 2 days  Status is: Observation  The patient will require care spanning > 2 midnights and should be moved to inpatient because: IV antibiotic, IV fluids, acute kidney injury,   Family Communication: Spoke with the patient's wife at bedside.  Consultants:  None  Procedures:  None  Antimicrobials:  Rocephin IV 11/28>  Anti-infectives (From admission, onward)    Start     Dose/Rate Route Frequency Ordered Stop   07/24/22 1545  cefTRIAXone (ROCEPHIN) 1 g in sodium chloride 0.9 % 100 mL IVPB        1 g 200 mL/hr over 30 Minutes Intravenous  Once 07/24/22 1543 07/24/22 1629   07/24/22 1545  azithromycin (ZITHROMAX) 500 mg  in sodium chloride 0.9 % 250 mL IVPB        500 mg 250 mL/hr over 60 Minutes Intravenous  Once 07/24/22 1543 07/24/22 1806      Subjective: Today, patient was seen and examined at bedside.  Patient's wife at bedside.  Patient complains of mild lower abdomen/groin area pain.  Denies any nausea vomiting fever chills or rigor.  Denies any shortness of breath or chest pain.  Has had mild cough.  Feels hungry.  Objective: Vitals:   07/25/22 0518 07/25/22 0545 07/25/22 0630 07/25/22 0930  BP:  (!) 153/85 (!) 157/96  (!) 162/82  Pulse:  84 83 89  Resp:  '16 19 16  '$ Temp: (!) 97.5 F (36.4 C)     TempSrc: Oral     SpO2:  98% 97% 96%    Intake/Output Summary (Last 24 hours) at 07/25/2022 1023 Last data filed at 07/24/2022 2200 Gross per 24 hour  Intake 1344.71 ml  Output --  Net 1344.71 ml   There were no vitals filed for this visit.  Physical Examination: There is no height or weight on file to calculate BMI.   General:  Average built, not in obvious distress, hard of hearing HENT:   No scleral pallor or icterus noted. Oral mucosa is moist.  Chest:  Clear breath sounds.  Diminished breath sounds bilaterally. No crackles or wheezes.  CVS: S1 &S2 heard. No murmur.  Regular rate and rhythm. Abdomen: Soft, nontender, nondistended.  Bowel sounds are heard.   Extremities: No cyanosis, clubbing or edema.  Peripheral pulses are palpable.  Mild tenderness over the right groin area. Psych: Alert, awake and oriented, normal mood CNS:  No cranial nerve deficits.  Power equal in all extremities.   Skin: Warm and dry.  No rashes noted.  Ecchymosis present on the skin.  Data Reviewed:   CBC: Recent Labs  Lab 07/24/22 1548 07/24/22 1623 07/24/22 1624 07/25/22 0500  WBC 6.7  --   --  10.7*  NEUTROABS 6.4  --   --  7.5  HGB 12.6* 13.3 12.6* 11.3*  HCT 39.4 39.0 37.0* 34.7*  MCV 91.8  --   --  90.1  PLT 126*  --   --  118*    Basic Metabolic Panel: Recent Labs  Lab 07/24/22 1548 07/24/22 1623 07/24/22 1624 07/25/22 0500  NA 135 133* 132* 134*  K 5.5* 5.4* 5.4* 4.2  CL 101 105  --  104  CO2 15*  --   --  21*  GLUCOSE 142* 139*  --  152*  BUN 64* 57*  --  53*  CREATININE 2.98* 3.10*  --  2.26*  CALCIUM 8.3*  --   --  7.6*    Liver Function Tests: Recent Labs  Lab 07/24/22 1548 07/25/22 0500  AST 102* 76*  ALT 63* 50*  ALKPHOS 195* 76  BILITOT 1.0 0.8  PROT 6.1* 5.4*  ALBUMIN 2.8* 2.3*     Radiology Studies: US RENAL  Result Date: 07/24/2022 CLINICAL DATA:  Acute kidney  injury, hematuria EXAM: RENAL / URINARY TRACT ULTRASOUND COMPLETE COMPARISON:  None Available. FINDINGS: Right Kidney: Renal measurements: 11.1 x 6.3 x 4.8 cm = volume: 175 mL. Echogenicity within normal limits. No mass or hydronephrosis visualized. Left Kidney: Renal measurements: 11.4 x 5.8 x 4.3 cm = volume: 149 mL. Echogenicity within normal limits. No mass or hydronephrosis visualized. Bladder: Appears normal for degree of bladder distention. Other: None. IMPRESSION: Negative. No hydronephrosis. Electronically Signed  By: Lucrezia Europe M.D.   On: 07/24/2022 20:05   CT Head Wo Contrast  Result Date: 07/24/2022 CLINICAL DATA:  Head trauma, minor (Age >= 65y).  Code sepsis. EXAM: CT HEAD WITHOUT CONTRAST TECHNIQUE: Contiguous axial images were obtained from the base of the skull through the vertex without intravenous contrast. RADIATION DOSE REDUCTION: This exam was performed according to the departmental dose-optimization program which includes automated exposure control, adjustment of the mA and/or kV according to patient size and/or use of iterative reconstruction technique. COMPARISON:  None Available. FINDINGS: Brain: Normal anatomic configuration. Parenchymal volume loss is commensurate with the patient's age. Remote lacunar infarct noted within the right insular cortex. No abnormal intra or extra-axial mass lesion or fluid collection. No abnormal mass effect or midline shift. No evidence of acute intracranial hemorrhage or infarct. Ventricular size is normal. Cerebellum unremarkable. Vascular: No asymmetric hyperdense vasculature at the skull base. Skull: Intact Sinuses/Orbits: Paranasal sinuses are clear. Orbits are unremarkable. Other: Mastoid air cells and middle ear cavities are clear. IMPRESSION: 1. No acute intracranial abnormality. No calvarial fracture. 2. 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 Electronically Signed   By: Fidela Salisbury M.D.   On: 07/24/2022 18:35   DG Chest  Port 1 View  Result Date: 07/24/2022 CLINICAL DATA:  Shortness of breath since Saturday. Hypertension and diabetes. EXAM: PORTABLE CHEST 1 VIEW COMPARISON:  06/04/2020 and chest CT from 01/20/2021 FINDINGS: Low lung volumes are present, causing crowding of the pulmonary vasculature. Blunting of both lateral costophrenic angles. Atherosclerotic calcification of the aortic arch. Borderline enlargement of the cardiopericardial silhouette, without overt edema. Thoracic spondylosis. IMPRESSION: 1. Borderline enlargement of the cardiopericardial silhouette, without edema. 2. Blunting of both lateral costophrenic angles, favoring scarring over small bilateral pleural effusions. 3. Thoracic spondylosis. 4. Low lung volumes. Electronically Signed   By: Van Clines M.D.   On: 07/24/2022 16:05      LOS: 0 days     Flora Lipps, MD [facility] Available via Epic secure chat 7am-7pm After these hours, please refer to coverage provider listed on [email or web address] 07/25/2022, 10:23 AM

## 2022-07-26 DIAGNOSIS — N179 Acute kidney failure, unspecified: Secondary | ICD-10-CM | POA: Diagnosis not present

## 2022-07-26 DIAGNOSIS — J9601 Acute respiratory failure with hypoxia: Secondary | ICD-10-CM | POA: Diagnosis not present

## 2022-07-26 DIAGNOSIS — Z66 Do not resuscitate: Secondary | ICD-10-CM | POA: Diagnosis not present

## 2022-07-26 DIAGNOSIS — R7989 Other specified abnormal findings of blood chemistry: Secondary | ICD-10-CM | POA: Diagnosis not present

## 2022-07-26 LAB — BLOOD CULTURE ID PANEL (REFLEXED) - BCID2

## 2022-07-26 LAB — BASIC METABOLIC PANEL
Anion gap: 8 (ref 5–15)
BUN: 36 mg/dL — ABNORMAL HIGH (ref 8–23)
CO2: 22 mmol/L (ref 22–32)
Calcium: 8.1 mg/dL — ABNORMAL LOW (ref 8.9–10.3)
Chloride: 109 mmol/L (ref 98–111)
Creatinine, Ser: 1.77 mg/dL — ABNORMAL HIGH (ref 0.61–1.24)
GFR, Estimated: 38 mL/min — ABNORMAL LOW (ref >60)
Glucose, Bld: 92 mg/dL (ref 70–99)
Potassium: 4.1 mmol/L (ref 3.5–5.1)
Sodium: 139 mmol/L (ref 135–145)

## 2022-07-26 LAB — CBC
HCT: 34.7 % — ABNORMAL LOW (ref 39.0–52.0)
Hemoglobin: 11.5 g/dL — ABNORMAL LOW (ref 13.0–17.0)
MCH: 29.6 pg (ref 26.0–34.0)
MCHC: 33.1 g/dL (ref 30.0–36.0)
MCV: 89.2 fL (ref 80.0–100.0)
Platelets: 124 10*3/uL — ABNORMAL LOW (ref 150–400)
RBC: 3.89 MIL/uL — ABNORMAL LOW (ref 4.22–5.81)
RDW: 13.1 % (ref 11.5–15.5)
WBC: 5.9 10*3/uL (ref 4.0–10.5)
nRBC: 0 % (ref 0.0–0.2)

## 2022-07-26 LAB — HEMOGLOBIN A1C
Hgb A1c MFr Bld: 8.2 % — ABNORMAL HIGH (ref 4.8–5.6)
Mean Plasma Glucose: 189 mg/dL

## 2022-07-26 LAB — MAGNESIUM: Magnesium: 1.3 mg/dL — ABNORMAL LOW (ref 1.7–2.4)

## 2022-07-26 LAB — GLUCOSE, CAPILLARY
Glucose-Capillary: 128 mg/dL — ABNORMAL HIGH (ref 70–99)
Glucose-Capillary: 237 mg/dL — ABNORMAL HIGH (ref 70–99)
Glucose-Capillary: 210 mg/dL — ABNORMAL HIGH (ref 70–99)
Glucose-Capillary: 126 mg/dL — ABNORMAL HIGH (ref 70–99)

## 2022-07-26 MED ORDER — TRAZODONE HCL 50 MG PO TABS
25.0000 mg | ORAL_TABLET | Freq: Every evening | ORAL | Status: DC | PRN
  Administered 2022-07-26: 25 mg via ORAL
  Filled 2022-07-26: qty 1

## 2022-07-26 MED ORDER — MAGNESIUM SULFATE 2 GM/50ML IV SOLN
2.0000 g | Freq: Once | INTRAVENOUS | Status: AC
  Administered 2022-07-26: 2 g via INTRAVENOUS
  Filled 2022-07-26: qty 50

## 2022-07-26 MED ORDER — MAGNESIUM OXIDE -MG SUPPLEMENT 400 (240 MG) MG PO TABS
400.0000 mg | ORAL_TABLET | Freq: Two times a day (BID) | ORAL | Status: DC
  Administered 2022-07-26 – 2022-07-27 (×3): 400 mg via ORAL
  Filled 2022-07-26 (×3): qty 1

## 2022-07-26 MED ORDER — INFLUENZA VAC A&B SA ADJ QUAD 0.5 ML IM PRSY
0.5000 mL | PREFILLED_SYRINGE | INTRAMUSCULAR | Status: AC
  Administered 2022-07-27: 0.5 mL via INTRAMUSCULAR
  Filled 2022-07-26: qty 0.5

## 2022-07-26 NOTE — Plan of Care (Signed)
  Problem: Coping: Goal: Ability to adjust to condition or change in health will improve Outcome: Progressing   Problem: Metabolic: Goal: Ability to maintain appropriate glucose levels will improve Outcome: Progressing

## 2022-07-26 NOTE — Progress Notes (Signed)
Pt was seen for mobility on RW after initially getting up to BR with nursing.  Pt is more capable than anticipated but does need supervised help currently. Per pt, wife is still working and will need to make a way to be with him initially.  Recommending HHPT for follow up care for completion of strengthening and to manage residual fall risk.  Pt is motivated to walk and should make great progress with therapy in his hosp stay to reduce fall risk before going home, and will follow goals of acute PT on POC.  07/26/22 1016  PT Visit Information  Last PT Received On 07/26/22  Assistance Needed +1  History of Present Illness 81 yo male with onset of weakness and burning with urination was admitted for medical management on 11/28.  Pt was given O2, noted acute hypoxia and UTI, AKI with metabolic acidosis, elevated LFT's, thrombocytopenia, elevated K+.  Pt had a fall at home, will see rehab for mobility.  PMHx:  DM, atherosclerosis, stroke,  Precautions  Precautions Fall  Precaution Comments monitor HR and sats  Restrictions  Weight Bearing Restrictions No  Home Living  Family/patient expects to be discharged to: Private residence  Living Arrangements Spouse/significant other  Available Help at Discharge Family;Available PRN/intermittently  Type of Home House  Home Access Stairs to enter  Entrance Stairs-Number of Steps 2  Entrance Stairs-Rails Can reach both  Home Layout One level  Risk manager (2 wheels)  Additional Comments wife is working part of the time per pt  Prior Function  Prior Level of Function  Independent/Modified Independent  Mobility Comments walking on RW or not, depending on his tolerance to move  Communication  Communication No difficulties  Pain Assessment  Pain Assessment No/denies pain  Cognition  Arousal/Alertness Awake/alert  Behavior During Therapy WFL for tasks assessed/performed  Overall Cognitive Status Within Functional  Limits for tasks assessed  General Comments history was given by pt but unclear if he is fully accurate, wife not available  Upper Extremity Assessment  Upper Extremity Assessment Defer to OT evaluation  Lower Extremity Assessment  Lower Extremity Assessment Overall WFL for tasks assessed  Cervical / Trunk Assessment  Cervical / Trunk Assessment Kyphotic (mild change)  Bed Mobility  Overal bed mobility Modified Independent  General bed mobility comments sat up on side of bed with nursing and returned to bed with PT without support  Transfers  Overall transfer level Modified independent  Equipment used Rolling walker (2 wheels)  General transfer comment stood with touch on wall bar in BR  Ambulation/Gait  Ambulation/Gait assistance Min guard  Gait Distance (Feet) 35 Feet  Gait Pattern/deviations Step-to pattern;Step-through pattern;Decreased stride length  General Gait Details slow pace but using walker minimally for support to get to side of bed  Gait velocity reduced  Gait velocity interpretation <1.31 ft/sec, indicative of household ambulator  Pre-gait activities standing balance ck  Balance  Overall balance assessment Needs assistance;History of Falls  Sitting-balance support Feet supported  Sitting balance-Leahy Scale Fair  Standing balance support Bilateral upper extremity supported;During functional activity  Standing balance-Leahy Scale Poor  General Comments  General comments (skin integrity, edema, etc.) pt was seen for progression of mobility on RW, with minor supervised contact to progress to bed and commode.  Pt is unsafe alone but requiring very minimal support to succeed with walker.  Pt is home with wife and should be able to succeed with safe gait  Exercises  Exercises Other  exercises (4+ to 5 LE strength)  PT - End of Session  Equipment Utilized During Treatment Gait belt  Activity Tolerance Patient limited by fatigue  Patient left in bed;with call bell/phone  within reach;with bed alarm set  Nurse Communication Mobility status  PT Assessment  PT Recommendation/Assessment Patient needs continued PT services  PT Visit Diagnosis Muscle weakness (generalized) (M62.81);Difficulty in walking, not elsewhere classified (R26.2);History of falling (Z91.81)  PT Problem List Decreased activity tolerance;Decreased balance;Decreased strength;Decreased mobility;Decreased coordination;Decreased knowledge of use of DME;Decreased safety awareness  PT Plan  PT Frequency (ACUTE ONLY) Min 3X/week  PT Treatment/Interventions (ACUTE ONLY) DME instruction;Gait training;Stair training;Functional mobility training;Therapeutic exercise;Therapeutic activities;Balance training;Neuromuscular re-education;Patient/family education  AM-PAC PT "6 Clicks" Mobility Outcome Measure (Version 2)  Help needed turning from your back to your side while in a flat bed without using bedrails? 3  Help needed moving from lying on your back to sitting on the side of a flat bed without using bedrails? 3  Help needed moving to and from a bed to a chair (including a wheelchair)? 3  Help needed standing up from a chair using your arms (e.g., wheelchair or bedside chair)? 3  Help needed to walk in hospital room? 3  Help needed climbing 3-5 steps with a railing?  2  6 Click Score 17  Consider Recommendation of Discharge To: Home with [facility]  Progressive Mobility  Activity Ambulated with assistance to bathroom  PT Recommendation  Follow Up Recommendations Home health PT  Assistance recommended at discharge Intermittent Supervision/Assistance  Patient can return home with the following A little help with walking and/or transfers;A little help with bathing/dressing/bathroom;Assistance with cooking/housework;Assist for transportation;Help with stairs or ramp for entrance  Functional Status Assessment Patient has had a recent decline in their functional status and demonstrates the ability to make significant  improvements in function in a reasonable and predictable amount of time.  PT equipment None recommended by PT  Individuals Consulted  Consulted and Agree with Results and Recommendations Patient  Acute Rehab PT Goals  Patient Stated Goal to walk and go home  PT Goal Formulation With patient  Time For Goal Achievement 08/02/22  Potential to Achieve Goals Good  PT Time Calculation  PT Start Time (ACUTE ONLY) 1002  PT Stop Time (ACUTE ONLY) 1028  PT Time Calculation (min) (ACUTE ONLY) 26 min  PT General Charges  $$ ACUTE PT VISIT 1 Visit  PT Evaluation  $PT Eval Moderate Complexity 1 Mod  PT Treatments  $Gait Training 8-22 mins  Written Expression  Dominant Hand Right    Mee Hives, PT PhD Acute Rehab Dept. Number: [place]

## 2022-07-26 NOTE — Progress Notes (Signed)
PROGRESS NOTE    Eduardo Murray  GYJ:[id number] DOB: 1940/10/01 DOA: 07/24/2022 PCP: Deland Pretty, MD    Brief Narrative:  Eduardo Murray is an 81 y.o. male with past medical history of HTN, diabetes mellitus type 2, remote tobacco use, history pneumonia and pleural effusions presented hospital with chills, fever and shaking for 2 to 3 days prior to presentation.  Home test for COVID was negative.  He also complained of generalized weakness, dark urine and burning micturition with frequency.  In the ED, he was also noted to be hypoxic and required 4 L of oxygen by nasal cannula initially.  Initial blood gas showed pO2 of 72.  [name] data showed a sodium low at 133 with mild hyperkalemia at 5.4.  Creatinine was elevated at 3.1.  Urinalysis showed negative nitrite but leukocytes with positive more than 50 per high-power field.  Influenza and COVID was negative.  Patient did not have leukocytosis.  Patient was then considered for admission to the hospital for further evaluation and treatment.   Assessment and Plan:  Acute hypoxia.  Initially thought to be secondary to pneumonia but chest x-ray not convincing. COVID and influenza test was negative.  Respiratory viral panel negative.  Currently off supplemental oxygen.  Symptomatic  UTI with Klebsiella bacteremia:  Patient presented with fever chills, pyuria bacteriuria and urinary symptoms.  Denies further symptoms.  Continue IV Rocephin.  Temperature max of 97.9 F today.  Will follow sensitivity.  Renal ultrasound was negative for hydronephrosis.   Multifocal atrial tachycardia:  Improved at this time.  Metoprolol as needed.  Microscopic hematuria:  Likely secondary to UTI.  Continue IV Rocephin.   Hyperkalemia:  Improved.  Received Lokelma.  Latest potassium of 4.1.  Will closely monitor.   AKI with anion gap metabolic acidosis:  Likely prerenal with poor oral intake.  Urine with hyaline cast.  Hold Lasix and lisinopril.  Continue IV  fluids.  Initial creatinine at 3.1 creatinine has trended down to 1.7 today.  Will continue to monitor.  Discontinue IV fluids.  Encourage oral hydration.   Elevated AST ALT. Will monitor.   Thrombocytopenia: Has had low platelets in 2014 and 2020. No bleeding Mild.  Platelet count today at 124.   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  Get PT OT evaluation.  Pending  Diabetes mellitus type 2.   Patient hemoglobin A1c of 8.2.  On dulaglutide and metformin at home.  O Currently on hold.  Continue sliding scale insulin for now.     Coronary calcifications: - Continue ASA    DVT prophylaxis: heparin injection 5,000 Units Start: 07/25/22 1500 Will add heparin subcu  Code Status:     Code Status: DNR  Disposition: Home likely in 1 to 2 days  Status is: Inpatient  The patient is inpatient because: IV antibiotic,  acute kidney injury, Klebsiella bacteremia   Family Communication: Spoke with the patient's wife on the phone and updated her about the medical condition of the patient  Consultants:  None  Procedures:  None  Antimicrobials:  Rocephin IV 11/28>  Anti-infectives (From admission, onward)    Start     Dose/Rate Route Frequency Ordered Stop   07/25/22 1915  cefTRIAXone (ROCEPHIN) 1 g in sodium chloride 0.9 % 100 mL IVPB  Status:  Discontinued        1 g 200 mL/hr over 30 Minutes Intravenous Every 24 hours 07/25/22 1818  07/25/22 1819   07/25/22 1915  cefTRIAXone (ROCEPHIN) 2 g in sodium chloride 0.9 % 100 mL IVPB        2 g 200 mL/hr over 30 Minutes Intravenous Every 24 hours 07/25/22 1819     07/24/22 1545  cefTRIAXone (ROCEPHIN) 1 g in sodium chloride 0.9 % 100 mL IVPB        1 g 200 mL/hr over 30 Minutes Intravenous  Once 07/24/22 1543 07/24/22 1629   07/24/22 1545  azithromycin (ZITHROMAX) 500 mg in sodium chloride 0.9 % 250 mL IVPB        500 mg 250 mL/hr over 60 Minutes  Intravenous  Once 07/24/22 1543 07/24/22 1806      Subjective: Today, patient was seen and examined at bedside.  Patient denies any urinary urgency, frequency dysuria fever or chills.  Feels better and wishes to eat.  No cough congestion or chest pain.   Objective: Vitals:   07/25/22 2300 07/26/22 0402 07/26/22 0800 07/26/22 1126  BP: (!) 175/82 (!) 144/82 (!) 169/87 (!) 166/82  Pulse: 84 73 72 84  Resp: 20 (!) '21 20 19  '$ Temp: 97.7 F (36.5 C) 97.7 F (36.5 C) 97.8 F (36.6 C) 97.7 F (36.5 C)  TempSrc: Oral Oral Oral Oral  SpO2: 95% 96% 96% 96%    Intake/Output Summary (Last 24 hours) at 07/26/2022 1155 Last data filed at 07/26/2022 1127 Gross per 24 hour  Intake 240 ml  Output 1680 ml  Net -1440 ml    There were no vitals filed for this visit.  Physical Examination: There is no height or weight on file to calculate BMI.   General:  Average built, not in obvious distress, hard of hearing HENT:   No scleral pallor or icterus noted. Oral mucosa is moist.  Chest:  Clear breath sounds.   No crackles or wheezes.  CVS: S1 &S2 heard. No murmur.  Regular rate and rhythm. Abdomen: Soft, nontender, nondistended.  Bowel sounds are heard.  No abdominal wall tenderness. Extremities: No cyanosis, clubbing or edema.  Peripheral pulses are palpable.  Psych: Alert, awake and oriented, normal mood CNS:  No cranial nerve deficits.  Moves all extremities. Skin: Warm and dry.  No rashes noted.  Ecchymosis present on the skin.  Data Reviewed:   CBC: Recent Labs  Lab 07/24/22 1548 07/24/22 1623 07/24/22 1624 07/25/22 0500 07/26/22 0433  WBC 6.7  --   --  10.7* 5.9  NEUTROABS 6.4  --   --  7.5  --   HGB 12.6* 13.3 12.6* 11.3* 11.5*  HCT 39.4 39.0 37.0* 34.7* 34.7*  MCV 91.8  --   --  90.1 89.2  PLT 126*  --   --  118* 124*     Basic Metabolic Panel: Recent Labs  Lab 07/24/22 1548 07/24/22 1623 07/24/22 1624 07/25/22 0500 07/26/22 0433  NA 135 133* 132* 134* 139  K  5.5* 5.4* 5.4* 4.2 4.1  CL 101 105  --  104 109  CO2 15*  --   --  21* 22  GLUCOSE 142* 139*  --  152* 92  BUN 64* 57*  --  53* 36*  CREATININE 2.98* 3.10*  --  2.26* 1.77*  CALCIUM 8.3*  --   --  7.6* 8.1*  MG  --   --   --   --  1.3*     Liver Function Tests: Recent Labs  Lab 07/24/22 1548 07/25/22 0500  AST 102* 76*  ALT 63*  50*  ALKPHOS 195* 76  BILITOT 1.0 0.8  PROT 6.1* 5.4*  ALBUMIN 2.8* 2.3*      Radiology Studies: US RENAL  Result Date: 07/24/2022 CLINICAL DATA:  Acute kidney injury, hematuria EXAM: RENAL / URINARY TRACT ULTRASOUND COMPLETE COMPARISON:  None Available. FINDINGS: Right Kidney: Renal measurements: 11.1 x 6.3 x 4.8 cm = volume: 175 mL. Echogenicity within normal limits. No mass or hydronephrosis visualized. Left Kidney: Renal measurements: 11.4 x 5.8 x 4.3 cm = volume: 149 mL. Echogenicity within normal limits. No mass or hydronephrosis visualized. Bladder: Appears normal for degree of bladder distention. Other: None. IMPRESSION: Negative. No hydronephrosis. Electronically Signed   By: Lucrezia Europe M.D.   On: 07/24/2022 20:05   CT Head Wo Contrast  Result Date: 07/24/2022 CLINICAL DATA:  Head trauma, minor (Age >= 65y).  Code sepsis. EXAM: CT HEAD WITHOUT CONTRAST TECHNIQUE: Contiguous axial images were obtained from the base of the skull through the vertex without intravenous contrast. RADIATION DOSE REDUCTION: This exam was performed according to the departmental dose-optimization program which includes automated exposure control, adjustment of the mA and/or kV according to patient size and/or use of iterative reconstruction technique. COMPARISON:  None Available. FINDINGS: Brain: Normal anatomic configuration. Parenchymal volume loss is commensurate with the patient's age. Remote lacunar infarct noted within the right insular cortex. No abnormal intra or extra-axial mass lesion or fluid collection. No abnormal mass effect or midline shift. No evidence of  acute intracranial hemorrhage or infarct. Ventricular size is normal. Cerebellum unremarkable. Vascular: No asymmetric hyperdense vasculature at the skull base. Skull: Intact Sinuses/Orbits: Paranasal sinuses are clear. Orbits are unremarkable. Other: Mastoid air cells and middle ear cavities are clear. IMPRESSION: 1. No acute intracranial abnormality. No calvarial fracture. 2. 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 Electronically Signed   By: Fidela Salisbury M.D.   On: 07/24/2022 18:35   DG Chest Port 1 View  Result Date: 07/24/2022 CLINICAL DATA:  Shortness of breath since Saturday. Hypertension and diabetes. EXAM: PORTABLE CHEST 1 VIEW COMPARISON:  06/04/2020 and chest CT from 01/20/2021 FINDINGS: Low lung volumes are present, causing crowding of the pulmonary vasculature. Blunting of both lateral costophrenic angles. Atherosclerotic calcification of the aortic arch. Borderline enlargement of the cardiopericardial silhouette, without overt edema. Thoracic spondylosis. IMPRESSION: 1. Borderline enlargement of the cardiopericardial silhouette, without edema. 2. Blunting of both lateral costophrenic angles, favoring scarring over small bilateral pleural effusions. 3. Thoracic spondylosis. 4. Low lung volumes. Electronically Signed   By: Van Clines M.D.   On: 07/24/2022 16:05      LOS: 1 day     Flora Lipps, MD [facility] Available via Epic secure chat 7am-7pm After these hours, please refer to coverage provider listed on [email or web address] 07/26/2022, 11:55 AM

## 2022-07-26 NOTE — Progress Notes (Signed)
  Transition of Care (TOC) Screening Note   Patient Details  Name: MCCRAE SPECIALE Date of Birth: 1941-02-07   Transition of Care [facility]) CM/SW Contact:    Cyndi Bender, RN Phone Number: 07/26/2022, 8:50 AM    Transition of Care Department [facility]) has reviewed patient and no TOC needs have been identified at this time. We will continue to monitor patient advancement through interdisciplinary progression rounds. If new patient transition needs arise, please place a TOC consult.

## 2022-07-26 NOTE — TOC Initial Note (Signed)
Transition of Care [facility]) - Initial/Assessment Note    Patient Details  Name: Eduardo Murray MRN: [id number] Date of Birth: 09-Oct-1940  Transition of [facility]) CM/SW Contact:    Cyndi Bender, RN Phone Number: 07/26/2022, 3:30 PM  Clinical Narrative:                 Spoke to patient in regard to home health recommendations.  Patient declines [place] at this time.  Patient states he has all needed DME.  Expected Discharge Plan: [place] Barriers to Discharge: Continued Medical Work up   Patient Goals and CMS Choice Patient states their goals for this hospitalization and ongoing recovery are:: return home      Expected Discharge Plan and Services Expected Discharge Plan: [place]   Discharge Planning Services: CM Consult   Living arrangements for the past 2 months: Single Family Home                                      Prior Living Arrangements/Services Living arrangements for the past 2 months: Single Family Home Lives with:: Spouse Patient language and need for interpreter reviewed:: Yes        Need for Family Participation in Patient Care: Yes (Comment) Care giver support system in place?: Yes (comment) Current home services: DME Criminal Activity/Legal Involvement Pertinent to Current Situation/Hospitalization: No - Comment as needed  Activities of Daily Living      Permission Sought/Granted                  Emotional Assessment   Attitude/Demeanor/Rapport: Engaged Affect (typically observed): Accepting Orientation: : Oriented to Self, Oriented to Place, Oriented to  Time, Oriented to Situation Alcohol / Substance Use: Not Applicable Psych Involvement: No (comment)  Admission diagnosis:  UTI (urinary tract infection) [N39.0] Hypoxia [R09.02] Acute respiratory failure with hypoxia ([place]) [J96.01] AKI (acute kidney injury) ([place]) [N17.9] Fever, unspecified fever cause [R50.9] Patient Active Problem  List   Diagnosis Date Noted   Hypomagnesemia 07/26/2022   Acute respiratory failure with hypoxia ([place]) 07/24/2022   HTN (hypertension) 07/24/2022   UTI (urinary tract infection) 07/24/2022   DNR (do not resuscitate) 07/24/2022   Multifocal atrial tachycardia 07/24/2022   Hematuria 07/24/2022   Hyperkalemia 07/24/2022   AKI (acute kidney injury) ([place]) 07/24/2022   Increased anion gap metabolic acidosis 43/32/9518   Elevated LFTs 07/24/2022   Thrombocytopenia ([place]) 07/24/2022   Fall at home, initial encounter 07/24/2022   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   Loculated pleural effusion 05/30/2020   Sepsis due to pneumonia ([place]) 05/30/2020   Diabetes mellitus with coincident hypertension ([place]) 05/30/2020   Primary localized osteoarthritis of left hip 03/24/2019   Pain in the chest 03/12/2014   Type 2 diabetes mellitus without complications ([place]) 84/16/6063   Sternal fracture 03/12/2014   Right bundle branch block 03/12/2014   PCP:  Deland Pretty, MD Pharmacy:   [id number] [id number] [id number]: [phone]: [phone]    Social Determinants of Health (SDOH) Interventions    Readmission Risk Interventions     No data to display

## 2022-07-26 NOTE — Evaluation (Signed)
Occupational Therapy Evaluation Patient Details Name: Eduardo Murray MRN: [id number] DOB: 1941/05/05 Today's Date: 07/26/2022   History of Present Illness 81 yo male with onset of weakness and burning with urination was admitted for medical management on 11/28.  Pt was given O2, noted acute hypoxia and UTI, AKI with metabolic acidosis, elevated LFT's, thrombocytopenia, elevated K+.  Pt had a fall at home, will see rehab for mobility.  PMHx:  DM, atherosclerosis, stroke.   Clinical Impression   Patient admitted for the diagnosis above.  PTA he lives with his spouse, and remains fairly active.  Patient is mildly unsteady, but appears very close to his baseline.  No significant OT needs identified in the acute setting, and no post acute OT is anticipated.        Recommendations for follow up therapy are one component of a multi-disciplinary discharge planning process, led by the attending physician.  Recommendations may be updated based on patient status, additional functional criteria and insurance authorization.   Follow Up Recommendations  No OT follow up     Assistance Recommended at Discharge PRN  Patient can return home with the following      Functional Status Assessment  Patient has not had a recent decline in their functional status  Equipment Recommendations  None recommended by OT    Recommendations for Other Services       Precautions / Restrictions Precautions Precautions: Fall Precaution Comments: monitor HR and sats Restrictions Weight Bearing Restrictions: No      Mobility Bed Mobility Overal bed mobility: Modified Independent                  Transfers Overall transfer level: Modified independent Equipment used: None                      Balance Overall balance assessment: Mild deficits observed, not formally tested                                         ADL either performed or assessed with clinical judgement    ADL Overall ADL's : At baseline                                             Vision Patient Visual Report: No change from baseline       Perception     Praxis      Pertinent Vitals/Pain Pain Assessment Pain Assessment: No/denies pain     Hand Dominance Right   Extremity/Trunk Assessment Upper Extremity Assessment Upper Extremity Assessment: Overall WFL for tasks assessed   Lower Extremity Assessment Lower Extremity Assessment: Defer to PT evaluation   Cervical / Trunk Assessment Cervical / Trunk Assessment: Kyphotic   Communication Communication Communication: No difficulties   Cognition Arousal/Alertness: Awake/alert Behavior During Therapy: WFL for tasks assessed/performed Overall Cognitive Status: Within Functional Limits for tasks assessed                                       General Comments   O2 sats steady 93-97% on RA    Exercises     Shoulder Instructions      Home Living Family/patient expects to  be discharged to:: Private residence Living Arrangements: Spouse/significant other Available Help at Discharge: Family;Available PRN/intermittently Type of Home: House Home Access: Stairs to enter [employer] of Steps: 2 Entrance Stairs-Rails: Can reach both Home Layout: One level     Bathroom Shower/Tub: Occupational psychologist: Standard Bathroom Accessibility: Yes How Accessible: Accessible via walker Home Equipment: Conservation officer, nature (2 wheels)   Additional Comments: wife is working part time      Prior Functioning/Environment Prior Level of Function : Independent/Modified Independent             Mobility Comments: Occasional use of RW ADLs Comments: Continues to drive, mows his yard, no assist with ADL        OT Problem List: Impaired balance (sitting and/or standing)      OT Treatment/Interventions:      OT Goals(Current goals can be found in the care plan section) Acute  Rehab OT Goals Patient Stated Goal: Return home OT Goal Formulation: With patient Time For Goal Achievement: 07/30/22 Potential to Achieve Goals: Good  OT Frequency:      Co-evaluation              AM-PAC OT "6 Clicks" Daily Activity     Outcome Measure Help from another person eating meals?: None Help from another person taking care of personal grooming?: None Help from another person toileting, which includes using toliet, bedpan, or urinal?: None Help from another person bathing (including washing, rinsing, drying)?: A Little Help from another person to put on and taking off regular upper body clothing?: None Help from another person to put on and taking off regular lower body clothing?: A Little 6 Click Score: 22   End of Session Equipment Utilized During Treatment: Gait belt Nurse Communication: Other (comment) (IV beeping)  Activity Tolerance: Patient tolerated treatment well Patient left: in bed;with call bell/phone within reach  OT Visit Diagnosis: Unsteadiness on feet (R26.81)                Time: 8110-3159 OT Time Calculation (min): 19 min Charges:  OT General Charges $OT Visit: 1 Visit OT Evaluation $OT Eval Moderate Complexity: 1 Mod  07/26/2022  RP, OTR/L  Acute Rehabilitation Services  Office:  [phone]   Metta Clines 07/26/2022, 4:18 PM

## 2022-07-26 NOTE — Progress Notes (Signed)
PHARMACY - PHYSICIAN COMMUNICATION CRITICAL VALUE ALERT - BLOOD CULTURE IDENTIFICATION (BCID)  Eduardo Murray is an 81 y.o. male who presented to [facility] on 07/24/2022 with a chief complaint of UTI   Name of physician (or Provider) Contacted: Dr. Sidney Ace  Current antibiotics: Ceftriaxone 2g IV q24h  Changes to prescribed antibiotics recommended:  Patient is on recommended antibiotics - No changes needed  Results for orders placed or performed during the hospital encounter of 07/24/22  Blood Culture ID Panel (Reflexed) (Collected: 07/24/2022  3:47 PM)  Result Value Ref Range   Enterococcus faecalis NOT DETECTED NOT DETECTED   Enterococcus Faecium NOT DETECTED NOT DETECTED   Listeria monocytogenes NOT DETECTED NOT DETECTED   Staphylococcus species NOT DETECTED NOT DETECTED   Staphylococcus aureus (BCID) NOT DETECTED NOT DETECTED   Staphylococcus epidermidis NOT DETECTED NOT DETECTED   Staphylococcus lugdunensis NOT DETECTED NOT DETECTED   Streptococcus species NOT DETECTED NOT DETECTED   Streptococcus agalactiae NOT DETECTED NOT DETECTED   Streptococcus pneumoniae NOT DETECTED NOT DETECTED   Streptococcus pyogenes NOT DETECTED NOT DETECTED   A.calcoaceticus-baumannii NOT DETECTED NOT DETECTED   Bacteroides fragilis NOT DETECTED NOT DETECTED   Enterobacterales DETECTED (A) NOT DETECTED   Enterobacter cloacae complex NOT DETECTED NOT DETECTED   Escherichia coli NOT DETECTED NOT DETECTED   Klebsiella aerogenes NOT DETECTED NOT DETECTED   Klebsiella oxytoca NOT DETECTED NOT DETECTED   Klebsiella pneumoniae DETECTED (A) NOT DETECTED   Proteus species NOT DETECTED NOT DETECTED   Salmonella species NOT DETECTED NOT DETECTED   Serratia marcescens NOT DETECTED NOT DETECTED   Haemophilus influenzae NOT DETECTED NOT DETECTED   Neisseria meningitidis NOT DETECTED NOT DETECTED   Pseudomonas aeruginosa NOT DETECTED NOT DETECTED   Stenotrophomonas maltophilia NOT DETECTED NOT DETECTED    Candida albicans NOT DETECTED NOT DETECTED   Candida auris NOT DETECTED NOT DETECTED   Candida glabrata NOT DETECTED NOT DETECTED   Candida krusei NOT DETECTED NOT DETECTED   Candida parapsilosis NOT DETECTED NOT DETECTED   Candida tropicalis NOT DETECTED NOT DETECTED   Cryptococcus neoformans/gattii NOT DETECTED NOT DETECTED   CTX-M ESBL NOT DETECTED NOT DETECTED   Carbapenem resistance IMP NOT DETECTED NOT DETECTED   Carbapenem resistance KPC NOT DETECTED NOT DETECTED   Carbapenem resistance NDM NOT DETECTED NOT DETECTED   Carbapenem resist OXA 48 LIKE NOT DETECTED NOT DETECTED   Carbapenem resistance VIM NOT DETECTED NOT DETECTED   Narda Bonds, PharmD, BCPS Clinical Pharmacist Phone: [phone]

## 2022-07-27 DIAGNOSIS — R0902 Hypoxemia: Secondary | ICD-10-CM

## 2022-07-27 DIAGNOSIS — Z66 Do not resuscitate: Secondary | ICD-10-CM | POA: Diagnosis not present

## 2022-07-27 DIAGNOSIS — R7989 Other specified abnormal findings of blood chemistry: Secondary | ICD-10-CM | POA: Diagnosis not present

## 2022-07-27 DIAGNOSIS — I10 Essential (primary) hypertension: Secondary | ICD-10-CM

## 2022-07-27 DIAGNOSIS — N390 Urinary tract infection, site not specified: Secondary | ICD-10-CM | POA: Diagnosis not present

## 2022-07-27 DIAGNOSIS — N179 Acute kidney failure, unspecified: Secondary | ICD-10-CM | POA: Diagnosis not present

## 2022-07-27 LAB — CBC
HCT: 36 % — ABNORMAL LOW (ref 39.0–52.0)
Hemoglobin: 12.1 g/dL — ABNORMAL LOW (ref 13.0–17.0)
MCH: 29.7 pg (ref 26.0–34.0)
MCHC: 33.6 g/dL (ref 30.0–36.0)
MCV: 88.5 fL (ref 80.0–100.0)
Platelets: 133 10*3/uL — ABNORMAL LOW (ref 150–400)
RBC: 4.07 MIL/uL — ABNORMAL LOW (ref 4.22–5.81)
RDW: 13.1 % (ref 11.5–15.5)
WBC: 4.3 10*3/uL (ref 4.0–10.5)
nRBC: 0 % (ref 0.0–0.2)

## 2022-07-27 LAB — BASIC METABOLIC PANEL
Anion gap: 12 (ref 5–15)
BUN: 24 mg/dL — ABNORMAL HIGH (ref 8–23)
CO2: 23 mmol/L (ref 22–32)
Calcium: 8.3 mg/dL — ABNORMAL LOW (ref 8.9–10.3)
Chloride: 101 mmol/L (ref 98–111)
Creatinine, Ser: 1.38 mg/dL — ABNORMAL HIGH (ref 0.61–1.24)
GFR, Estimated: 52 mL/min — ABNORMAL LOW (ref >60)
Glucose, Bld: 202 mg/dL — ABNORMAL HIGH (ref 70–99)
Potassium: 4.1 mmol/L (ref 3.5–5.1)
Sodium: 136 mmol/L (ref 135–145)

## 2022-07-27 LAB — URINE CULTURE: Culture: >=100000 — AB

## 2022-07-27 LAB — CULTURE, BLOOD (ROUTINE X 2): Special Requests: ADEQUATE

## 2022-07-27 LAB — GLUCOSE, CAPILLARY: Glucose-Capillary: 161 mg/dL — ABNORMAL HIGH (ref 70–99)

## 2022-07-27 LAB — MAGNESIUM: Magnesium: 1.3 mg/dL — ABNORMAL LOW (ref 1.7–2.4)

## 2022-07-27 MED ORDER — MAGNESIUM OXIDE -MG SUPPLEMENT 400 (240 MG) MG PO TABS: 400.0000 mg | ORAL_TABLET | Freq: Every day | ORAL | 0 refills | Status: AC

## 2022-07-27 MED ORDER — CEFADROXIL 500 MG PO CAPS: 1000.0000 mg | ORAL_CAPSULE | Freq: Two times a day (BID) | ORAL | Status: DC

## 2022-07-27 MED ORDER — CEFADROXIL 500 MG PO CAPS: 1000.0000 mg | ORAL_CAPSULE | Freq: Two times a day (BID) | ORAL | 0 refills | Status: AC

## 2022-07-27 MED ORDER — MAGNESIUM SULFATE 2 GM/50ML IV SOLN
2.0000 g | Freq: Once | INTRAVENOUS | Status: AC
  Administered 2022-07-27: 2 g via INTRAVENOUS
  Filled 2022-07-27: qty 50

## 2022-07-27 NOTE — Discharge Summary (Signed)
Physician Discharge Summary   Patient: Eduardo Murray MRN: [id number] DOB: January 27, 1941  Admit date:     07/24/2022  Discharge date: 07/27/22  Discharge Physician: Flora Lipps   PCP: Deland Pretty, MD   Recommendations at discharge:   Follow-up with your primary care physician in 1 week. Check CBC BMP magnesium in the next visit.  Discharge Diagnoses: Principal Problem:   UTI (urinary tract infection) Active Problems:   Type 2 diabetes mellitus without complications (HCC)   Right bundle branch block   HTN (hypertension)   DNR (do not resuscitate)   Multifocal atrial tachycardia   Hematuria   AKI (acute kidney injury) ([place])   Thrombocytopenia ([place])   Fall at home, initial encounter   Hypomagnesemia  Resolved Problems:   Hyperkalemia   Increased anion gap metabolic acidosis   Elevated LFTs   Hypoxia  Hospital Course: Eduardo Murray is an 81 y.o. male with past medical history of HTN, diabetes mellitus type 2, remote tobacco use, history pneumonia and pleural effusions presented hospital with chills, fever and shaking for 2 to 3 days prior to presentation.  Home test for COVID was negative.  He also complained of generalized weakness, dark urine and burning micturition with frequency.  In the ED, he was also noted to be hypoxic and required 4 L of oxygen by nasal cannula initially.  Initial blood gas showed pO2 of 72.  Lab data showed a sodium low at 133 with mild hyperkalemia at 5.4.  Creatinine was elevated at 3.1.  Urinalysis showed negative nitrite but leukocytes with positive more than 50 per high-power field.  Influenza and COVID was negative.  Patient did not have leukocytosis.  Patient was then considered for admission to the hospital for further evaluation and treatment.   Following conditions were addressed during hospitalization,  Acute hypoxia.  Initially thought to be secondary to pneumonia but chest x-ray not convincing. COVID and influenza test was negative.   Respiratory viral panel negative.  Currently off supplemental oxygen.  Has resolved.   Symptomatic  UTI with Klebsiella bacteremia:  Patient presented with fever chills, pyuria bacteriuria and urinary symptoms.  Has clinically improved at this time.  Was on IV Rocephin which will be changed to cefadroxil on discharge to complete total 7-day course.  Temperature max of 98.5 F today.    Renal ultrasound was negative for hydronephrosis.   Multifocal atrial tachycardia:  Improved at this time.     Microscopic hematuria:  Likely secondary to UTI.  Continue antibiotic on discharge   Hyperkalemia:  Secondary to AKI.  Has improved with Lokelma.  Potassium of 4.1 today.  Hypomagnesemia.  Magnesium level of 1.3.  Will receive 2 g of magnesium sulfate and will continue magnesium oxide on discharge.  Will need to follow-up with PCP as outpatient.   AKI with anion gap metabolic acidosis:  Likely prerenal with poor oral intake.  Urine with hyaline cast.  Patient received IV fluids during hospitalization.  Initial creatinine at 3.1 creatinine has trended down to 1.3 today.  Continue oral hydration.  Elevated AST ALT. Mild on admission.  Need outpatient follow-up.   Thrombocytopenia: Has had low platelets in 2014 and 2020. No bleeding Mild.  Platelet count today at 133.   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  Has been seen by PT OT recommend home health PT on discharge.   Diabetes mellitus type 2.   Patient hemoglobin  A1c of 8.2.  On dulaglutide and metformin at home.   Resume on discharge.   Coronary calcifications: - Continue ASA  Consultants: None Procedures performed: Renal ultrasound Disposition: Home health Diet recommendation:  Discharge Diet Orders (From admission, onward)     Start     Ordered   07/27/22 0000  Diet Carb Modified        07/27/22 0916           Carb modified diet DISCHARGE  MEDICATION: Allergies as of 07/27/2022   No Known Allergies      Medication List     TAKE these medications    aspirin EC 81 MG tablet Take 81 mg by mouth daily. Swallow whole.   cefadroxil 500 MG capsule Commonly known as: DURICEF Take 2 capsules (1,000 mg total) by mouth 2 (two) times daily for 4 days.   Dulaglutide 3 MG/0.5ML Sopn Inject 3 mg into the skin every Sunday.   lisinopril 40 MG tablet Commonly known as: ZESTRIL Take 40 mg by mouth daily.   magnesium oxide 400 (240 Mg) MG tablet Commonly known as: MAG-OX Take 1 tablet (400 mg total) by mouth daily.   metFORMIN 1000 MG tablet Commonly known as: GLUCOPHAGE Take 1,000 mg by mouth 2 (two) times daily with a meal.   pantoprazole 40 MG tablet Commonly known as: PROTONIX Take 40 mg by mouth daily.   traZODone 50 MG tablet Commonly known as: DESYREL Take 100 mg by mouth at bedtime.       Subjective. Today, patient was seen and examined at bedside.  Denies any urinary urgency, frequency, dysuria, fever, chills or rigor.  No nausea or vomiting.  Discharge Exam:    07/27/2022    8:00 AM 07/26/2022   11:30 PM 07/26/2022    8:27 PM  Vitals with BMI  Systolic [phone]  Diastolic 76 90 89  Pulse 73 80 74    General:  Average built, not in obvious distress, elderly male, hard of hearing HENT:   No scleral pallor or icterus noted. Oral mucosa is moist.  Chest:  Clear breath sounds.  Diminished breath sounds bilaterally. No crackles or wheezes.  CVS: S1 &S2 heard. No murmur.  Regular rate and rhythm. Abdomen: Soft, nontender, nondistended.  Bowel sounds are heard.   Extremities: No cyanosis, clubbing or edema.  Peripheral pulses are palpable. Psych: Alert, awake and oriented, normal mood CNS:  No cranial nerve deficits.  Power equal in all extremities.   Skin: Warm and dry.  No rashes noted.   Condition at discharge: good  The results of significant diagnostics from this hospitalization (including  imaging, microbiology, ancillary and laboratory) are listed below for reference.   Imaging Studies: US RENAL  Result Date: 07/24/2022 CLINICAL DATA:  Acute kidney injury, hematuria EXAM: RENAL / URINARY TRACT ULTRASOUND COMPLETE COMPARISON:  None Available. FINDINGS: Right Kidney: Renal measurements: 11.1 x 6.3 x 4.8 cm = volume: 175 mL. Echogenicity within normal limits. No mass or hydronephrosis visualized. Left Kidney: Renal measurements: 11.4 x 5.8 x 4.3 cm = volume: 149 mL. Echogenicity within normal limits. No mass or hydronephrosis visualized. Bladder: Appears normal for degree of bladder distention. Other: None. IMPRESSION: Negative. No hydronephrosis. Electronically Signed   By: Lucrezia Europe M.D.   On: 07/24/2022 20:05   CT Head Wo Contrast  Result Date: 07/24/2022 CLINICAL DATA:  Head trauma, minor (Age >= 65y).  Code sepsis. EXAM: CT HEAD WITHOUT CONTRAST TECHNIQUE: Contiguous axial images were obtained from the  base of the skull through the vertex without intravenous contrast. RADIATION DOSE REDUCTION: This exam was performed according to the departmental dose-optimization program which includes automated exposure control, adjustment of the mA and/or kV according to patient size and/or use of iterative reconstruction technique. COMPARISON:  None Available. FINDINGS: Brain: Normal anatomic configuration. Parenchymal volume loss is commensurate with the patient's age. Remote lacunar infarct noted within the right insular cortex. No abnormal intra or extra-axial mass lesion or fluid collection. No abnormal mass effect or midline shift. No evidence of acute intracranial hemorrhage or infarct. Ventricular size is normal. Cerebellum unremarkable. Vascular: No asymmetric hyperdense vasculature at the skull base. Skull: Intact Sinuses/Orbits: Paranasal sinuses are clear. Orbits are unremarkable. Other: Mastoid air cells and middle ear cavities are clear. IMPRESSION: 1. No acute intracranial abnormality.  No calvarial fracture. 2. 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 Electronically Signed   By: Fidela Salisbury M.D.   On: 07/24/2022 18:35   DG Chest Port 1 View  Result Date: 07/24/2022 CLINICAL DATA:  Shortness of breath since Saturday. Hypertension and diabetes. EXAM: PORTABLE CHEST 1 VIEW COMPARISON:  06/04/2020 and chest CT from 01/20/2021 FINDINGS: Low lung volumes are present, causing crowding of the pulmonary vasculature. Blunting of both lateral costophrenic angles. Atherosclerotic calcification of the aortic arch. Borderline enlargement of the cardiopericardial silhouette, without overt edema. Thoracic spondylosis. IMPRESSION: 1. Borderline enlargement of the cardiopericardial silhouette, without edema. 2. Blunting of both lateral costophrenic angles, favoring scarring over small bilateral pleural effusions. 3. Thoracic spondylosis. 4. Low lung volumes. Electronically Signed   By: Van Clines M.D.   On: 07/24/2022 16:05    Microbiology: Results for orders placed or performed during the hospital encounter of 07/24/22  Resp Panel by RT-PCR (Flu A&B, Covid) Anterior Nasal Swab     Status: None   Collection Time: 07/24/22  3:34 PM   Specimen: Anterior Nasal Swab  Result Value Ref Range Status   SARS Coronavirus 2 by RT PCR NEGATIVE NEGATIVE Final    Comment: (NOTE) SARS-CoV-2 target nucleic acids are NOT DETECTED.  The SARS-CoV-2 RNA is generally detectable in upper respiratory specimens during the acute phase of infection. The lowest concentration of SARS-CoV-2 viral copies this assay can detect is 138 copies/mL. A negative result does not preclude SARS-Cov-2 infection and should not be used as the sole basis for treatment or other patient management decisions. A negative result may occur with  improper specimen collection/handling, submission of specimen other than nasopharyngeal swab, presence of viral mutation(s) within the areas targeted by this assay, and  inadequate number of viral copies(<138 copies/mL). A negative result must be combined with clinical observations, patient history, and epidemiological information. The expected result is Negative.  Fact Sheet for Patients:  EntrepreneurPulse.com.au  Fact Sheet for Healthcare Providers:  IncredibleEmployment.be  This test is no t yet approved or cleared by the [place] FDA and  has been authorized for detection and/or diagnosis of SARS-CoV-2 by FDA under an Emergency Use Authorization (EUA). This EUA will remain  in effect (meaning this test can be used) for the duration of the COVID-19 declaration under Section 564(b)(1) of the Act, 21 U.S.C.section 360bbb-3(b)(1), unless the authorization is terminated  or revoked sooner.       Influenza A by PCR NEGATIVE NEGATIVE Final   Influenza B by PCR NEGATIVE NEGATIVE Final    Comment: (NOTE) The Xpert Xpress SARS-CoV-2/FLU/RSV plus assay is intended as an aid in the diagnosis of influenza from Nasopharyngeal swab specimens and  should not be used as a sole basis for treatment. Nasal washings and aspirates are unacceptable for Xpert Xpress SARS-CoV-2/FLU/RSV testing.  Fact Sheet for Patients: EntrepreneurPulse.com.au  Fact Sheet for Healthcare Providers: IncredibleEmployment.be  This test is not yet approved or cleared by the [place] FDA and has been authorized for detection and/or diagnosis of SARS-CoV-2 by FDA under an Emergency Use Authorization (EUA). This EUA will remain in effect (meaning this test can be used) for the duration of the COVID-19 declaration under Section 564(b)(1) of the Act, 21 U.S.C. section 360bbb-3(b)(1), unless the authorization is terminated or revoked.  Performed at [facility], [place] [address], [place], [place] [address]   Culture, blood (routine x 2)     Status: Abnormal   Collection Time: 07/24/22  3:47 PM    Specimen: BLOOD  Result Value Ref Range Status   Specimen Description BLOOD SITE NOT SPECIFIED  Final   Special Requests   Final    BOTTLES DRAWN AEROBIC AND ANAEROBIC Blood Culture adequate volume   Culture  Setup Time   Final    GRAM NEGATIVE RODS ANAEROBIC BOTTLE ONLY CRITICAL RESULT CALLED TO, READ BACK BY AND VERIFIED WITH: PHARMD LEDFORD 07/26/22 '@222'$  BY AB Performed at [facility], [place] [address], [place], [place] [address]    Culture KLEBSIELLA PNEUMONIAE (A)  Final   Report Status 07/27/2022 FINAL  Final   Organism ID, Bacteria KLEBSIELLA PNEUMONIAE  Final      Susceptibility   Klebsiella pneumoniae - MIC*    AMPICILLIN RESISTANT Resistant     CEFAZOLIN <=4 SENSITIVE Sensitive     CEFEPIME <=0.12 SENSITIVE Sensitive     CEFTAZIDIME <=1 SENSITIVE Sensitive     CEFTRIAXONE <=0.25 SENSITIVE Sensitive     CIPROFLOXACIN <=0.25 SENSITIVE Sensitive     GENTAMICIN <=1 SENSITIVE Sensitive     IMIPENEM <=0.25 SENSITIVE Sensitive     TRIMETH/SULFA <=20 SENSITIVE Sensitive     AMPICILLIN/SULBACTAM <=2 SENSITIVE Sensitive     PIP/TAZO <=4 SENSITIVE Sensitive     * KLEBSIELLA PNEUMONIAE  Blood Culture ID Panel (Reflexed)     Status: Abnormal   Collection Time: 07/24/22  3:47 PM  Result Value Ref Range Status   Enterococcus faecalis NOT DETECTED NOT DETECTED Final   Enterococcus Faecium NOT DETECTED NOT DETECTED Final   Listeria monocytogenes NOT DETECTED NOT DETECTED Final   Staphylococcus species NOT DETECTED NOT DETECTED Final   Staphylococcus aureus (BCID) NOT DETECTED NOT DETECTED Final   Staphylococcus epidermidis NOT DETECTED NOT DETECTED Final   Staphylococcus lugdunensis NOT DETECTED NOT DETECTED Final   Streptococcus species NOT DETECTED NOT DETECTED Final   Streptococcus agalactiae NOT DETECTED NOT DETECTED Final   Streptococcus pneumoniae NOT DETECTED NOT DETECTED Final   Streptococcus pyogenes NOT DETECTED NOT DETECTED Final   A.calcoaceticus-baumannii NOT  DETECTED NOT DETECTED Final   Bacteroides fragilis NOT DETECTED NOT DETECTED Final   Enterobacterales DETECTED (A) NOT DETECTED Final    Comment: Enterobacterales represent a large order of gram negative bacteria, not a single organism. CRITICAL RESULT CALLED TO, READ BACK BY AND VERIFIED WITH: PHARMD LEDFORD 07/26/22 '@222'$  BY AB    Enterobacter cloacae complex NOT DETECTED NOT DETECTED Final   Escherichia coli NOT DETECTED NOT DETECTED Final   Klebsiella aerogenes NOT DETECTED NOT DETECTED Final   Klebsiella oxytoca NOT DETECTED NOT DETECTED Final   Klebsiella pneumoniae DETECTED (A) NOT DETECTED Final    Comment: CRITICAL RESULT CALLED TO, READ BACK BY AND VERIFIED  WITH: PHARMD LEDFORD 07/26/22 '@222'$  BY AB    Proteus species NOT DETECTED NOT DETECTED Final   Salmonella species NOT DETECTED NOT DETECTED Final   Serratia marcescens NOT DETECTED NOT DETECTED Final   Haemophilus influenzae NOT DETECTED NOT DETECTED Final   Neisseria meningitidis NOT DETECTED NOT DETECTED Final   Pseudomonas aeruginosa NOT DETECTED NOT DETECTED Final   Stenotrophomonas maltophilia NOT DETECTED NOT DETECTED Final   Candida albicans NOT DETECTED NOT DETECTED Final   Candida auris NOT DETECTED NOT DETECTED Final   Candida glabrata NOT DETECTED NOT DETECTED Final   Candida krusei NOT DETECTED NOT DETECTED Final   Candida parapsilosis NOT DETECTED NOT DETECTED Final   Candida tropicalis NOT DETECTED NOT DETECTED Final   Cryptococcus neoformans/gattii NOT DETECTED NOT DETECTED Final   CTX-M ESBL NOT DETECTED NOT DETECTED Final   Carbapenem resistance IMP NOT DETECTED NOT DETECTED Final   Carbapenem resistance KPC NOT DETECTED NOT DETECTED Final   Carbapenem resistance NDM NOT DETECTED NOT DETECTED Final   Carbapenem resist OXA 48 LIKE NOT DETECTED NOT DETECTED Final   Carbapenem resistance VIM NOT DETECTED NOT DETECTED Final    Comment: Performed at [facility], [address] [place], [place]  [address]  Culture, blood (routine x 2)     Status: Abnormal (Preliminary result)   Collection Time: 07/24/22  3:49 PM   Specimen: BLOOD  Result Value Ref Range Status   Specimen Description BLOOD SITE NOT SPECIFIED  Final   Special Requests   Final    BOTTLES DRAWN AEROBIC AND ANAEROBIC Blood Culture adequate volume   Culture  Setup Time   Final    GRAM POSITIVE RODS IN BOTH AEROBIC AND ANAEROBIC BOTTLES CRITICAL RESULT CALLED TO, READ BACK BY AND VERIFIED WITH: PHARMD [place] [id number] '@1617'$  FH    Culture (A)  Final    CORYNEBACTERIUM SPECIES Standardized susceptibility testing for this organism is not available. Performed at [facility], [place] [address], [place], [place] [address]    Report Status PENDING  Incomplete  Urine Culture     Status: Abnormal (Preliminary result)   Collection Time: 07/24/22  4:00 PM   Specimen: Urine, Clean Catch  Result Value Ref Range Status   Specimen Description URINE, CLEAN CATCH  Final   Special Requests NONE  Final   Culture (A)  Final    >=100,000 COLONIES/mL GRAM NEGATIVE RODS IDENTIFICATION AND SUSCEPTIBILITIES TO FOLLOW Performed at [facility], [address] [place],  [address]    Report Status PENDING  Incomplete  Respiratory (~20 pathogens) panel by PCR     Status: None   Collection Time: 07/24/22  5:31 PM   Specimen: Nasopharyngeal Swab; Respiratory  Result Value Ref Range Status   Adenovirus NOT DETECTED NOT DETECTED Final   Coronavirus 229E NOT DETECTED NOT DETECTED Final    Comment: (NOTE) The Coronavirus on the Respiratory Panel, DOES NOT test for the novel  Coronavirus (2019 nCoV)    Coronavirus HKU1 NOT DETECTED NOT DETECTED Final   Coronavirus NL63 NOT DETECTED NOT DETECTED Final   Coronavirus OC43 NOT DETECTED NOT DETECTED Final   Metapneumovirus NOT DETECTED NOT DETECTED Final   Rhinovirus / Enterovirus NOT DETECTED NOT DETECTED Final   Influenza A NOT DETECTED NOT DETECTED Final   Influenza B NOT  DETECTED NOT DETECTED Final   Parainfluenza Virus 1 NOT DETECTED NOT DETECTED Final   Parainfluenza Virus 2 NOT DETECTED NOT DETECTED Final   Parainfluenza Virus  3 NOT DETECTED NOT DETECTED Final   Parainfluenza Virus 4 NOT DETECTED NOT DETECTED Final   Respiratory Syncytial Virus NOT DETECTED NOT DETECTED Final   Bordetella pertussis NOT DETECTED NOT DETECTED Final   Bordetella Parapertussis NOT DETECTED NOT DETECTED Final   Chlamydophila pneumoniae NOT DETECTED NOT DETECTED Final   Mycoplasma pneumoniae NOT DETECTED NOT DETECTED Final    Comment: Performed at [facility], [place] [address] [place], [place] [address]    Labs: CBC: Recent Labs  Lab 07/24/22 1548 07/24/22 1623 07/24/22 1624 07/25/22 0500 07/26/22 0433 07/27/22 0456  WBC 6.7  --   --  10.7* 5.9 4.3  NEUTROABS 6.4  --   --  7.5  --   --   HGB 12.6* 13.3 12.6* 11.3* 11.5* 12.1*  HCT 39.4 39.0 37.0* 34.7* 34.7* 36.0*  MCV 91.8  --   --  90.1 89.2 88.5  PLT 126*  --   --  118* 124* 494*   Basic Metabolic Panel: Recent Labs  Lab 07/24/22 1548 07/24/22 1623 07/24/22 1624 07/25/22 0500 07/26/22 0433 07/27/22 0456  NA 135 133* 132* 134* 139 136  K 5.5* 5.4* 5.4* 4.2 4.1 4.1  CL 101 105  --  104 109 101  CO2 15*  --   --  21* 22 23  GLUCOSE 142* 139*  --  152* 92 202*  BUN 64* 57*  --  53* 36* 24*  CREATININE 2.98* 3.10*  --  2.26* 1.77* 1.38*  CALCIUM 8.3*  --   --  7.6* 8.1* 8.3*  MG  --   --   --   --  1.3* 1.3*   Liver Function Tests: Recent Labs  Lab 07/24/22 1548 07/25/22 0500  AST 102* 76*  ALT 63* 50*  ALKPHOS 195* 76  BILITOT 1.0 0.8  PROT 6.1* 5.4*  ALBUMIN 2.8* 2.3*   CBG: Recent Labs  Lab 07/26/22 0829 07/26/22 1125 07/26/22 1546 07/26/22 2102 07/27/22 0810  GLUCAP 128* 237* 210* 126* 161*    Discharge time spent: greater than 30 minutes.  Signed: Flora Lipps, MD [facility] 07/27/2022

## 2022-07-27 NOTE — Care Management Important Message (Signed)
Important Message  Patient Details  Name: CHRISTPHOR GROFT MRN: [id number] Date of Birth: 1940-12-17   Medicare Important Message Given:  Yes  Patient left prior to IM delivery will mail to the patient home address.    Hendryx Ricke 07/27/2022, 2:36 PM

## 2022-07-28 LAB — CULTURE, BLOOD (ROUTINE X 2): Special Requests: ADEQUATE

## 2022-08-15 DIAGNOSIS — E1121 Type 2 diabetes mellitus with diabetic nephropathy: Secondary | ICD-10-CM | POA: Diagnosis not present

## 2022-08-15 DIAGNOSIS — Z794 Long term (current) use of insulin: Secondary | ICD-10-CM | POA: Diagnosis not present

## 2022-08-15 DIAGNOSIS — I10 Essential (primary) hypertension: Secondary | ICD-10-CM | POA: Diagnosis not present

## 2022-08-15 DIAGNOSIS — E1165 Type 2 diabetes mellitus with hyperglycemia: Secondary | ICD-10-CM | POA: Diagnosis not present

## 2022-08-15 DIAGNOSIS — E7800 Pure hypercholesterolemia, unspecified: Secondary | ICD-10-CM | POA: Diagnosis not present

## 2022-08-15 DIAGNOSIS — E1169 Type 2 diabetes mellitus with other specified complication: Secondary | ICD-10-CM | POA: Diagnosis not present

## 2022-08-15 DIAGNOSIS — I5032 Chronic diastolic (congestive) heart failure: Secondary | ICD-10-CM | POA: Diagnosis not present

## 2022-08-22 DIAGNOSIS — E1169 Type 2 diabetes mellitus with other specified complication: Secondary | ICD-10-CM | POA: Diagnosis not present

## 2022-08-22 DIAGNOSIS — Z794 Long term (current) use of insulin: Secondary | ICD-10-CM | POA: Diagnosis not present

## 2022-11-13 DIAGNOSIS — E7800 Pure hypercholesterolemia, unspecified: Secondary | ICD-10-CM | POA: Diagnosis not present

## 2022-11-13 DIAGNOSIS — E1121 Type 2 diabetes mellitus with diabetic nephropathy: Secondary | ICD-10-CM | POA: Diagnosis not present

## 2022-11-13 DIAGNOSIS — I10 Essential (primary) hypertension: Secondary | ICD-10-CM | POA: Diagnosis not present

## 2022-11-13 DIAGNOSIS — N1831 Chronic kidney disease, stage 3a: Secondary | ICD-10-CM | POA: Diagnosis not present

## 2022-11-13 DIAGNOSIS — E1165 Type 2 diabetes mellitus with hyperglycemia: Secondary | ICD-10-CM | POA: Diagnosis not present

## 2022-11-13 DIAGNOSIS — E1169 Type 2 diabetes mellitus with other specified complication: Secondary | ICD-10-CM | POA: Diagnosis not present

## 2022-11-16 DIAGNOSIS — E7800 Pure hypercholesterolemia, unspecified: Secondary | ICD-10-CM | POA: Diagnosis not present

## 2022-11-21 DIAGNOSIS — N400 Enlarged prostate without lower urinary tract symptoms: Secondary | ICD-10-CM | POA: Diagnosis not present

## 2022-11-21 DIAGNOSIS — I4510 Unspecified right bundle-branch block: Secondary | ICD-10-CM | POA: Diagnosis not present

## 2022-11-21 DIAGNOSIS — Z0000 Encounter for general adult medical examination without abnormal findings: Secondary | ICD-10-CM | POA: Diagnosis not present

## 2022-11-21 DIAGNOSIS — I10 Essential (primary) hypertension: Secondary | ICD-10-CM | POA: Diagnosis not present

## 2022-11-21 DIAGNOSIS — I5032 Chronic diastolic (congestive) heart failure: Secondary | ICD-10-CM | POA: Diagnosis not present

## 2022-11-21 DIAGNOSIS — N1831 Chronic kidney disease, stage 3a: Secondary | ICD-10-CM | POA: Diagnosis not present

## 2022-11-21 DIAGNOSIS — Z23 Encounter for immunization: Secondary | ICD-10-CM | POA: Diagnosis not present

## 2022-11-21 DIAGNOSIS — E1121 Type 2 diabetes mellitus with diabetic nephropathy: Secondary | ICD-10-CM | POA: Diagnosis not present

## 2023-02-19 DIAGNOSIS — E1121 Type 2 diabetes mellitus with diabetic nephropathy: Secondary | ICD-10-CM | POA: Diagnosis not present

## 2023-02-19 DIAGNOSIS — N1831 Chronic kidney disease, stage 3a: Secondary | ICD-10-CM | POA: Diagnosis not present

## 2023-02-19 DIAGNOSIS — E1165 Type 2 diabetes mellitus with hyperglycemia: Secondary | ICD-10-CM | POA: Diagnosis not present

## 2023-02-19 DIAGNOSIS — I10 Essential (primary) hypertension: Secondary | ICD-10-CM | POA: Diagnosis not present

## 2023-06-21 DIAGNOSIS — E1165 Type 2 diabetes mellitus with hyperglycemia: Secondary | ICD-10-CM | POA: Diagnosis not present

## 2023-06-28 DIAGNOSIS — N1831 Chronic kidney disease, stage 3a: Secondary | ICD-10-CM | POA: Diagnosis not present

## 2023-06-28 DIAGNOSIS — E1165 Type 2 diabetes mellitus with hyperglycemia: Secondary | ICD-10-CM | POA: Diagnosis not present

## 2023-06-28 DIAGNOSIS — I10 Essential (primary) hypertension: Secondary | ICD-10-CM | POA: Diagnosis not present

## 2023-10-28 DIAGNOSIS — N1831 Chronic kidney disease, stage 3a: Secondary | ICD-10-CM | POA: Diagnosis not present

## 2023-10-28 DIAGNOSIS — E7800 Pure hypercholesterolemia, unspecified: Secondary | ICD-10-CM | POA: Diagnosis not present

## 2023-10-28 DIAGNOSIS — I10 Essential (primary) hypertension: Secondary | ICD-10-CM | POA: Diagnosis not present

## 2023-10-28 DIAGNOSIS — E1165 Type 2 diabetes mellitus with hyperglycemia: Secondary | ICD-10-CM | POA: Diagnosis not present

## 2023-11-21 DIAGNOSIS — I10 Essential (primary) hypertension: Secondary | ICD-10-CM | POA: Diagnosis not present

## 2023-11-21 DIAGNOSIS — E7800 Pure hypercholesterolemia, unspecified: Secondary | ICD-10-CM | POA: Diagnosis not present

## 2023-11-26 DIAGNOSIS — I5032 Chronic diastolic (congestive) heart failure: Secondary | ICD-10-CM | POA: Diagnosis not present

## 2023-11-26 DIAGNOSIS — I10 Essential (primary) hypertension: Secondary | ICD-10-CM | POA: Diagnosis not present

## 2023-11-26 DIAGNOSIS — Z23 Encounter for immunization: Secondary | ICD-10-CM | POA: Diagnosis not present

## 2023-11-26 DIAGNOSIS — Z0000 Encounter for general adult medical examination without abnormal findings: Secondary | ICD-10-CM | POA: Diagnosis not present

## 2023-11-26 DIAGNOSIS — E1121 Type 2 diabetes mellitus with diabetic nephropathy: Secondary | ICD-10-CM | POA: Diagnosis not present

## 2023-11-26 DIAGNOSIS — F5104 Psychophysiologic insomnia: Secondary | ICD-10-CM | POA: Diagnosis not present

## 2023-11-26 DIAGNOSIS — R413 Other amnesia: Secondary | ICD-10-CM | POA: Diagnosis not present

## 2023-11-26 DIAGNOSIS — I4510 Unspecified right bundle-branch block: Secondary | ICD-10-CM | POA: Diagnosis not present

## 2023-11-26 DIAGNOSIS — R600 Localized edema: Secondary | ICD-10-CM | POA: Diagnosis not present

## 2023-11-26 DIAGNOSIS — E113292 Type 2 diabetes mellitus with mild nonproliferative diabetic retinopathy without macular edema, left eye: Secondary | ICD-10-CM | POA: Diagnosis not present

## 2024-01-30 ENCOUNTER — Ambulatory Visit: Admitting: Podiatry

## 2024-01-30 DIAGNOSIS — E119 Type 2 diabetes mellitus without complications: Secondary | ICD-10-CM

## 2024-01-30 NOTE — Progress Notes (Signed)
 Subjective: Eduardo Murray presents today referred by Imelda Man, MD for diabetic foot evaluation.  Patient relates many year history of diabetes.  Patient denies any history of foot wounds.  Patient denies any history of numbness, tingling, burning, pins/needles sensations.  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   Hematuria 07/24/2022   AKI (acute kidney injury) (HCC) 07/24/2022   Thrombocytopenia (HCC) 07/24/2022   Fall at home, initial encounter 07/24/2022   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   Loculated pleural effusion 05/30/2020   Sepsis due to pneumonia (HCC) 05/30/2020   Diabetes mellitus with coincident hypertension (HCC) 05/30/2020   Primary localized osteoarthritis of left hip 03/24/2019   Pain in the chest 03/12/2014   Type 2 diabetes mellitus without complications (HCC) 03/12/2014   Sternal fracture 03/12/2014   Right bundle branch block 03/12/2014    Past Surgical History:  Procedure Laterality Date   right elbow surgery     right hand surgery     ROTATOR CUFF REPAIR Left    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 Swallow whole.     Dulaglutide  3 MG/0.5ML SOPN Inject 3 mg into the skin every Sunday.      lisinopril  (ZESTRIL ) 40 MG tablet Take 40 mg by mouth daily.     metFORMIN  (GLUCOPHAGE ) 1000 MG tablet Take 1,000 mg by mouth 2 (two) times daily with a meal.     pantoprazole  (PROTONIX ) 40 MG tablet Take 40 mg by mouth daily.      traZODone  (DESYREL ) 50 MG tablet Take 100 mg by mouth at bedtime.     No current facility-administered medications on file prior to visit.     No Known Allergies  Social History   Occupational History   Not on file  Tobacco Use   Smoking status: Former    Current packs/day: 0.00    Average packs/day: 1 pack/day for 30.0 years (30.0 ttl pk-yrs)    Types: Cigarettes    Start date: 12/03/1962    Quit date: 12/02/1992    Years since quitting: 31.1   Smokeless tobacco: Former    Types: Snuff    Quit date: 12/03/2007  Vaping Use   Vaping status: Never Used  Substance and Sexual Activity   Alcohol use: No   Drug use: No   Sexual activity: Not Currently    Family History  Problem Relation Age of Onset   Cancer Father    Diabetes Mother    Cancer Sister    Diabetes Sister    Diabetes Brother     Immunization History  Administered Date(s) Administered   Fluad Quad(high Dose 65+) 07/27/2022    Review of systems: Positive Findings in bold print.  Constitutional:  chills,  fatigue, fever, sweats, weight change Communication: Nurse, learning disability, sign Presenter, broadcasting, hand writing, iPad/Android device Head: headaches, head injury Eyes: changes in vision, eye pain, glaucoma, cataracts, macular degeneration, diplopia, glare,  light sensitivity, eyeglasses or contacts, blindness Ears nose mouth throat: hearing impaired, hearing aids,  ringing in ears, deaf, sign language,  vertigo, nosebleeds,  rhinitis,  cold sores, snoring, swollen glands Cardiovascular: HTN, edema, arrhythmia, pacemaker in place, defibrillator in place, chest pain/tightness, chronic anticoagulation, blood clot, heart failure, MI Peripheral Vascular: leg cramps, varicose veins, blood clots,  lymphedema, varicosities Respiratory:  asthma, difficulty breathing, denies congestion, SOB, wheezing, cough, emphysema Gastrointestinal: change in appetite or weight, abdominal pain, constipation, diarrhea, nausea, vomiting, vomiting blood, change in bowel habits, abdominal pain, jaundice, rectal bleeding, hemorrhoids, GERD Genitourinary:  nocturia,  pain on urination, polyuria,  blood in urine, Foley catheter, urinary urgency, ESRD on hemodialysis Musculoskeletal: amputation, cramping, stiff joints, painful joints, decreased joint motion, fractures, OA, gout, hemiplegia, paraplegia, uses cane, wheelchair bound, uses walker, uses rollator Skin: +changes in toenails, color change, dryness, itching, mole changes,  rash, wound(s) Neurological: headaches, numbness in feet, paresthesias in feet, burning in feet, fainting,  seizures, change in speech, migraines, memory problems/poor historian, cerebral palsy, weakness, paralysis, CVA, TIA Endocrine: diabetes, hypothyroidism, hyperthyroidism,  goiter, dry mouth, flushing, heat intolerance, cold intolerance,  excessive thirst, denies polyuria,  nocturia Hematological:  easy bleeding, excessive bleeding, easy bruising, enlarged lymph nodes, on long term blood thinner, history of past transusions Allergy/immunological:  hives, eczema, frequent infections, multiple drug allergies, seasonal allergies, transplant recipient, multiple food allergies Psychiatric:  anxiety, depression, mood disorder, suicidal ideations, hallucinations, insomnia  Objective: There were no vitals filed for this visit. Vascular Examination: Capillary refill time less than 3 seconds x 10 digits.  Dorsalis pedis pulses palpable 2 out of 4.  Posterior tibial pulses palpable 2 out of 4.  Digital hair not present x 10 digits.  Skin temperature gradient WNL b/l.  Dermatological Examination: Skin with normal turgor, texture and tone b/l  Toenails 1-5 b/l discolored, thick,  dystrophic with subungual debris and pain with palpation to nailbeds due to thickness of nails.  Musculoskeletal: Muscle strength 5/5 to all LE muscle groups.  Neurological: Sensation intact with 10 gram monofilament.  Vibratory sensation intact.  Assessment: NIDDM Encounter for diabetic foot examination  Plan: Discussed diabetic foot care principles. Literature dispensed on today. Patient to continue soft, supportive shoe gear daily. Patient to report any pedal injuries to medical professional immediately. Follow up one year. Patient/POA to call should there be a concern in the interim.

## 2024-02-27 DIAGNOSIS — E1165 Type 2 diabetes mellitus with hyperglycemia: Secondary | ICD-10-CM | POA: Diagnosis not present

## 2024-03-05 DIAGNOSIS — I10 Essential (primary) hypertension: Secondary | ICD-10-CM | POA: Diagnosis not present

## 2024-03-05 DIAGNOSIS — E1165 Type 2 diabetes mellitus with hyperglycemia: Secondary | ICD-10-CM | POA: Diagnosis not present

## 2024-03-05 DIAGNOSIS — E113292 Type 2 diabetes mellitus with mild nonproliferative diabetic retinopathy without macular edema, left eye: Secondary | ICD-10-CM | POA: Diagnosis not present

## 2024-03-05 DIAGNOSIS — N1831 Chronic kidney disease, stage 3a: Secondary | ICD-10-CM | POA: Diagnosis not present
